# Patient Record
Sex: Male | Born: 1945 | Race: White | Hispanic: No | Marital: Married | Smoking: Former smoker
Health system: Southern US, Community
[De-identification: ages and names within clinical notes are randomized; demographics above are authoritative.]

## PROBLEM LIST (undated history)

## (undated) DIAGNOSIS — M2141 Flat foot [pes planus] (acquired), right foot: Secondary | ICD-10-CM

## (undated) DIAGNOSIS — Z860100 Personal history of colon polyps, unspecified: Secondary | ICD-10-CM

## (undated) DIAGNOSIS — C4359 Malignant melanoma of other part of trunk: Secondary | ICD-10-CM

## (undated) DIAGNOSIS — Z974 Presence of external hearing-aid: Secondary | ICD-10-CM

## (undated) DIAGNOSIS — M8580 Other specified disorders of bone density and structure, unspecified site: Secondary | ICD-10-CM

## (undated) DIAGNOSIS — Z8719 Personal history of other diseases of the digestive system: Secondary | ICD-10-CM

## (undated) DIAGNOSIS — I341 Nonrheumatic mitral (valve) prolapse: Secondary | ICD-10-CM

## (undated) DIAGNOSIS — I639 Cerebral infarction, unspecified: Secondary | ICD-10-CM

## (undated) DIAGNOSIS — M1990 Unspecified osteoarthritis, unspecified site: Secondary | ICD-10-CM

## (undated) DIAGNOSIS — Q2112 Patent foramen ovale: Secondary | ICD-10-CM

## (undated) DIAGNOSIS — Z9989 Dependence on other enabling machines and devices: Secondary | ICD-10-CM

## (undated) DIAGNOSIS — I340 Nonrheumatic mitral (valve) insufficiency: Secondary | ICD-10-CM

## (undated) DIAGNOSIS — Q242 Cor triatriatum: Secondary | ICD-10-CM

## (undated) DIAGNOSIS — M2142 Flat foot [pes planus] (acquired), left foot: Secondary | ICD-10-CM

## (undated) DIAGNOSIS — G4733 Obstructive sleep apnea (adult) (pediatric): Secondary | ICD-10-CM

## (undated) DIAGNOSIS — Z8639 Personal history of other endocrine, nutritional and metabolic disease: Secondary | ICD-10-CM

## (undated) DIAGNOSIS — Q211 Atrial septal defect: Secondary | ICD-10-CM

## (undated) DIAGNOSIS — H409 Unspecified glaucoma: Secondary | ICD-10-CM

## (undated) DIAGNOSIS — H903 Sensorineural hearing loss, bilateral: Secondary | ICD-10-CM

## (undated) DIAGNOSIS — N400 Enlarged prostate without lower urinary tract symptoms: Secondary | ICD-10-CM

## (undated) HISTORY — DX: Cor triatriatum: Q24.2

## (undated) HISTORY — PX: COLONOSCOPY: SHX174

## (undated) HISTORY — DX: Atrial septal defect: Q21.1

## (undated) HISTORY — PX: TONSILLECTOMY: SUR1361

## (undated) HISTORY — DX: Patent foramen ovale: Q21.12

---

## 1994-12-06 HISTORY — PX: CHOLECYSTECTOMY: SHX55

## 2000-05-10 ENCOUNTER — Encounter: Admission: RE | Admit: 2000-05-10 | Discharge: 2000-05-10 | Payer: Self-pay | Admitting: Family Medicine

## 2000-05-10 ENCOUNTER — Encounter: Payer: Self-pay | Admitting: Family Medicine

## 2001-10-05 ENCOUNTER — Encounter: Payer: Self-pay | Admitting: Emergency Medicine

## 2001-10-05 ENCOUNTER — Emergency Department (HOSPITAL_COMMUNITY): Admission: EM | Admit: 2001-10-05 | Discharge: 2001-10-05 | Payer: Self-pay | Admitting: Emergency Medicine

## 2001-12-06 HISTORY — PX: LASIK: SHX215

## 2002-07-23 ENCOUNTER — Ambulatory Visit (HOSPITAL_COMMUNITY): Admission: RE | Admit: 2002-07-23 | Discharge: 2002-07-23 | Payer: Self-pay | Admitting: General Surgery

## 2002-07-23 ENCOUNTER — Encounter: Payer: Self-pay | Admitting: General Surgery

## 2002-07-30 ENCOUNTER — Ambulatory Visit (HOSPITAL_COMMUNITY): Admission: RE | Admit: 2002-07-30 | Discharge: 2002-07-31 | Payer: Self-pay | Admitting: General Surgery

## 2002-07-30 ENCOUNTER — Encounter (INDEPENDENT_AMBULATORY_CARE_PROVIDER_SITE_OTHER): Payer: Self-pay | Admitting: Specialist

## 2002-12-06 HISTORY — PX: PARATHYROIDECTOMY: SHX19

## 2003-03-11 ENCOUNTER — Encounter: Admission: RE | Admit: 2003-03-11 | Discharge: 2003-03-11 | Payer: Self-pay | Admitting: Family Medicine

## 2003-03-11 ENCOUNTER — Encounter: Payer: Self-pay | Admitting: Family Medicine

## 2005-04-08 ENCOUNTER — Encounter: Admission: RE | Admit: 2005-04-08 | Discharge: 2005-04-08 | Payer: Self-pay | Admitting: Family Medicine

## 2007-04-19 ENCOUNTER — Encounter: Admission: RE | Admit: 2007-04-19 | Discharge: 2007-04-19 | Payer: Self-pay | Admitting: Family Medicine

## 2009-05-06 ENCOUNTER — Encounter: Admission: RE | Admit: 2009-05-06 | Discharge: 2009-05-06 | Payer: Self-pay | Admitting: Family Medicine

## 2011-04-23 NOTE — Op Note (Signed)
NAME:  James Tate, James Tate NO.:  [id number]   MEDICAL RECORD NO.:  [id number]                   PATIENT TYPE:  OIB   LOCATION:  5725                                 FACILITY:  [facility]   PHYSICIAN:  Angelia Mould. Derrell Lolling, M.D.             DATE OF BIRTH:  Apr 25, 1946   DATE OF PROCEDURE:  07/30/2002  DATE OF DISCHARGE:  07/31/2002                                 OPERATIVE REPORT   PREOPERATIVE DIAGNOSIS:  Primary hyperparathyroidism.   POSTOPERATIVE DIAGNOSIS:  Primary hyperparathyroidism.   PROCEDURE:  Minimally-invasive radionuclide-localized parathyroidectomy,  frozen section.   SURGEON:  Angelia Mould. Derrell Lolling, M.D.   ASSISTANT:  Sandria Bales. Ezzard Standing, M.D.   ESTIMATED BLOOD LOSS:  About 30 cc.   COMPLICATIONS:  None.   INDICATIONS:  This is a 65 year old white man who has had progressive  osteoporosis for the past three years, and he has been on Fosamax for the  past three years.  His father and his brother had severe osteoporosis, but  there is no family history of endocrine neoplasia syndrome.  Calcium has  been progressively rising, being 10.9 in 2/02, 11.6 in 3/03, and up to 12.4  on 07/02/02.  Parathormone levels have been significantly elevated, three to  four times normal.  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  He has noted some mild confusion of  right and left but not bad.  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   DESCRIPTION OF PROCEDURE:  Following the induction of general endotracheal  anesthesia, the patient was positioned with his neck extended.  He had  undergone a radionuclide injection 1 hour 15 minutes prior to the surgery.  The neck was prepped and draped in a sterile fashion.   We used the Neoprobe, and we were able to localize increased activity on the  left side slightly superiorly, about twice the background activity.   We  marked our incision and made a transverse incision about two fingerbreadths  above the left clavicle on the left side only.  The incision was about 4 cm  in length.  Dissection was carried down through the platysma muscle.  Platysma muscle flaps were elevated superiorly and inferiorly using cautery  and blunt dissection.  Strap muscles were identified.  The strap muscles  were divided in the midline and the left strap muscles were retracted  laterally and dissected away from the thyroid gland with sharp and blunt  dissection.  Using blunt dissection we mobilized the left thyroid lobe  medially.  We used the Neoprobe from time to time to localize the adenoma.  We ultimately found a large adenoma on the left side.  There was a small  cyst on the anterior cyst which ruptured during the dissection.  We  dissected out the gland, controlling the venous channels with metal  clips.  We found one arterial feeding vessel to the parathyroid adenoma, and this  was controlled with metal clips and divided.  The gland was sent to the lab.   Dr. Tammi Sou examined the tissue, stated that it was 1.3 cm diameter  parathyroid tissue consistent with an adenoma.  We felt that this was  consistent with the patient's parathyroid adenoma.   The wound was irrigated with saline.  We used the Neoprobe and found that  the elevated activity was gone and all of the counts on both sides were back  to baseline background activity.  A small piece of Surgicel was placed in  the wound.  We observed it for about 10 minutes while the frozen section was  being done.  There was no bleeding whatsoever.  Strap muscles were closed in  the midline with interrupted sutures of 3-0 Vicryl.  Platysma muscle was  closed with interrupted sutures of 3-0 Vicryl.  Skin was closed with Steri-  Strips and skin staples.  Clean bandages were placed and the patient taken  to the recovery room in stable condition.  Sponge, needle, and  instrument  counts were correct.                                               Angelia Mould. Derrell Lolling, M.D.    HMI/MEDQ  D:  07/30/2002  T:  08/02/2002  Job:  [id number]   cc:   Bryan Lemma. Manus Gunning, M.D.  [address] [place]  [place] [address]  Fax: [phone]   [place] [address] Ocie Cornfield, M.D., [address]

## 2012-06-21 ENCOUNTER — Other Ambulatory Visit: Payer: Self-pay | Admitting: Otolaryngology

## 2012-06-21 DIAGNOSIS — D497 Neoplasm of unspecified behavior of endocrine glands and other parts of nervous system: Secondary | ICD-10-CM

## 2012-06-27 ENCOUNTER — Ambulatory Visit
Admission: RE | Admit: 2012-06-27 | Discharge: 2012-06-27 | Disposition: A | Discharge status: Home / Self Care | Payer: BC Managed Care – PPO | Source: Ambulatory Visit | Attending: Otolaryngology | Admitting: Otolaryngology

## 2012-06-27 DIAGNOSIS — D497 Neoplasm of unspecified behavior of endocrine glands and other parts of nervous system: Secondary | ICD-10-CM

## 2013-04-12 DIAGNOSIS — R972 Elevated prostate specific antigen [PSA]: Secondary | ICD-10-CM | POA: Diagnosis not present

## 2013-04-19 DIAGNOSIS — R972 Elevated prostate specific antigen [PSA]: Secondary | ICD-10-CM | POA: Diagnosis not present

## 2013-04-19 DIAGNOSIS — N400 Enlarged prostate without lower urinary tract symptoms: Secondary | ICD-10-CM | POA: Diagnosis not present

## 2013-04-20 ENCOUNTER — Other Ambulatory Visit (HOSPITAL_COMMUNITY): Payer: Self-pay | Admitting: Urology

## 2013-04-20 DIAGNOSIS — R972 Elevated prostate specific antigen [PSA]: Secondary | ICD-10-CM

## 2013-05-01 ENCOUNTER — Other Ambulatory Visit (HOSPITAL_COMMUNITY): Payer: BC Managed Care – PPO

## 2013-06-19 ENCOUNTER — Other Ambulatory Visit (HOSPITAL_COMMUNITY): Payer: Self-pay | Admitting: Urology

## 2013-06-19 DIAGNOSIS — R972 Elevated prostate specific antigen [PSA]: Secondary | ICD-10-CM

## 2013-06-21 DIAGNOSIS — Z13220 Encounter for screening for lipoid disorders: Secondary | ICD-10-CM | POA: Diagnosis not present

## 2013-06-21 DIAGNOSIS — N529 Male erectile dysfunction, unspecified: Secondary | ICD-10-CM | POA: Diagnosis not present

## 2013-06-21 DIAGNOSIS — Z23 Encounter for immunization: Secondary | ICD-10-CM | POA: Diagnosis not present

## 2013-06-21 DIAGNOSIS — M899 Disorder of bone, unspecified: Secondary | ICD-10-CM | POA: Diagnosis not present

## 2013-06-21 DIAGNOSIS — Z0000 Encounter for general adult medical examination without abnormal findings: Secondary | ICD-10-CM | POA: Diagnosis not present

## 2013-06-21 DIAGNOSIS — B079 Viral wart, unspecified: Secondary | ICD-10-CM | POA: Diagnosis not present

## 2013-06-21 DIAGNOSIS — Z86010 Personal history of colonic polyps: Secondary | ICD-10-CM | POA: Diagnosis not present

## 2013-06-21 DIAGNOSIS — Z136 Encounter for screening for cardiovascular disorders: Secondary | ICD-10-CM | POA: Diagnosis not present

## 2013-06-21 DIAGNOSIS — Z131 Encounter for screening for diabetes mellitus: Secondary | ICD-10-CM | POA: Diagnosis not present

## 2013-06-25 ENCOUNTER — Inpatient Hospital Stay (HOSPITAL_COMMUNITY): Admission: RE | Admit: 2013-06-25 | Payer: BC Managed Care – PPO | Source: Ambulatory Visit

## 2013-06-29 ENCOUNTER — Ambulatory Visit (HOSPITAL_COMMUNITY)
Admission: RE | Admit: 2013-06-29 | Discharge: 2013-06-29 | Disposition: A | Discharge status: Home / Self Care | Payer: BC Managed Care – PPO | Source: Ambulatory Visit | Attending: Urology | Admitting: Urology

## 2013-06-29 DIAGNOSIS — N400 Enlarged prostate without lower urinary tract symptoms: Secondary | ICD-10-CM | POA: Insufficient documentation

## 2013-06-29 DIAGNOSIS — N402 Nodular prostate without lower urinary tract symptoms: Secondary | ICD-10-CM | POA: Insufficient documentation

## 2013-06-29 DIAGNOSIS — R972 Elevated prostate specific antigen [PSA]: Secondary | ICD-10-CM | POA: Insufficient documentation

## 2013-06-29 LAB — CREATININE, SERUM
Creatinine, Ser: 0.89 mg/dL (ref 0.50–1.35)
GFR calc Af Amer: >90 mL/min (ref >90)
GFR calc non Af Amer: 87 mL/min — ABNORMAL LOW (ref >90)

## 2013-06-29 MED ORDER — GADOBENATE DIMEGLUMINE 529 MG/ML IV SOLN
20.0000 mL | Freq: Once | INTRAVENOUS | Status: AC | PRN
  Administered 2013-06-29: 17 mL via INTRAVENOUS

## 2013-07-24 DIAGNOSIS — K648 Other hemorrhoids: Secondary | ICD-10-CM | POA: Diagnosis not present

## 2013-07-24 DIAGNOSIS — Z86010 Personal history of colonic polyps: Secondary | ICD-10-CM | POA: Diagnosis not present

## 2013-07-24 DIAGNOSIS — Z09 Encounter for follow-up examination after completed treatment for conditions other than malignant neoplasm: Secondary | ICD-10-CM | POA: Diagnosis not present

## 2013-07-25 DIAGNOSIS — M899 Disorder of bone, unspecified: Secondary | ICD-10-CM | POA: Diagnosis not present

## 2013-07-27 DIAGNOSIS — Z136 Encounter for screening for cardiovascular disorders: Secondary | ICD-10-CM | POA: Diagnosis not present

## 2013-09-07 DIAGNOSIS — B356 Tinea cruris: Secondary | ICD-10-CM | POA: Diagnosis not present

## 2013-09-07 DIAGNOSIS — B351 Tinea unguium: Secondary | ICD-10-CM | POA: Diagnosis not present

## 2013-09-07 DIAGNOSIS — B079 Viral wart, unspecified: Secondary | ICD-10-CM | POA: Diagnosis not present

## 2013-09-26 DIAGNOSIS — Z23 Encounter for immunization: Secondary | ICD-10-CM | POA: Diagnosis not present

## 2013-10-18 DIAGNOSIS — R972 Elevated prostate specific antigen [PSA]: Secondary | ICD-10-CM | POA: Diagnosis not present

## 2013-10-25 DIAGNOSIS — R972 Elevated prostate specific antigen [PSA]: Secondary | ICD-10-CM | POA: Diagnosis not present

## 2013-10-25 DIAGNOSIS — N400 Enlarged prostate without lower urinary tract symptoms: Secondary | ICD-10-CM | POA: Diagnosis not present

## 2014-04-18 DIAGNOSIS — R972 Elevated prostate specific antigen [PSA]: Secondary | ICD-10-CM | POA: Diagnosis not present

## 2014-04-25 DIAGNOSIS — R972 Elevated prostate specific antigen [PSA]: Secondary | ICD-10-CM | POA: Diagnosis not present

## 2014-04-25 DIAGNOSIS — N529 Male erectile dysfunction, unspecified: Secondary | ICD-10-CM | POA: Diagnosis not present

## 2014-04-25 DIAGNOSIS — N400 Enlarged prostate without lower urinary tract symptoms: Secondary | ICD-10-CM | POA: Diagnosis not present

## 2014-06-13 DIAGNOSIS — R51 Headache: Secondary | ICD-10-CM | POA: Diagnosis not present

## 2014-06-13 DIAGNOSIS — R5381 Other malaise: Secondary | ICD-10-CM | POA: Diagnosis not present

## 2014-06-18 DIAGNOSIS — L02619 Cutaneous abscess of unspecified foot: Secondary | ICD-10-CM | POA: Diagnosis not present

## 2014-08-22 DIAGNOSIS — M722 Plantar fascial fibromatosis: Secondary | ICD-10-CM | POA: Diagnosis not present

## 2014-08-22 DIAGNOSIS — M899 Disorder of bone, unspecified: Secondary | ICD-10-CM | POA: Diagnosis not present

## 2014-08-22 DIAGNOSIS — Z86010 Personal history of colonic polyps: Secondary | ICD-10-CM | POA: Diagnosis not present

## 2014-08-22 DIAGNOSIS — D485 Neoplasm of uncertain behavior of skin: Secondary | ICD-10-CM | POA: Diagnosis not present

## 2014-08-22 DIAGNOSIS — M949 Disorder of cartilage, unspecified: Secondary | ICD-10-CM | POA: Diagnosis not present

## 2014-08-22 DIAGNOSIS — E7800 Pure hypercholesterolemia, unspecified: Secondary | ICD-10-CM | POA: Diagnosis not present

## 2014-08-22 DIAGNOSIS — Z23 Encounter for immunization: Secondary | ICD-10-CM | POA: Diagnosis not present

## 2014-08-22 DIAGNOSIS — Z0000 Encounter for general adult medical examination without abnormal findings: Secondary | ICD-10-CM | POA: Diagnosis not present

## 2014-08-22 DIAGNOSIS — N529 Male erectile dysfunction, unspecified: Secondary | ICD-10-CM | POA: Diagnosis not present

## 2014-09-10 ENCOUNTER — Other Ambulatory Visit: Payer: Self-pay | Admitting: Family Medicine

## 2014-09-10 DIAGNOSIS — N529 Male erectile dysfunction, unspecified: Secondary | ICD-10-CM | POA: Diagnosis not present

## 2014-09-10 DIAGNOSIS — C4359 Malignant melanoma of other part of trunk: Secondary | ICD-10-CM | POA: Diagnosis not present

## 2014-09-10 DIAGNOSIS — D485 Neoplasm of uncertain behavior of skin: Secondary | ICD-10-CM | POA: Diagnosis not present

## 2014-09-18 DIAGNOSIS — C4359 Malignant melanoma of other part of trunk: Secondary | ICD-10-CM | POA: Diagnosis not present

## 2014-09-20 NOTE — Progress Notes (Signed)
Need orders for 10-04-14 surgery in epic, pre op is 09-26-14

## 2014-09-23 ENCOUNTER — Other Ambulatory Visit (INDEPENDENT_AMBULATORY_CARE_PROVIDER_SITE_OTHER): Payer: Self-pay | Admitting: General Surgery

## 2014-09-23 ENCOUNTER — Encounter (HOSPITAL_COMMUNITY): Payer: Self-pay | Admitting: Pharmacy Technician

## 2014-09-26 ENCOUNTER — Encounter (HOSPITAL_COMMUNITY): Payer: Self-pay

## 2014-09-26 ENCOUNTER — Encounter (INDEPENDENT_AMBULATORY_CARE_PROVIDER_SITE_OTHER): Payer: Self-pay

## 2014-09-26 ENCOUNTER — Encounter (HOSPITAL_COMMUNITY)
Admission: RE | Admit: 2014-09-26 | Discharge: 2014-09-26 | Disposition: A | Discharge status: Home / Self Care | Payer: Medicare Other | Source: Ambulatory Visit | Attending: General Surgery | Admitting: General Surgery

## 2014-09-26 DIAGNOSIS — Z01812 Encounter for preprocedural laboratory examination: Secondary | ICD-10-CM | POA: Insufficient documentation

## 2014-09-26 HISTORY — DX: Flat foot (pes planus) (acquired), left foot: M21.41

## 2014-09-26 HISTORY — DX: Flat foot (pes planus) (acquired), right foot: M21.42

## 2014-09-26 HISTORY — DX: Benign prostatic hyperplasia without lower urinary tract symptoms: N40.0

## 2014-09-26 HISTORY — DX: Other specified disorders of bone density and structure, unspecified site: M85.80

## 2014-09-26 LAB — CBC WITH DIFFERENTIAL/PLATELET
Basophils Absolute: 0.1 10*3/uL (ref 0.0–0.1)
Basophils Relative: 1 % (ref 0–1)
EOS ABS: 0.4 10*3/uL (ref 0.0–0.7)
Eosinophils Relative: 6 % — ABNORMAL HIGH (ref 0–5)
HCT: 42.6 % (ref 39.0–52.0)
HEMOGLOBIN: 14.5 g/dL (ref 13.0–17.0)
LYMPHS ABS: 1.5 10*3/uL (ref 0.7–4.0)
LYMPHS PCT: 23 % (ref 12–46)
MCH: 29.5 pg (ref 26.0–34.0)
MCHC: 34 g/dL (ref 30.0–36.0)
MCV: 86.8 fL (ref 78.0–100.0)
MONOS PCT: 14 % — AB (ref 3–12)
Monocytes Absolute: 0.9 10*3/uL (ref 0.1–1.0)
NEUTROS PCT: 56 % (ref 43–77)
Neutro Abs: 3.6 10*3/uL (ref 1.7–7.7)
PLATELETS: 233 10*3/uL (ref 150–400)
RBC: 4.91 MIL/uL (ref 4.22–5.81)
RDW: 12.5 % (ref 11.5–15.5)
WBC: 6.5 10*3/uL (ref 4.0–10.5)

## 2014-09-26 LAB — COMPREHENSIVE METABOLIC PANEL
ALK PHOS: 70 U/L (ref 39–117)
ALT: 23 U/L (ref 0–53)
ANION GAP: 12 (ref 5–15)
AST: 34 U/L (ref 0–37)
Albumin: 4 g/dL (ref 3.5–5.2)
BILIRUBIN TOTAL: 1.5 mg/dL — AB (ref 0.3–1.2)
BUN: 17 mg/dL (ref 6–23)
CO2: 26 meq/L (ref 19–32)
Calcium: 9.7 mg/dL (ref 8.4–10.5)
Chloride: 100 mEq/L (ref 96–112)
Creatinine, Ser: 0.85 mg/dL (ref 0.50–1.35)
GFR, EST NON AFRICAN AMERICAN: 88 mL/min — AB (ref >90)
GLUCOSE: 93 mg/dL (ref 70–99)
POTASSIUM: 4.5 meq/L (ref 3.7–5.3)
SODIUM: 138 meq/L (ref 137–147)
Total Protein: 7.6 g/dL (ref 6.0–8.3)

## 2014-09-26 NOTE — Patient Instructions (Addendum)
Derwood Becraft Rhyner  09/26/2014                           YOUR PROCEDURE IS SCHEDULED ON:  10/04/14                ENTER FROM [address]  / ENTER THRU EMERGENCY ENTRANCE                               FOLLOW  SIGNS TO SHORT STAY CENTER                 ARRIVE AT SHORT STAY AT: 5:30 AM               CALL THIS NUMBER IF ANY PROBLEMS THE DAY OF SURGERY :               [phone]                                REMEMBER:   Do not eat food or drink liquids AFTER MIDNIGHT              DISCONTINUE ASPIRIN AND HERBAL MEDS 5 DAYS PREOP                  Take these medicines the morning of surgery with               A SIPS OF WATER :    NONE     Do not wear jewelry, make-up   Do not wear lotions, powders, or perfumes.   Do not shave legs or underarms 12 hrs. before surgery (men may shave face)  Do not bring valuables to the hospital.  Contacts, dentures or bridgework may not be worn into surgery.  Leave suitcase in the car. After surgery it may be brought to your room.  For patients admitted to the hospital more than one night, checkout time is            11:00 AM                                                       The day of discharge.   Patients discharged the day of surgery will not be allowed to drive home.            If going home same day of surgery, must have someone stay with you              FIRST 24 hrs at home and arrange for some one to drive you              home from hospital.   ________________________________________________________________________  Portal  Before surgery, you can play an important role.  Because skin is not sterile, your skin needs to be as free of germs as possible.  You can reduce the number of germs on your skin by washing with CHG (chlorahexidine gluconate) soap before surgery.  CHG is an antiseptic cleaner  which kills germs and bonds with the skin to continue killing germs even after washing. Please DO NOT use if you have an allergy to CHG or antibacterial soaps.  If your skin becomes reddened/irritated stop using the CHG and inform your nurse when you arrive at Short Stay. Do not shave (including legs and underarms) for at least 48 hours prior to the first CHG shower.  You may shave your face. Please follow these instructions carefully:   1.  Shower with CHG Soap the night before surgery and the  morning of Surgery.   2.  If you choose to wash your hair, wash your hair first as usual with your  normal  Shampoo.   3.  After you shampoo, rinse your hair and body thoroughly to remove the  shampoo.                                         4.  Use CHG as you would any other liquid soap.  You can apply chg directly  to the skin and wash . Gently wash with scrungie or clean wascloth    5.  Apply the CHG Soap to your body ONLY FROM THE NECK DOWN.   Do not use on open                           Wound or open sores. Avoid contact with eyes, ears mouth and genitals (private parts).                        Genitals (private parts) with your normal soap.              6.  Wash thoroughly, paying special attention to the area where your surgery  will be performed.   7.  Thoroughly rinse your body with warm water from the neck down.   8.  DO NOT shower/wash with your normal soap after using and rinsing off  the CHG Soap .                9.  Pat yourself dry with a clean towel.             10.  Wear clean pajamas.             11.  Place clean sheets on your bed the night of your first shower and do not  sleep with pets.  Day of Surgery : Do not apply any lotions/deodorants the morning of surgery.  Please wear clean clothes to the hospital/surgery center.  FAILURE TO FOLLOW THESE INSTRUCTIONS MAY RESULT IN THE CANCELLATION OF YOUR SURGERY    PATIENT  SIGNATURE_________________________________  ______________________________________________________________________

## 2014-10-03 NOTE — Anesthesia Preprocedure Evaluation (Addendum)
Anesthesia Evaluation  Patient identified by MRN, date of birth, ID band Patient awake    Reviewed: Allergy & Precautions, H&P , NPO status , Patient's Chart, lab work & pertinent test results  History of Anesthesia Complications Negative for: history of anesthetic complications  Airway Mallampati: II  TM Distance: >3 FB Neck ROM: Full    Dental no notable dental hx. (+) Teeth Intact, Dental Advisory Given   Pulmonary former smoker,  breath sounds clear to auscultation  Pulmonary exam normal       Cardiovascular negative cardio ROS  Rhythm:Regular Rate:Normal     Neuro/Psych negative neurological ROS  negative psych ROS   GI/Hepatic negative GI ROS, Neg liver ROS,   Endo/Other  negative endocrine ROS  Renal/GU negative Renal ROS  negative genitourinary   Musculoskeletal negative musculoskeletal ROS (+)   Abdominal   Peds negative pediatric ROS (+)  Hematology negative hematology ROS (+)   Anesthesia Other Findings Melanoma on back  Reproductive/Obstetrics negative OB ROS                            Anesthesia Physical Anesthesia Plan  ASA: II  Anesthesia Plan: General   Post-op Pain Management:    Induction: Intravenous  Airway Management Planned: Oral ETT and LMA  Additional Equipment:   Intra-op Plan:   Post-operative Plan: Extubation in OR  Informed Consent: I have reviewed the patients History and Physical, chart, labs and discussed the procedure including the risks, benefits and alternatives for the proposed anesthesia with the patient or authorized representative who has indicated his/her understanding and acceptance.   Dental advisory given  Plan Discussed with: CRNA  Anesthesia Plan Comments:         Anesthesia Quick Evaluation

## 2014-10-04 ENCOUNTER — Ambulatory Visit (HOSPITAL_COMMUNITY)
Admission: RE | Admit: 2014-10-04 | Discharge: 2014-10-04 | Disposition: A | Discharge status: Home / Self Care | Payer: Medicare Other | Source: Ambulatory Visit | Attending: General Surgery | Admitting: General Surgery

## 2014-10-04 ENCOUNTER — Encounter (HOSPITAL_COMMUNITY)
Admission: RE | Disposition: A | Discharge status: Home / Self Care | Payer: Self-pay | Source: Ambulatory Visit | Attending: General Surgery

## 2014-10-04 ENCOUNTER — Encounter (HOSPITAL_COMMUNITY): Payer: Self-pay | Admitting: *Deleted

## 2014-10-04 ENCOUNTER — Ambulatory Visit (HOSPITAL_COMMUNITY): Payer: Medicare Other | Admitting: Anesthesiology

## 2014-10-04 ENCOUNTER — Encounter (HOSPITAL_COMMUNITY): Payer: Medicare Other | Admitting: Anesthesiology

## 2014-10-04 DIAGNOSIS — N400 Enlarged prostate without lower urinary tract symptoms: Secondary | ICD-10-CM | POA: Insufficient documentation

## 2014-10-04 DIAGNOSIS — L905 Scar conditions and fibrosis of skin: Secondary | ICD-10-CM | POA: Diagnosis not present

## 2014-10-04 DIAGNOSIS — C4359 Malignant melanoma of other part of trunk: Secondary | ICD-10-CM | POA: Diagnosis not present

## 2014-10-04 DIAGNOSIS — L821 Other seborrheic keratosis: Secondary | ICD-10-CM | POA: Diagnosis not present

## 2014-10-04 DIAGNOSIS — D0359 Melanoma in situ of other part of trunk: Secondary | ICD-10-CM | POA: Diagnosis not present

## 2014-10-04 DIAGNOSIS — Z87891 Personal history of nicotine dependence: Secondary | ICD-10-CM | POA: Diagnosis not present

## 2014-10-04 HISTORY — PX: MELANOMA EXCISION: SHX5266

## 2014-10-04 SURGERY — EXCISION, MELANOMA
Anesthesia: General | Site: Back

## 2014-10-04 MED ORDER — BACITRACIN ZINC 500 UNIT/GM EX OINT
TOPICAL_OINTMENT | CUTANEOUS | Status: AC
  Filled 2014-10-04: qty 28.35

## 2014-10-04 MED ORDER — FENTANYL CITRATE 0.05 MG/ML IJ SOLN: 25.0000 ug | INTRAMUSCULAR | Status: DC | PRN

## 2014-10-04 MED ORDER — SODIUM CHLORIDE 0.9 % IJ SOLN: 3.0000 mL | Freq: Two times a day (BID) | INTRAMUSCULAR | Status: DC

## 2014-10-04 MED ORDER — EPHEDRINE SULFATE 50 MG/ML IJ SOLN
INTRAMUSCULAR | Status: DC | PRN
  Administered 2014-10-04 (×2): 5 mg via INTRAVENOUS

## 2014-10-04 MED ORDER — BACITRACIN ZINC 500 UNIT/GM EX OINT
TOPICAL_OINTMENT | CUTANEOUS | Status: DC | PRN
  Administered 2014-10-04: 1 via TOPICAL

## 2014-10-04 MED ORDER — OXYCODONE HCL 5 MG PO TABS: 5.0000 mg | ORAL_TABLET | ORAL | Status: DC | PRN

## 2014-10-04 MED ORDER — LIDOCAINE HCL (CARDIAC) 20 MG/ML IV SOLN
INTRAVENOUS | Status: DC | PRN
  Administered 2014-10-04: 50 mg via INTRAVENOUS

## 2014-10-04 MED ORDER — NEOSTIGMINE METHYLSULFATE 10 MG/10ML IV SOLN
INTRAVENOUS | Status: AC
  Filled 2014-10-04: qty 1

## 2014-10-04 MED ORDER — CEFAZOLIN SODIUM-DEXTROSE 2-3 GM-% IV SOLR
2.0000 g | INTRAVENOUS | Status: AC
Start: 2014-10-04 — End: 2014-10-04
  Administered 2014-10-04: 2 g via INTRAVENOUS

## 2014-10-04 MED ORDER — FENTANYL CITRATE 0.05 MG/ML IJ SOLN
INTRAMUSCULAR | Status: AC
  Filled 2014-10-04: qty 2

## 2014-10-04 MED ORDER — KETOROLAC TROMETHAMINE 30 MG/ML IJ SOLN
INTRAMUSCULAR | Status: DC | PRN
  Administered 2014-10-04: 30 mg via INTRAVENOUS

## 2014-10-04 MED ORDER — GLYCOPYRROLATE 0.2 MG/ML IJ SOLN
INTRAMUSCULAR | Status: DC | PRN
  Administered 2014-10-04: 0.6 mg via INTRAVENOUS

## 2014-10-04 MED ORDER — SODIUM CHLORIDE 0.9 % IR SOLN
Status: DC | PRN
  Administered 2014-10-04: 08:00:00

## 2014-10-04 MED ORDER — ONDANSETRON HCL 4 MG/2ML IJ SOLN
INTRAMUSCULAR | Status: AC
  Filled 2014-10-04: qty 2

## 2014-10-04 MED ORDER — GLYCOPYRROLATE 0.2 MG/ML IJ SOLN
INTRAMUSCULAR | Status: AC
  Filled 2014-10-04: qty 3

## 2014-10-04 MED ORDER — PROPOFOL 10 MG/ML IV BOLUS
INTRAVENOUS | Status: AC
  Filled 2014-10-04: qty 20

## 2014-10-04 MED ORDER — ACETAMINOPHEN 650 MG RE SUPP
650.0000 mg | RECTAL | Status: DC | PRN
  Filled 2014-10-04: qty 1

## 2014-10-04 MED ORDER — ONDANSETRON HCL 4 MG/2ML IJ SOLN: 4.0000 mg | Freq: Once | INTRAMUSCULAR | Status: DC | PRN

## 2014-10-04 MED ORDER — SODIUM CHLORIDE 0.9 % IJ SOLN: 3.0000 mL | INTRAMUSCULAR | Status: DC | PRN

## 2014-10-04 MED ORDER — ROCURONIUM BROMIDE 100 MG/10ML IV SOLN
INTRAVENOUS | Status: DC | PRN
  Administered 2014-10-04: 45 mg via INTRAVENOUS

## 2014-10-04 MED ORDER — LIDOCAINE HCL (CARDIAC) 20 MG/ML IV SOLN
INTRAVENOUS | Status: AC
  Filled 2014-10-04: qty 5

## 2014-10-04 MED ORDER — 0.9 % SODIUM CHLORIDE (POUR BTL) OPTIME
TOPICAL | Status: DC | PRN
  Administered 2014-10-04: 1000 mL

## 2014-10-04 MED ORDER — BUPIVACAINE-EPINEPHRINE 0.25% -1:200000 IJ SOLN
INTRAMUSCULAR | Status: DC | PRN
  Administered 2014-10-04: 30 mL

## 2014-10-04 MED ORDER — OXYCODONE HCL 5 MG PO TABS
5.0000 mg | ORAL_TABLET | ORAL | Status: DC | PRN
  Administered 2014-10-04: 5 mg via ORAL
  Filled 2014-10-04: qty 1

## 2014-10-04 MED ORDER — SODIUM BICARBONATE 4 % IV SOLN
INTRAVENOUS | Status: AC
  Filled 2014-10-04: qty 5

## 2014-10-04 MED ORDER — CHLORHEXIDINE GLUCONATE 4 % EX LIQD: 1.0000 "application " | Freq: Once | CUTANEOUS | Status: DC

## 2014-10-04 MED ORDER — LACTATED RINGERS IV SOLN: INTRAVENOUS | Status: DC

## 2014-10-04 MED ORDER — ONDANSETRON HCL 4 MG/2ML IJ SOLN
INTRAMUSCULAR | Status: DC | PRN
  Administered 2014-10-04: 4 mg via INTRAVENOUS

## 2014-10-04 MED ORDER — EPHEDRINE SULFATE 50 MG/ML IJ SOLN
INTRAMUSCULAR | Status: AC
  Filled 2014-10-04: qty 1

## 2014-10-04 MED ORDER — CEFAZOLIN SODIUM-DEXTROSE 2-3 GM-% IV SOLR
INTRAVENOUS | Status: AC
Start: 2014-10-04 — End: 2014-10-04
  Filled 2014-10-04: qty 50

## 2014-10-04 MED ORDER — KETOROLAC TROMETHAMINE 30 MG/ML IJ SOLN
INTRAMUSCULAR | Status: AC
  Filled 2014-10-04: qty 1

## 2014-10-04 MED ORDER — PROPOFOL 10 MG/ML IV BOLUS
INTRAVENOUS | Status: DC | PRN
  Administered 2014-10-04: 180 mg via INTRAVENOUS

## 2014-10-04 MED ORDER — NEOSTIGMINE METHYLSULFATE 10 MG/10ML IV SOLN
INTRAVENOUS | Status: DC | PRN
  Administered 2014-10-04: 4 mg via INTRAVENOUS

## 2014-10-04 MED ORDER — SODIUM CHLORIDE 0.9 % IR SOLN
Status: AC
  Filled 2014-10-04: qty 1

## 2014-10-04 MED ORDER — ACETAMINOPHEN 325 MG PO TABS: 650.0000 mg | ORAL_TABLET | ORAL | Status: DC | PRN

## 2014-10-04 MED ORDER — MIDAZOLAM HCL 5 MG/5ML IJ SOLN
INTRAMUSCULAR | Status: DC | PRN
  Administered 2014-10-04: 2 mg via INTRAVENOUS

## 2014-10-04 MED ORDER — FENTANYL CITRATE 0.05 MG/ML IJ SOLN
INTRAMUSCULAR | Status: DC | PRN
  Administered 2014-10-04 (×2): 50 ug via INTRAVENOUS

## 2014-10-04 MED ORDER — BUPIVACAINE-EPINEPHRINE (PF) 0.25% -1:200000 IJ SOLN
INTRAMUSCULAR | Status: AC
  Filled 2014-10-04: qty 60

## 2014-10-04 MED ORDER — SODIUM CHLORIDE 0.9 % IV SOLN: 250.0000 mL | INTRAVENOUS | Status: DC | PRN

## 2014-10-04 MED ORDER — LACTATED RINGERS IV SOLN
INTRAVENOUS | Status: DC | PRN
  Administered 2014-10-04 (×2): via INTRAVENOUS

## 2014-10-04 MED ORDER — MIDAZOLAM HCL 2 MG/2ML IJ SOLN
INTRAMUSCULAR | Status: AC
  Filled 2014-10-04: qty 2

## 2014-10-04 SURGICAL SUPPLY — 37 items
BENZOIN TINCTURE PRP APPL 2/3 (GAUZE/BANDAGES/DRESSINGS) IMPLANT
BLADE HEX COATED 2.75 (ELECTRODE) ×3 IMPLANT
BLADE SURG 15 STRL LF DISP TIS (BLADE) ×1 IMPLANT
BLADE SURG 15 STRL SS (BLADE) ×2
BLADE SURG SZ10 CARB STEEL (BLADE) IMPLANT
CANISTER SUCTION 2500CC (MISCELLANEOUS) IMPLANT
COVER SURGICAL LIGHT HANDLE (MISCELLANEOUS) IMPLANT
DECANTER SPIKE VIAL GLASS SM (MISCELLANEOUS) IMPLANT
DRAPE LAPAROTOMY T 102X78X121 (DRAPES) IMPLANT
DRAPE LAPAROTOMY TRNSV 102X78 (DRAPE) IMPLANT
DRAPE PED LAPAROTOMY (DRAPES) IMPLANT
DRAPE SHEET LG 3/4 BI-LAMINATE (DRAPES) IMPLANT
DRAPE UTILITY XL STRL (DRAPES) ×3 IMPLANT
ELECT REM PT RETURN 9FT ADLT (ELECTROSURGICAL) ×3
ELECTRODE REM PT RTRN 9FT ADLT (ELECTROSURGICAL) ×1 IMPLANT
GAUZE PACKING IODOFORM 1/4X15 (GAUZE/BANDAGES/DRESSINGS) IMPLANT
GAUZE SPONGE 4X4 12PLY STRL (GAUZE/BANDAGES/DRESSINGS) ×3 IMPLANT
GLOVE BIO SURGEON STRL SZ7.5 (GLOVE) ×3 IMPLANT
GLOVE BIOGEL M STRL SZ7.5 (GLOVE) IMPLANT
GLOVE INDICATOR 8.0 STRL GRN (GLOVE) ×3 IMPLANT
GOWN STRL REUS W/TWL LRG LVL3 (GOWN DISPOSABLE) ×3 IMPLANT
GOWN STRL REUS W/TWL XL LVL3 (GOWN DISPOSABLE) ×6 IMPLANT
KIT BASIN OR (CUSTOM PROCEDURE TRAY) ×3 IMPLANT
MARKER SKIN DUAL TIP RULER LAB (MISCELLANEOUS) IMPLANT
NEEDLE HYPO 25X1 1.5 SAFETY (NEEDLE) ×3 IMPLANT
PACK BASIC VI WITH GOWN DISP (CUSTOM PROCEDURE TRAY) ×3 IMPLANT
PENCIL BUTTON HOLSTER BLD 10FT (ELECTRODE) ×3 IMPLANT
SPONGE LAP 18X18 X RAY DECT (DISPOSABLE) ×3 IMPLANT
SPONGE LAP 4X18 X RAY DECT (DISPOSABLE) IMPLANT
STAPLER VISISTAT 35W (STAPLE) IMPLANT
SUT ETHILON 2 0 PS N (SUTURE) ×9 IMPLANT
SUT MNCRL AB 4-0 PS2 18 (SUTURE) IMPLANT
SUT VIC AB 3-0 SH 18 (SUTURE) IMPLANT
SYR CONTROL 10ML LL (SYRINGE) ×3 IMPLANT
TAPE PAPER 3X10 WHT MICROPORE (GAUZE/BANDAGES/DRESSINGS) ×3 IMPLANT
TOWEL OR 17X26 10 PK STRL BLUE (TOWEL DISPOSABLE) ×3 IMPLANT
YANKAUER SUCT BULB TIP 10FT TU (MISCELLANEOUS) IMPLANT

## 2014-10-04 NOTE — Interval H&P Note (Signed)
History and Physical Interval Note:  10/04/2014 7:25 AM  James Tate  has presented today for surgery, with the diagnosis of Upper Back Melanoma  The various methods of treatment have been discussed with the patient and family. After consideration of risks, benefits and other options for treatment, the patient has consented to  Procedure(s): [place]  (N/A) as a surgical intervention .  The patient's history has been reviewed, patient examined, no change in status, stable for surgery.  I have reviewed the patient's chart and labs.  Questions were answered to the patient's satisfaction.     Kenney Houseman. Redmond Pulling, MD, FACS General, Bariatric, & Minimally Invasive Surgery [facility], [place]

## 2014-10-04 NOTE — Op Note (Signed)
10/04/2014  9:02 AM  PATIENT:  James Tate  68 y.o. 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)  POST-OPERATIVE DIAGNOSIS:  same  PROCEDURE:  Procedure(s): WIDE LOCAL EXCISION OF UPPER BACK MALIGNANT MELANOMA (12 cm x 3.5cm) (Melanoma size 1.5 cm x 1.5 cm, with adding a 1 cm circumferential margin this resulted in a skin excision of 12 cm x 3.5 cm)  SURGEON:  Surgeon(s): Gayland Curry, MD  ASSISTANTS: none   ANESTHESIA:   local and general  DRAINS: none   LOCAL MEDICATIONS USED:  MARCAINE     SPECIMEN:  Source of Specimen:  upper back melanoma, short stitch superior; long lateral  DISPOSITION OF SPECIMEN:  PATHOLOGY  COUNTS:  YES  INDICATION FOR PROCEDURE: 68 year old Caucasian male presented to his primary care physician with changing upper back mole. The dimensions of the lesion at that time were 0.5 x 1 cm. He underwent shave biopsy which revealed a T1a superficial spreading malignant melanoma. There is no ulceration. The thickness was 0.45 mm. There was less than 1 mitosis per field. He had positive deep and peripheral margins. He was referred to our office for surgical management. Since the thickness of the lesion was less than 1 mm and there was no ulceration I did not offer him sentinel lymph node biopsy. We discussed wide local excision. Because the lesion was approximately 0.45 mm in depth I recommended a 1 cm circumferential margin. We discussed the fact that this would require a larger incision so that we could bring the skin edges together. We discussed the risks and benefits of surgery including but not limited to bleeding, infection, wound breakdown, need for additional procedures, blood clot formation, scarring, anesthesia complications, seroma, hematoma. We discussed the typical postoperative course  PROCEDURE: After obtaining informed consent the patient was taken to operating room one that [facility] hospital. General  endotracheal anesthesia was established. Sequential compression devices were placed. He was then placed in the prone position on the operating room table with the appropriate padding underneath his pressure points. His upper back was prepped and draped in the usual standard surgical fashion. He received IV antibiotics prior to skin incision. A surgical timeout was performed. He had a healing wound from his shave biopsy. It had a scab on it. There is no significant cellulitis or induration or fluctuance. The size of the wound at this point measured 1.5 cm x 1.5 cm. I added a 1 cm margin circumferentially around it which gave [place] a width of around 3.5 x 3.5 cm. I then drew an elliptical incision with a marking pen so that we could approximate the wound edges after excision. This gave a total incision length of 12 cm long by 3-1/2 cm wide. Local was infiltrated along the planned incision. Then using a 15 blade sharply incised and made an elliptical incision through my previously placed outline. The deep dermis directly around the melanoma site was divided sharply with the blade. Out laterally the deep dermis was divided with electrocautery. It should be noted that on the medial aspect of the incision I did get a little bit less than 1 cm therefore I incorporated a little bit more skin out laterally. I was able to keep this attached to the initial specimen. The subcutaneous tissue was divided with electrocautery and the specimen was free. It was marked as described above. I then raised skin edges by releasing some of the subcutaneous fat along the length of the incision.  Hemostasis was achieved. I then infiltrated additional local. I also irrigated the wound with antibiotic irrigation. I then reapproximated the incision with interrupted 2-0 nylon sutures in a vertical mattress fashion. It took 13 sutures to reapproximate the incision. Triple antibiotic ointment was applied along with sterile gauze and tape. The patient  was then placed in the supine position and extubated and taken to the recovery room in stable condition. All needle, instrument, and sponge counts were correct 2. There were no immediate complications.  PLAN OF CARE: Discharge to home after PACU  PATIENT DISPOSITION:  PACU - hemodynamically stable.   Delay start of Pharmacological VTE agent (>24hrs) due to surgical blood loss or risk of bleeding:  not applicable  Leighton Ruff. Redmond Pulling, MD, FACS General, Bariatric, & Minimally Invasive Surgery [facility], [place]

## 2014-10-04 NOTE — Discharge Instructions (Signed)
[facility]   POST OP INSTRUCTIONS  Always review your discharge instruction sheet given to you by the facility where your surgery was performed. IF YOU HAVE DISABILITY OR FAMILY LEAVE FORMS, YOU MUST BRING THEM TO THE OFFICE FOR PROCESSING.   DO NOT GIVE THEM TO YOUR DOCTOR.  1. A  prescription for pain medication may be given to you upon discharge.  Take your pain medication as prescribed, if needed.  If narcotic pain medicine is not needed, then you may take acetaminophen (Tylenol) or ibuprofen (Advil) as needed. 2. Take your usually prescribed medications unless otherwise directed. 3. If you need a refill on your pain medication, please contact your pharmacy.  They will contact our office to request authorization. Prescriptions will not be filled after 5 pm or on week-ends. 4. You should follow a light diet the first 24 hours after arrival home, such as soup and crackers, etc.  Be sure to include lots of fluids daily.  Resume your normal diet the day after surgery. 5. Most patients will experience some swelling and bruising around the incision.  Ice packs and reclining will help.  Swelling and bruising can take several days to resolve.  6. It is common to experience some constipation if taking pain medication after surgery.  Increasing fluid intake and taking a stool softener (such as Colace) will usually help or prevent this problem from occurring.  A mild laxative (Milk of Magnesia or Miralax) should be taken according to package directions if there are no bowel movements after 48 hours. 7. May remove dressing on Sunday. May shower at that point. Pat wound dry. Apply triple antibiotic ointment (bacitracin) along length of incision.   Cover with dry gauze and some tape. Change daily and repeat process. sutures will be removed at the office during your follow-up visit with the nurse. Expect some drainage the first couple of days. You may change the bandage on Saturday if you  need to (if the bandage is covered with drainage) 8. ACTIVITIES:  You may resume regular (light) daily activities beginning the next day--such as daily self-care, walking, climbing stairs--gradually increasing activities as tolerated.  You may have sexual intercourse when it is comfortable.  Refrain from any heavy lifting or straining until approved by your doctor. a. You may drive when you are no longer taking prescription pain medication, you can comfortably wear a seatbelt, and you can safely maneuver your car and apply brakes. b. RETURN TO WORK:  9. You should see your doctor in the office for a follow-up appointment approximately 2-3 weeks after your surgery.  Make sure that you call for this appointment within a day or two after you arrive home to insure a convenient appointment time. 10. OTHER INSTRUCTIONS:    WHEN TO CALL YOUR DOCTOR: 1. Fever over 101.0 2. Inability to urinate 3. Nausea and/or vomiting 4. Extreme swelling or bruising 5. Continued bleeding from incision. 6. Increased pain, redness, or drainage from the incision  The clinic staff is available to answer your questions during regular business hours.  Please dont hesitate to call and ask to speak to one of the nurses for clinical concerns.  If you have a medical emergency, go to the nearest emergency room or call 911.  A surgeon from [facility] is always on call at the hospital   [address], [place], [place], [place]  [address] ?  [address], [place], [place]   [address] [phone] ? [phone] ?  FAX [phone] Web site: [email or web address]      Excision of Skin Lesions Excision of a skin lesion refers to the removal of a section of skin by making small cuts (incisions) in the skin. This is typically done to remove a cancerous growth (basal cell carcinoma, squamous cell carcinoma, or melanoma) or a noncancerous growth (cyst). It may be done to treat or prevent cancer or  infection. It may also be done to improve cosmetic appearance (removal of mole, skin tag).  RISKS AND COMPLICATIONS  Many complications can be managed. With appropriate treatment and rehabilitation, the following complications are very uncommon:  Bleeding.  Infection.  Scarring.  Recurrence of cyst or cancer.  Changes in skin sensation or appearance (discoloration, swelling).  Reaction to anesthesia.  Allergic reaction to surgical materials or ointments.  Damage to nerves, blood vessels, muscles, or other structures.  Continued pain.  PROCEDURE  There are several excision techniques. The type of excision or surgical technique used will depend on your condition, the location of the lesion, and your overall health. After the lesion is sterilized and a local anesthetic is applied, the following may be performed: Complete surgical excision The area to be removed is marked with a pen. Using a small scalpel and scissors, the surgeon gently cuts around and under the lesion until it is completely removed. The lesion is placed in a special fluid and sent to the lab for examination. If necessary, bleeding will be controlled with a device that delivers heat. The edges of the wound are stitched together and a dressing is applied. This procedure may be performed to treat a cancerous growth or noncancerous cyst or lesion. Surgeons commonly perform an elliptical excision, to minimize scarring.  AFTER THE PROCEDURE  How well you heal depends on many factors. Most patients heal quite well with proper techniques and self-care. Scarring will lessen over time. HOME CARE INSTRUCTIONS   Take medicines for pain as directed.  Keep the incision area clean, dry, and protected for at least 48 hours. Change dressings as directed.  For bleeding, apply gentle but firm pressure to the wound using a folded towel for 20 minutes. Call your caregiver if bleeding does not stop.  Avoid high-impact exercise and  activities until the stitches are removed or the area heals.  Follow your caregiver's instructions to minimize scarring. Avoid sun exposure until the area has healed. Scarring should lessen over time.  Follow up with your caregiver as directed. Removal of stitches within 4 to 14 days may be necessary. Finding out the results of your test Not all test results are available during your visit. If your test results are not back during the visit, make an appointment with your caregiver to find out the results. Do not assume everything is normal if you have not heard from your caregiver or the medical facility. It is important for you to follow up on all of your test results. SEEK MEDICAL CARE IF:   You or your child has an oral temperature above 102 F (38.9 C).  You develop signs of infection (chills, feeling unwell).  You notice bleeding, pain, discharge, redness, or swelling at the incision site.  You notice skin irregularities or changes in sensation. MAKE SURE YOU:   Understand these instructions.  Will watch your condition.  Will get help right away if you are not doing well or get worse. FOR MORE INFORMATION  American Academy of Family Physicians: www.AromatherapyParty.no American Academy of Dermatology: http://jones-macias.info/ Document Released: 02/16/2010 Document  Revised: 02/14/2012 Document Reviewed: 02/16/2010 ExitCare Patient Information 2015 [place], [place]. This information is not intended to replace advice given to you by your health care provider. Make sure you discuss any questions you have with your health care provider.

## 2014-10-04 NOTE — Anesthesia Postprocedure Evaluation (Signed)
  Anesthesia Post-op Note  Patient: James Tate  Anesthesia Post-op Note  Patient: James Tate  Procedure(s) Performed: Procedure(s) (LRB): WIDE LOCAL EXCISION OF UPPER BACK MELANOMA  (N/A)  Patient Location: PACU  Anesthesia Type: General  Level of Consciousness: awake and alert   Airway and Oxygen Therapy: Patient Spontanous Breathing  Post-op Pain: mild  Post-op Assessment: Post-op Vital signs reviewed, Patient's Cardiovascular Status Stable, Respiratory Function Stable, Patent Airway and No signs of Nausea or vomiting  Last Vitals:  Filed Vitals:   10/04/14 1011  BP: 114/72  Pulse: 57  Temp: 36.4 C  Resp: 16    Post-op Vital Signs: stable   Complications: No apparent anesthesia complications

## 2014-10-04 NOTE — H&P (Signed)
James Tate Patient #: [id number] DOB: 08-14-1946 Married / Language: Undefined / Race: Undefined Male  History of Present Illness James Tate M. James Corporan MD; 09/22/2014 3:24 PM) Patient words: possible mel. cancer.  The patient is a 68 year old male who presents with malignant melanoma. The patient is being seen for initial consultation for ( pT 1a ) superficial spreading melanoma. Tumor location is on the trunk (right upper back. Initial size was noted to be 0.5 x 1cm). Tumor is Clark level IV and Breslow thickness <= 1 mm (0.45 mm, no ulceration, mitoses <1, +deep and peripheral margins). No associated conditions are noted. The patient was referred by a primary care provider (Dr James Tate). Initial presentation was 3 week(s) ago for a growing mole. Current diagnosis was determined by shave biopsy (on 10/6). This problem has not been previously evaluated. This problem has not been previously treated. Symptoms do not include anorexia, nausea, weight loss, chest pain, bone pain or headache. Associated symptoms do not include seizures, jaundice or swollen glands. Pertinent medical history does not include previous melanoma or previous skin cancer. Risk factors include fair complexion, while risk factors do not include ionizing radiation exposure, toxic chemical exposure or immunosuppression. Pertinent family history does not include melanoma or dysplastic nevus syndrome.   Other Problems James Tate, CMA; 09/18/2014 10:51 AM) Enlarged Prostate Melanoma Other disease, cancer, significant illness  Past Surgical History James Tate, [name]; 09/18/2014 10:51 AM) Colon Polyp Removal - Colonoscopy Gallbladder Surgery - Laparoscopic Tonsillectomy  Diagnostic Studies History James Tate, [name]; 09/18/2014 10:51 AM) Colonoscopy within last year  Allergies James Tate, James Tate; 09/18/2014 10:58 AM) Erythromycin Derivatives Codeine/Codeine Derivatives  Medication History James Tate, [name]; 09/18/2014 10:58 AM) No Current Medications  Social History James Tate, [name]; 09/18/2014 10:51 AM) Alcohol use Occasional alcohol use. Caffeine use Carbonated beverages, Tea. No drug use Tobacco use Former smoker.  Family History James Tate, James Tate; 09/18/2014 10:51 AM) Cancer Mother. Depression Mother. Migraine Headache James Tate, Mother. Prostate Cancer Father. Thyroid problems James Tate.  Review of Systems James Tate CMA; 09/18/2014 10:51 AM) General Not Present- Appetite Loss, Chills, Fatigue, Fever, Night Sweats, Weight Gain and Weight Loss. Skin Present- Change in Wart/Mole. Not Present- Dryness, Hives, Jaundice, New Lesions, Non-Healing Wounds, Rash and Ulcer. HEENT Present- Hearing Loss, Oral Ulcers, Ringing in the Ears, Seasonal Allergies and Wears glasses/contact lenses. Not Present- Earache, Hoarseness, Nose Bleed, Sinus Pain, Sore Throat, Visual Disturbances and Yellow Eyes. Respiratory Present- Snoring. Not Present- Bloody sputum, Chronic Cough, Difficulty Breathing and Wheezing. Breast Not Present- Breast Mass, Breast Pain, Nipple Discharge and Skin Changes. Cardiovascular Not Present- Chest Pain, Difficulty Breathing Lying Down, Leg Cramps, Palpitations, Rapid Heart Rate, Shortness of Breath and Swelling of Extremities. Gastrointestinal Not Present- Abdominal Pain, Bloating, Bloody Stool, Change in Bowel Habits, Chronic diarrhea, Constipation, Difficulty Swallowing, Excessive gas, Gets full quickly at meals, Hemorrhoids, Indigestion, Nausea, Rectal Pain and Vomiting. Male Genitourinary Not Present- Blood in Urine, Change in Urinary Stream, Frequency, Impotence, Nocturia, Painful Urination, Urgency and Urine Leakage. Musculoskeletal Not Present- Back Pain, Joint Pain, Joint Stiffness, Muscle Pain, Muscle Weakness and Swelling of Extremities. Neurological Not Present- Decreased Memory, Fainting, Headaches, Numbness, Seizures, Tingling,  Tremor, Trouble walking and Weakness. Psychiatric Not Present- Anxiety, Bipolar, Change in Sleep Pattern, Depression, Fearful and Frequent crying. Endocrine Not Present- Cold Intolerance, Excessive Hunger, Hair Changes, Heat Intolerance, Hot flashes and New Diabetes. Hematology Not Present- Easy Bruising, Excessive bleeding, Gland problems, HIV and Persistent Infections.   Vitals James Tate CMA; 09/18/2014 11:00 AM)  09/18/2014 10:59 AM Height: 72in Temp.: 97.73F  Pulse: 62 (Regular)  BP: 120/60 (Sitting, Left Arm, Standard)    Physical Exam James Tate M. James Auguste MD; 09/22/2014 3:26 PM) General Mental Status-Alert. General Appearance-Consistent with stated age. Hydration-Well hydrated. Voice-Normal.  Head and Neck Head-normocephalic, atraumatic with no lesions or palpable masses. Trachea-midline. Thyroid Gland Characteristics - normal size and consistency.  Eye Eyeball - Bilateral-Extraocular movements intact. Sclera/Conjunctiva - Bilateral-No scleral icterus.  Chest and Lung Exam Chest and lung exam reveals -quiet, even and easy respiratory effort with no use of accessory muscles and on auscultation, normal breath sounds, no adventitious sounds and normal vocal resonance. Inspection Chest Wall - Normal. Back - Note: RIGHT UPPER BACK OVER MEDIAL SCAPULA - OPEN HEALING WOUND MEASURING 2 CM X 2CM WITH CENTRAL SHALLOW CRATER; NO SIGNIFICANT CELLULITIS. PICTURE IN EPIC.  Breast - Did not examine.  Cardiovascular Cardiovascular examination reveals -normal heart sounds, regular rate and rhythm with no murmurs and normal pedal pulses bilaterally.  Abdomen Inspection Inspection of the abdomen reveals - No Hernias. Skin - Scar - no surgical scars. Palpation/Percussion Palpation and Percussion of the abdomen reveal - Soft, Non Tender, No Rebound tenderness, No Rigidity (guarding) and No hepatosplenomegaly. Auscultation Auscultation of the abdomen  reveals - Bowel sounds normal.  Peripheral Vascular Upper Extremity Palpation - Pulses bilaterally normal.  Neurologic Neurologic evaluation reveals -alert and oriented x 3 with no impairment of recent or remote memory. Mental Status-Normal.  Neuropsychiatric The patient's mood and affect are described as -normal. Judgment and Insight-insight is appropriate concerning matters relevant to self.  Musculoskeletal Normal Exam - Left-Upper Extremity Strength Normal and Lower Extremity Strength Normal. Normal Exam - Right-Upper Extremity Strength Normal and Lower Extremity Strength Normal.  Lymphatic Head & Neck  General Head & Neck Lymphatics: Bilateral - Description - Normal. Axillary -Note: normal.  Femoral & Inguinal -Note: normal.     Assessment & Plan James Tate M. Rawley Harju MD; 09/22/2014 3:33 PM) MALIGNANT MELANOMA OF SKIN OF BACK (172.5  C43.59) Story: Right upper back T1aNx on shave bx Impression: We reviewed his pathology. We discussed melanoma and he was given educational material. According to NCCN guidelines based on the depth of his melanoma, he needs wide local excision (WLE) of his right upper back melanoma. His melanoma is not deep enough to warrant SLN bx. We discussed surgical prinicples of WLE that we would need to take a 1cm margin which would result in a decent sized upper back ellipitcal incision in order to close the wound. We discussed risks and benefits of surgery including but not limited bleeding, infection, wound issues, scaring, blood clot, general anesthesia, hematoma/seroma, need for additional procedures, perioperative cardiac/pulmonary issues. All of his questions were asked and answered. We are going to wait at least another week or so to allow his bx site to heal a little bit more before surgery.  We also discussed that he would need annual skin surveillance by a dermatologist.  He does not have any additional symptoms so I do not believe  he needs any imaging Current Plans  Schedule for Surgery Pt Education - Melanoma: cancer Leighton Ruff. Redmond Pulling, MD, FACS General, Bariatric, & Minimally Invasive Surgery [facility] Surgery, [place]

## 2014-10-04 NOTE — Transfer of Care (Signed)
Immediate Anesthesia Transfer of Care Note  Patient: James Tate  Procedure(s) Performed: Procedure(s): WIDE LOCAL EXCISION OF UPPER BACK MELANOMA  (N/A)  Patient Location: PACU  Anesthesia Type:General  Level of Consciousness: awake, alert  and oriented  Airway & Oxygen Therapy: Patient Spontanous Breathing and Patient connected to face mask oxygen  Post-op Assessment: Report given to PACU RN and Post -op Vital signs reviewed and stable  Post vital signs: Reviewed and stable  Complications: No apparent anesthesia complications

## 2014-10-07 ENCOUNTER — Encounter (HOSPITAL_COMMUNITY): Payer: Self-pay | Admitting: General Surgery

## 2014-10-28 ENCOUNTER — Other Ambulatory Visit (INDEPENDENT_AMBULATORY_CARE_PROVIDER_SITE_OTHER): Payer: Self-pay

## 2014-10-28 DIAGNOSIS — S21209A Unspecified open wound of unspecified back wall of thorax without penetration into thoracic cavity, initial encounter: Secondary | ICD-10-CM

## 2014-12-10 DIAGNOSIS — C4359 Malignant melanoma of other part of trunk: Secondary | ICD-10-CM | POA: Diagnosis not present

## 2014-12-10 DIAGNOSIS — S21209A Unspecified open wound of unspecified back wall of thorax without penetration into thoracic cavity, initial encounter: Secondary | ICD-10-CM | POA: Insufficient documentation

## 2014-12-13 ENCOUNTER — Encounter (HOSPITAL_BASED_OUTPATIENT_CLINIC_OR_DEPARTMENT_OTHER): Payer: Self-pay | Admitting: *Deleted

## 2014-12-13 ENCOUNTER — Other Ambulatory Visit: Payer: Self-pay | Admitting: Plastic Surgery

## 2014-12-13 DIAGNOSIS — L98422 Non-pressure chronic ulcer of back with fat layer exposed: Secondary | ICD-10-CM

## 2014-12-13 NOTE — H&P (Signed)
James Tate is an 69 y.o. male.   Chief Complaint: back ulcer HPI: The patient is a 69 yrs old wm here with his wife for evaluation of a wound on his back. He underwent excision and closure of a melanoma on the back. The original path showed: superficial spreading, maximum thickness 0.45 mm, anatomic level IV. There was focal ulceration with absent margins, the peripheral margins and deep margins were involved by in-situ melanoma at the hair follicle. The mitotic index: <1/mm2. There was no lymph-vascular invasion or tumor regression. The path stage was PT1A NX MX. The last excision showed no residual tumor. The area was closed at the time of the surgery but got infected and had to be opened and drained. He now has a ~ 7 x 6 cm wound on the back. It is clean and granulating well but has been open for 2 months.  Past Medical History  Diagnosis Date  . Osteopenia   . Melanoma in situ of back   . BPH (benign prostatic hyperplasia)   . Flat feet     Past Surgical History  Procedure Laterality Date  . Tonsillectomy    . Cholecystectomy  1996  . Lasik  2003  . Parathyroidectomy  2004  . Melanoma excision N/A 10/04/2014    Procedure: WIDE LOCAL EXCISION OF UPPER BACK MELANOMA ;  Surgeon: Gayland Curry, MD;  Location: [facility];  Service: General;  Laterality: N/A;    No family history on file. Social History:  reports that he quit smoking about 43 years ago. He does not have any smokeless tobacco history on file. He reports that he drinks alcohol. He reports that he does not use illicit drugs.  Allergies:  Allergies  Allergen Reactions  . Codeine Other (See Comments)    Headache.   . Erythromycin     Childhood allergy.      (Not in a hospital admission)  No results found for this or any previous visit (from the past 48 hour(s)). No results found.  Review of Systems  Constitutional: Negative.   HENT: Negative.   Eyes: Negative.   Respiratory: Negative.   Cardiovascular:  Negative.   Gastrointestinal: Negative.   Genitourinary: Negative.   Musculoskeletal: Negative.   Skin: Negative.   Neurological: Negative.   Psychiatric/Behavioral: Negative.     There were no vitals taken for this visit. Physical Exam  Constitutional: He is oriented to person, place, and time. He appears well-developed and well-nourished.  HENT:  Head: Normocephalic and atraumatic.  Eyes: Conjunctivae and EOM are normal. Pupils are equal, round, and reactive to light.  Cardiovascular: Normal rate.   Respiratory: Effort normal.  Musculoskeletal: Normal range of motion.  Neurological: He is alert and oriented to person, place, and time.  Skin: Skin is warm.  Psychiatric: He has a normal mood and affect. His behavior is normal. Judgment and thought content normal.     Assessment/Plan Plan for closure of back wound with Acell placement  [facility] 12/13/2014, 12:54 PM

## 2014-12-13 NOTE — Progress Notes (Signed)
No labs needed

## 2014-12-19 ENCOUNTER — Ambulatory Visit (HOSPITAL_BASED_OUTPATIENT_CLINIC_OR_DEPARTMENT_OTHER)
Admission: RE | Admit: 2014-12-19 | Discharge: 2014-12-19 | Disposition: A | Discharge status: Home / Self Care | Payer: Medicare Other | Source: Ambulatory Visit | Attending: Plastic Surgery | Admitting: Plastic Surgery

## 2014-12-19 ENCOUNTER — Ambulatory Visit (HOSPITAL_BASED_OUTPATIENT_CLINIC_OR_DEPARTMENT_OTHER): Payer: Medicare Other | Admitting: Anesthesiology

## 2014-12-19 ENCOUNTER — Encounter (HOSPITAL_BASED_OUTPATIENT_CLINIC_OR_DEPARTMENT_OTHER)
Admission: RE | Disposition: A | Discharge status: Home / Self Care | Payer: Self-pay | Source: Ambulatory Visit | Attending: Plastic Surgery

## 2014-12-19 ENCOUNTER — Encounter (HOSPITAL_BASED_OUTPATIENT_CLINIC_OR_DEPARTMENT_OTHER): Payer: Self-pay | Admitting: Plastic Surgery

## 2014-12-19 DIAGNOSIS — L98429 Non-pressure chronic ulcer of back with unspecified severity: Secondary | ICD-10-CM | POA: Insufficient documentation

## 2014-12-19 DIAGNOSIS — Y838 Other surgical procedures as the cause of abnormal reaction of the patient, or of later complication, without mention of misadventure at the time of the procedure: Secondary | ICD-10-CM | POA: Diagnosis not present

## 2014-12-19 DIAGNOSIS — L98422 Non-pressure chronic ulcer of back with fat layer exposed: Secondary | ICD-10-CM

## 2014-12-19 DIAGNOSIS — Z87891 Personal history of nicotine dependence: Secondary | ICD-10-CM | POA: Diagnosis not present

## 2014-12-19 DIAGNOSIS — T8189XA Other complications of procedures, not elsewhere classified, initial encounter: Secondary | ICD-10-CM | POA: Insufficient documentation

## 2014-12-19 DIAGNOSIS — N400 Enlarged prostate without lower urinary tract symptoms: Secondary | ICD-10-CM | POA: Diagnosis not present

## 2014-12-19 DIAGNOSIS — C4359 Malignant melanoma of other part of trunk: Secondary | ICD-10-CM | POA: Diagnosis not present

## 2014-12-19 DIAGNOSIS — S21209A Unspecified open wound of unspecified back wall of thorax without penetration into thoracic cavity, initial encounter: Secondary | ICD-10-CM | POA: Diagnosis not present

## 2014-12-19 HISTORY — PX: APPLICATION OF A-CELL OF BACK: SHX6301

## 2014-12-19 HISTORY — DX: Presence of external hearing-aid: Z97.4

## 2014-12-19 LAB — POCT HEMOGLOBIN-HEMACUE: Hemoglobin: 16.4 g/dL (ref 13.0–17.0)

## 2014-12-19 SURGERY — APPLICATION OF A-CELL OF BACK
Anesthesia: General | Site: Back

## 2014-12-19 MED ORDER — MIDAZOLAM HCL 5 MG/5ML IJ SOLN
INTRAMUSCULAR | Status: DC | PRN
  Administered 2014-12-19: 2 mg via INTRAVENOUS

## 2014-12-19 MED ORDER — OXYCODONE HCL 5 MG/5ML PO SOLN: 5.0000 mg | Freq: Once | ORAL | Status: DC | PRN

## 2014-12-19 MED ORDER — CEFAZOLIN SODIUM-DEXTROSE 2-3 GM-% IV SOLR
INTRAVENOUS | Status: AC
  Filled 2014-12-19: qty 50

## 2014-12-19 MED ORDER — ONDANSETRON HCL 4 MG/2ML IJ SOLN: 4.0000 mg | Freq: Once | INTRAMUSCULAR | Status: DC | PRN

## 2014-12-19 MED ORDER — FENTANYL CITRATE 0.05 MG/ML IJ SOLN: 50.0000 ug | INTRAMUSCULAR | Status: DC | PRN

## 2014-12-19 MED ORDER — SODIUM CHLORIDE 0.9 % IR SOLN
Status: DC | PRN
  Administered 2014-12-19: 100 mL

## 2014-12-19 MED ORDER — MIDAZOLAM HCL 2 MG/2ML IJ SOLN: 1.0000 mg | INTRAMUSCULAR | Status: DC | PRN

## 2014-12-19 MED ORDER — HYDROMORPHONE HCL 1 MG/ML IJ SOLN: 0.2500 mg | INTRAMUSCULAR | Status: DC | PRN

## 2014-12-19 MED ORDER — SUCCINYLCHOLINE CHLORIDE 20 MG/ML IJ SOLN
INTRAMUSCULAR | Status: DC | PRN
  Administered 2014-12-19: 100 mg via INTRAVENOUS

## 2014-12-19 MED ORDER — DEXAMETHASONE SODIUM PHOSPHATE 4 MG/ML IJ SOLN
INTRAMUSCULAR | Status: DC | PRN
  Administered 2014-12-19: 10 mg via INTRAVENOUS

## 2014-12-19 MED ORDER — ONDANSETRON HCL 4 MG/2ML IJ SOLN
INTRAMUSCULAR | Status: DC | PRN
  Administered 2014-12-19: 4 mg via INTRAVENOUS

## 2014-12-19 MED ORDER — FENTANYL CITRATE 0.05 MG/ML IJ SOLN
INTRAMUSCULAR | Status: AC
  Filled 2014-12-19: qty 6

## 2014-12-19 MED ORDER — CEFAZOLIN SODIUM-DEXTROSE 2-3 GM-% IV SOLR
2.0000 g | INTRAVENOUS | Status: AC
  Administered 2014-12-19: 2 g via INTRAVENOUS

## 2014-12-19 MED ORDER — LIDOCAINE HCL (CARDIAC) 20 MG/ML IV SOLN
INTRAVENOUS | Status: DC | PRN
  Administered 2014-12-19: 50 mg via INTRAVENOUS

## 2014-12-19 MED ORDER — LACTATED RINGERS IV SOLN
INTRAVENOUS | Status: DC
  Administered 2014-12-19 (×2): via INTRAVENOUS

## 2014-12-19 MED ORDER — FENTANYL CITRATE 0.05 MG/ML IJ SOLN
INTRAMUSCULAR | Status: DC | PRN
  Administered 2014-12-19: 100 ug via INTRAVENOUS

## 2014-12-19 MED ORDER — OXYCODONE HCL 5 MG PO TABS: 5.0000 mg | ORAL_TABLET | Freq: Once | ORAL | Status: DC | PRN

## 2014-12-19 MED ORDER — PROPOFOL 10 MG/ML IV BOLUS
INTRAVENOUS | Status: DC | PRN
  Administered 2014-12-19: 200 mg via INTRAVENOUS

## 2014-12-19 MED ORDER — MIDAZOLAM HCL 2 MG/2ML IJ SOLN
INTRAMUSCULAR | Status: AC
  Filled 2014-12-19: qty 2

## 2014-12-19 SURGICAL SUPPLY — 62 items
BAG DECANTER FOR FLEXI CONT (MISCELLANEOUS) IMPLANT
BLADE CLIPPER SURG (BLADE) IMPLANT
BLADE HEX COATED 2.75 (ELECTRODE) ×4 IMPLANT
BLADE SURG 10 STRL SS (BLADE) IMPLANT
BLADE SURG 15 STRL LF DISP TIS (BLADE) ×2 IMPLANT
BLADE SURG 15 STRL SS (BLADE) ×2
BNDG GAUZE ELAST 4 BULKY (GAUZE/BANDAGES/DRESSINGS) IMPLANT
CANISTER SUCT 1200ML W/VALVE (MISCELLANEOUS) IMPLANT
COVER BACK TABLE 60X90IN (DRAPES) ×4 IMPLANT
COVER MAYO STAND STRL (DRAPES) ×4 IMPLANT
DECANTER SPIKE VIAL GLASS SM (MISCELLANEOUS) IMPLANT
DRAIN CHANNEL 19F RND (DRAIN) IMPLANT
DRAPE INCISE IOBAN 66X45 STRL (DRAPES) IMPLANT
DRAPE LAPAROSCOPIC ABDOMINAL (DRAPES) IMPLANT
DRAPE PED LAPAROTOMY (DRAPES) ×4 IMPLANT
DRAPE SURG 17X23 STRL (DRAPES) IMPLANT
DRAPE U-SHAPE 76X120 STRL (DRAPES) IMPLANT
DRSG ADAPTIC 3X8 NADH LF (GAUZE/BANDAGES/DRESSINGS) IMPLANT
DRSG EMULSION OIL 3X3 NADH (GAUZE/BANDAGES/DRESSINGS) IMPLANT
DRSG PAD ABDOMINAL 8X10 ST (GAUZE/BANDAGES/DRESSINGS) ×4 IMPLANT
DRSG TEGADERM 4X10 (GAUZE/BANDAGES/DRESSINGS) IMPLANT
DRSG TELFA 3X8 NADH (GAUZE/BANDAGES/DRESSINGS) IMPLANT
ELECT REM PT RETURN 9FT ADLT (ELECTROSURGICAL) ×4
ELECTRODE REM PT RTRN 9FT ADLT (ELECTROSURGICAL) ×2 IMPLANT
EVACUATOR SILICONE 100CC (DRAIN) IMPLANT
GAUZE SPONGE 4X4 12PLY STRL (GAUZE/BANDAGES/DRESSINGS) ×4 IMPLANT
GAUZE XEROFORM 5X9 LF (GAUZE/BANDAGES/DRESSINGS) IMPLANT
GLOVE BIO SURGEON STRL SZ 6.5 (GLOVE) ×9 IMPLANT
GLOVE BIO SURGEONS STRL SZ 6.5 (GLOVE) ×3
GLOVE SURG SS PI 7.0 STRL IVOR (GLOVE) ×4 IMPLANT
GOWN STRL REUS W/ TWL LRG LVL3 (GOWN DISPOSABLE) ×8 IMPLANT
GOWN STRL REUS W/TWL LRG LVL3 (GOWN DISPOSABLE) ×8
MATRIX SURGICAL PSM 7X10CM (Tissue) ×4 IMPLANT
MICROMATRIX 500MG (Tissue) ×4 IMPLANT
NEEDLE HYPO 25X1 1.5 SAFETY (NEEDLE) ×4 IMPLANT
NS IRRIG 1000ML POUR BTL (IV SOLUTION) ×4 IMPLANT
PACK BASIN DAY SURGERY FS (CUSTOM PROCEDURE TRAY) ×4 IMPLANT
PENCIL BUTTON HOLSTER BLD 10FT (ELECTRODE) ×4 IMPLANT
SHEET MEDIUM DRAPE 40X70 STRL (DRAPES) IMPLANT
SLEEVE SCD COMPRESS KNEE MED (MISCELLANEOUS) ×4 IMPLANT
SOLUTION PARTIC MCRMTRX 500MG (Tissue) ×2 IMPLANT
SPONGE LAP 18X18 X RAY DECT (DISPOSABLE) ×4 IMPLANT
STAPLER VISISTAT 35W (STAPLE) IMPLANT
SUCTION FRAZIER TIP 10 FR DISP (SUCTIONS) IMPLANT
SURGILUBE 2OZ TUBE FLIPTOP (MISCELLANEOUS) ×4 IMPLANT
SUT MNCRL AB 3-0 PS2 18 (SUTURE) IMPLANT
SUT MNCRL AB 4-0 PS2 18 (SUTURE) IMPLANT
SUT SILK 3 0 PS 1 (SUTURE) IMPLANT
SUT SILK 4 0 PS 2 (SUTURE) ×4 IMPLANT
SUT VIC AB 3-0 FS2 27 (SUTURE) IMPLANT
SUT VIC AB 5-0 PS2 18 (SUTURE) IMPLANT
SUT VICRYL 4-0 PS2 18IN ABS (SUTURE) ×4 IMPLANT
SWAB COLLECTION DEVICE MRSA (MISCELLANEOUS) IMPLANT
SYR BULB IRRIGATION 50ML (SYRINGE) IMPLANT
SYR CONTROL 10ML LL (SYRINGE) ×4 IMPLANT
TOWEL OR 17X24 6PK STRL BLUE (TOWEL DISPOSABLE) ×8 IMPLANT
TRAY DSU PREP LF (CUSTOM PROCEDURE TRAY) ×4 IMPLANT
TUBE ANAEROBIC SPECIMEN COL (MISCELLANEOUS) IMPLANT
TUBE CONNECTING 20'X1/4 (TUBING) ×1
TUBE CONNECTING 20X1/4 (TUBING) ×3 IMPLANT
UNDERPAD 30X30 INCONTINENT (UNDERPADS AND DIAPERS) IMPLANT
YANKAUER SUCT BULB TIP NO VENT (SUCTIONS) ×4 IMPLANT

## 2014-12-19 NOTE — Anesthesia Procedure Notes (Signed)
Procedure Name: Intubation Date/Time: 12/19/2014 10:36 AM Performed by: Melynda Ripple D Pre-anesthesia Checklist: Patient identified, Emergency Drugs available, Suction available and Patient being monitored Patient Re-evaluated:Patient Re-evaluated prior to inductionOxygen Delivery Method: Circle System Utilized Preoxygenation: Pre-oxygenation with 100% oxygen Intubation Type: IV induction Ventilation: Mask ventilation without difficulty Laryngoscope Size: Mac and 3 Grade View: Grade I Tube type: Oral Tube size: 7.0 mm Number of attempts: 1 Airway Equipment and Method: Stylet and Oral airway Placement Confirmation: ETT inserted through vocal cords under direct vision,  positive ETCO2 and breath sounds checked- equal and bilateral Secured at: 22 cm Tube secured with: Tape Dental Injury: Teeth and Oropharynx as per pre-operative assessment

## 2014-12-19 NOTE — Brief Op Note (Signed)
12/19/2014  11:00 AM  PATIENT:  James Tate  69 y.o. male  PRE-OPERATIVE DIAGNOSIS:  MALIGMANT MELANOMA OF BACK AND OPEN WOUND OF BACK  POST-OPERATIVE DIAGNOSIS:  MALIGMANT MELANOMA OF BACK AND OPEN WOUND OF BACK  PROCEDURE:  Procedure(s): APPLICATION OF A-CELL OF BACK (N/A)  SURGEON:  Surgeon(s) and Role:    * Wilian Kwong Sanger, DO - Primary  PHYSICIAN ASSISTANT: Shawn Rayburn, PA  ASSISTANTS: none   ANESTHESIA:   general  EBL:  Total I/O In: 1000 [I.V.:1000] Out: -   BLOOD ADMINISTERED:none  DRAINS: none   LOCAL MEDICATIONS USED:  NONE  SPECIMEN:  No Specimen  DISPOSITION OF SPECIMEN:  N/A  COUNTS:  YES  TOURNIQUET:  * No tourniquets in log *  DICTATION: .Dragon Dictation  PLAN OF CARE: Discharge to home after PACU  PATIENT DISPOSITION:  PACU - hemodynamically stable.   Delay start of Pharmacological VTE agent (>24hrs) due to surgical blood loss or risk of bleeding: no

## 2014-12-19 NOTE — Interval H&P Note (Signed)
History and Physical Interval Note:  12/19/2014 7:02 AM  James Tate  has presented today for surgery, with the diagnosis of [place] WOUND OF BACK  The various methods of treatment have been discussed with the patient and family. After consideration of risks, benefits and other options for treatment, the patient has consented to  Procedure(s): CLOSURE OF BACK WOUND WITH PLACEMENT OF A-CELL (N/A) APPLICATION OF A-CELL OF BACK (N/A) as a surgical intervention .  The patient's history has been reviewed, patient examined, no change in status, stable for surgery.  I have reviewed the patient's chart and labs.  Questions were answered to the patient's satisfaction.     SANGER,CLAIRE

## 2014-12-19 NOTE — Discharge Instructions (Signed)
Apply KY gel to the wound daily.   Post Anesthesia Home Care Instructions  Activity: Get plenty of rest for the remainder of the day. A responsible adult should stay with you for 24 hours following the procedure.  For the next 24 hours, DO NOT: -Drive a car -Paediatric nurse -Drink alcoholic beverages -Take any medication unless instructed by your physician -Make any legal decisions or sign important papers.  Meals: Start with liquid foods such as gelatin or soup. Progress to regular foods as tolerated. Avoid greasy, spicy, heavy foods. If nausea and/or vomiting occur, drink only clear liquids until the nausea and/or vomiting subsides. Call your physician if vomiting continues.  Special Instructions/Symptoms: Your throat may feel dry or sore from the anesthesia or the breathing tube placed in your throat during surgery. If this causes discomfort, gargle with warm salt water. The discomfort should disappear within 24 hours.   Call your surgeon if you experience:   1.  Fever over 101.0. 2.  Inability to urinate. 3.  Nausea and/or vomiting. 4.  Extreme swelling or bruising at the surgical site. 5.  Continued bleeding from the incision. 6.  Increased pain, redness or drainage from the incision. 7.  Problems related to your pain medication. 8. Any change in color, movement and/or sensation 9. Any problems and/or concerns

## 2014-12-19 NOTE — Anesthesia Postprocedure Evaluation (Signed)
  Anesthesia Post-op Note  Patient: James Tate  Procedure(s) Performed: Procedure(s): APPLICATION OF A-CELL OF BACK (N/A)  Patient Location: PACU  Anesthesia Type: General   Level of Consciousness: awake, alert  and oriented  Airway and Oxygen Therapy: Patient Spontanous Breathing  Post-op Pain: mild  Post-op Assessment: Post-op Vital signs reviewed  Post-op Vital Signs: Reviewed  Last Vitals:  Filed Vitals:   12/19/14 1202  BP: 120/75  Pulse: 58  Temp: 36.4 C  Resp: 16    Complications: No apparent anesthesia complications

## 2014-12-19 NOTE — Anesthesia Preprocedure Evaluation (Signed)
Anesthesia Evaluation  Patient identified by MRN, date of birth, ID band Patient awake    Reviewed: Allergy & Precautions, NPO status , Patient's Chart, lab work & pertinent test results  Airway Mallampati: I  TM Distance: >3 FB Neck ROM: Full    Dental  (+) Teeth Intact, Dental Advisory Given   Pulmonary former smoker,  breath sounds clear to auscultation        Cardiovascular Rhythm:Regular Rate:Normal     Neuro/Psych    GI/Hepatic   Endo/Other    Renal/GU      Musculoskeletal   Abdominal   Peds  Hematology   Anesthesia Other Findings   Reproductive/Obstetrics                             Anesthesia Physical Anesthesia Plan  ASA: II  Anesthesia Plan: General   Post-op Pain Management:    Induction: Intravenous  Airway Management Planned: LMA  Additional Equipment:   Intra-op Plan:   Post-operative Plan: Extubation in OR  Informed Consent: I have reviewed the patients History and Physical, chart, labs and discussed the procedure including the risks, benefits and alternatives for the proposed anesthesia with the patient or authorized representative who has indicated his/her understanding and acceptance.   Dental advisory given  Plan Discussed with: CRNA, Anesthesiologist and Surgeon  Anesthesia Plan Comments:         Anesthesia Quick Evaluation

## 2014-12-19 NOTE — H&P (View-Only) (Signed)
James Tate is an 69 y.o. male.   Chief Complaint: back ulcer HPI: The patient is a 69 yrs old wm here with his wife for evaluation of a wound on his back. He underwent excision and closure of a melanoma on the back. The original path showed: superficial spreading, maximum thickness 0.45 mm, anatomic level IV. There was focal ulceration with absent margins, the peripheral margins and deep margins were involved by in-situ melanoma at the hair follicle. The mitotic index: <1/mm2. There was no lymph-vascular invasion or tumor regression. The path stage was PT1A NX MX. The last excision showed no residual tumor. The area was closed at the time of the surgery but got infected and had to be opened and drained. He now has a ~ 7 x 6 cm wound on the back. It is clean and granulating well but has been open for 2 months.  Past Medical History  Diagnosis Date  . Osteopenia   . Melanoma in situ of back   . BPH (benign prostatic hyperplasia)   . Flat feet     Past Surgical History  Procedure Laterality Date  . Tonsillectomy    . Cholecystectomy  1996  . Lasik  2003  . Parathyroidectomy  2004  . Melanoma excision N/A 10/04/2014    Procedure: WIDE LOCAL EXCISION OF UPPER BACK MELANOMA ;  Surgeon: Gayland Curry, MD;  Location: [facility];  Service: General;  Laterality: N/A;    No family history on file. Social History:  reports that he quit smoking about 43 years ago. He does not have any smokeless tobacco history on file. He reports that he drinks alcohol. He reports that he does not use illicit drugs.  Allergies:  Allergies  Allergen Reactions  . Codeine Other (See Comments)    Headache.   . Erythromycin     Childhood allergy.      (Not in a hospital admission)  No results found for this or any previous visit (from the past 48 hour(s)). No results found.  Review of Systems  Constitutional: Negative.   HENT: Negative.   Eyes: Negative.   Respiratory: Negative.   Cardiovascular:  Negative.   Gastrointestinal: Negative.   Genitourinary: Negative.   Musculoskeletal: Negative.   Skin: Negative.   Neurological: Negative.   Psychiatric/Behavioral: Negative.     There were no vitals taken for this visit. Physical Exam  Constitutional: He is oriented to person, place, and time. He appears well-developed and well-nourished.  HENT:  Head: Normocephalic and atraumatic.  Eyes: Conjunctivae and EOM are normal. Pupils are equal, round, and reactive to light.  Cardiovascular: Normal rate.   Respiratory: Effort normal.  Musculoskeletal: Normal range of motion.  Neurological: He is alert and oriented to person, place, and time.  Skin: Skin is warm.  Psychiatric: He has a normal mood and affect. His behavior is normal. Judgment and thought content normal.     Assessment/Plan Plan for closure of back wound with Acell placement  [facility] 12/13/2014, 12:54 PM

## 2014-12-19 NOTE — Op Note (Addendum)
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   COMPLICATIONS: None.   INDICATIONS FOR PROCEDURE:  The patient, James Tate is a 69 y.o. male born on July 25, 1946, is here for treatment of a back ulcer. MRN: [id number]  CONSENT:  Informed consent was obtained directly from the patient. Risks, benefits and alternatives were fully discussed. Specific risks including but not limited to bleeding, infection, hematoma, seroma, scarring, pain, infection, contracture, asymmetry, wound healing problems, and need for further surgery were all discussed. The patient did have an ample opportunity to have questions answered to satisfaction.   DESCRIPTION OF PROCEDURE:  The patient was taken to the operating room. SCDs were placed and IV antibiotics were given. The patient's operative site was prepped and draped in a sterile fashion. A time out was performed and all information was confirmed to be correct.  General anesthesia was administered.  The area 6 x 3 cm was cleaned with normal saline.  The Acell powder 500 mg and sheet 7 x 10 cm were applied and secured with 5-0 Vicryl.  Adaptic was applied with KY gel and gauze. The patient tolerated the procedure well.  There were no complications. The patient was allowed to wake from anesthesia, extubated and taken to the recovery room in satisfactory condition.

## 2014-12-19 NOTE — Transfer of Care (Signed)
Immediate Anesthesia Transfer of Care Note  Patient: James Tate  Procedure(s) Performed: Procedure(s): APPLICATION OF A-CELL OF BACK (N/A)  Patient Location: PACU  Anesthesia Type:General  Level of Consciousness: awake, alert  and oriented  Airway & Oxygen Therapy: Patient Spontanous Breathing and Patient connected to face mask oxygen  Post-op Assessment: Report given to PACU RN and Post -op Vital signs reviewed and stable  Post vital signs: Reviewed and stable  Complications: No apparent anesthesia complications

## 2014-12-20 ENCOUNTER — Encounter (HOSPITAL_BASED_OUTPATIENT_CLINIC_OR_DEPARTMENT_OTHER): Payer: Self-pay | Admitting: Plastic Surgery

## 2014-12-31 DIAGNOSIS — S21209D Unspecified open wound of unspecified back wall of thorax without penetration into thoracic cavity, subsequent encounter: Secondary | ICD-10-CM | POA: Diagnosis not present

## 2014-12-31 DIAGNOSIS — Z85820 Personal history of malignant melanoma of skin: Secondary | ICD-10-CM | POA: Diagnosis not present

## 2015-01-07 DIAGNOSIS — S21209D Unspecified open wound of unspecified back wall of thorax without penetration into thoracic cavity, subsequent encounter: Secondary | ICD-10-CM | POA: Diagnosis not present

## 2015-01-07 DIAGNOSIS — Z85820 Personal history of malignant melanoma of skin: Secondary | ICD-10-CM | POA: Diagnosis not present

## 2015-01-14 DIAGNOSIS — Z85820 Personal history of malignant melanoma of skin: Secondary | ICD-10-CM | POA: Diagnosis not present

## 2015-01-14 DIAGNOSIS — S21209D Unspecified open wound of unspecified back wall of thorax without penetration into thoracic cavity, subsequent encounter: Secondary | ICD-10-CM | POA: Diagnosis not present

## 2015-01-28 DIAGNOSIS — Z85820 Personal history of malignant melanoma of skin: Secondary | ICD-10-CM | POA: Diagnosis not present

## 2015-01-28 DIAGNOSIS — S21209D Unspecified open wound of unspecified back wall of thorax without penetration into thoracic cavity, subsequent encounter: Secondary | ICD-10-CM | POA: Diagnosis not present

## 2015-01-30 DIAGNOSIS — M1712 Unilateral primary osteoarthritis, left knee: Secondary | ICD-10-CM | POA: Diagnosis not present

## 2015-03-04 DIAGNOSIS — L905 Scar conditions and fibrosis of skin: Secondary | ICD-10-CM | POA: Diagnosis not present

## 2015-03-04 DIAGNOSIS — Z85820 Personal history of malignant melanoma of skin: Secondary | ICD-10-CM | POA: Diagnosis not present

## 2015-03-07 DIAGNOSIS — I639 Cerebral infarction, unspecified: Secondary | ICD-10-CM

## 2015-03-07 HISTORY — DX: Cerebral infarction, unspecified: I63.9

## 2015-03-18 ENCOUNTER — Other Ambulatory Visit: Payer: Self-pay | Admitting: Plastic Surgery

## 2015-03-18 DIAGNOSIS — L905 Scar conditions and fibrosis of skin: Secondary | ICD-10-CM

## 2015-03-18 NOTE — H&P (Signed)
James Tate is an 69 y.o. male.   Chief Complaint: scar of back after melanoma excision HPI: The patient is a 69 yrs old wm here with his wife for follow up on his back wound after excision of melanoma. The original path showed: superficial spreading, maximum thickness 0.45 mm, anatomic level IV. There was focal ulceration with absent margins, the peripheral margins and deep margins were involved by in-situ melanoma at the hair follicle. The mitotic index: <1/mm2. There was no lymph-vascular invasion or tumor regression. The path stage was PT1A NX MX. The last excision showed no residual tumor. The area was closed at the time of the surgery but got infected and had to be opened and drained. He now then had a ~ 7 x 6 cm wound on the back. It has since healed but the skin is very thin and there is concern that it will break down.  Past Medical History  Diagnosis Date  . Osteopenia   . Melanoma in situ of back   . BPH (benign prostatic hyperplasia)   . Flat feet   . Wears hearing aid     both    Past Surgical History  Procedure Laterality Date  . Tonsillectomy    . Cholecystectomy  1996  . Lasik  2003  . Parathyroidectomy  2004  . Melanoma excision N/A 10/04/2014    Procedure: WIDE LOCAL EXCISION OF UPPER BACK MELANOMA ;  Surgeon: Gayland Curry, MD;  Location: [facility];  Service: General;  Laterality: N/A;  . Application of a-cell of back N/A 12/19/2014    Procedure: APPLICATION OF A-CELL OF BACK;  Surgeon: Theodoro Kos, DO;  Location: [place];  Service: Plastics;  Laterality: N/A;    No family history on file. Social History:  reports that he quit smoking about 43 years ago. He does not have any smokeless tobacco history on file. He reports that he drinks alcohol. He reports that he does not use illicit drugs.  Allergies:  Allergies  Allergen Reactions  . Codeine Other (See Comments)    Headache.   . Erythromycin     Childhood allergy.      (Not in a hospital  admission)  No results found for this or any previous visit (from the past 48 hour(s)). No results found.  Review of Systems  Constitutional: Negative.   HENT: Negative.   Eyes: Negative.   Respiratory: Negative.   Cardiovascular: Negative.   Gastrointestinal: Negative.   Genitourinary: Negative.   Musculoskeletal: Negative.   Skin: Negative.   Psychiatric/Behavioral: Negative.     There were no vitals taken for this visit. Physical Exam  Constitutional: He is oriented to person, place, and time. He appears well-developed and well-nourished.  HENT:  Head: Normocephalic and atraumatic.  Eyes: Conjunctivae and EOM are normal. Pupils are equal, round, and reactive to light.  Respiratory: Effort normal.    GI: Soft.  Neurological: He is alert and oriented to person, place, and time.  Skin: Skin is warm.  Psychiatric: He has a normal mood and affect. His behavior is normal. Judgment and thought content normal.     Assessment/Plan Recommend serial excision of the thin scar and primary closure for a longer lasting repair.      [place] 03/18/2015, 1:01 PM

## 2015-03-21 DIAGNOSIS — R29810 Facial weakness: Secondary | ICD-10-CM | POA: Diagnosis not present

## 2015-03-21 DIAGNOSIS — I639 Cerebral infarction, unspecified: Secondary | ICD-10-CM | POA: Diagnosis not present

## 2015-03-21 DIAGNOSIS — Z85820 Personal history of malignant melanoma of skin: Secondary | ICD-10-CM | POA: Diagnosis not present

## 2015-03-21 DIAGNOSIS — R471 Dysarthria and anarthria: Secondary | ICD-10-CM | POA: Diagnosis not present

## 2015-03-21 DIAGNOSIS — R419 Unspecified symptoms and signs involving cognitive functions and awareness: Secondary | ICD-10-CM | POA: Diagnosis not present

## 2015-04-02 DIAGNOSIS — I639 Cerebral infarction, unspecified: Secondary | ICD-10-CM | POA: Diagnosis not present

## 2015-04-03 ENCOUNTER — Ambulatory Visit (HOSPITAL_BASED_OUTPATIENT_CLINIC_OR_DEPARTMENT_OTHER): Admission: RE | Admit: 2015-04-03 | Payer: Medicare Other | Source: Ambulatory Visit | Admitting: Plastic Surgery

## 2015-04-03 ENCOUNTER — Encounter (HOSPITAL_BASED_OUTPATIENT_CLINIC_OR_DEPARTMENT_OTHER): Admission: RE | Payer: Self-pay | Source: Ambulatory Visit

## 2015-04-03 SURGERY — WIDE EXCISION, LESION, UPPER EXTREMITY
Anesthesia: General

## 2015-04-04 ENCOUNTER — Ambulatory Visit (HOSPITAL_COMMUNITY): Payer: Medicare Other | Attending: Cardiovascular Disease | Admitting: Radiology

## 2015-04-04 ENCOUNTER — Other Ambulatory Visit (HOSPITAL_COMMUNITY): Payer: Self-pay | Admitting: Family Medicine

## 2015-04-04 DIAGNOSIS — I639 Cerebral infarction, unspecified: Secondary | ICD-10-CM | POA: Insufficient documentation

## 2015-04-04 NOTE — Progress Notes (Signed)
Echocardiogram performed.  

## 2015-04-22 DIAGNOSIS — R972 Elevated prostate specific antigen [PSA]: Secondary | ICD-10-CM | POA: Diagnosis not present

## 2015-04-29 DIAGNOSIS — R972 Elevated prostate specific antigen [PSA]: Secondary | ICD-10-CM | POA: Diagnosis not present

## 2015-05-22 ENCOUNTER — Telehealth: Payer: Self-pay | Admitting: *Deleted

## 2015-05-22 NOTE — Telephone Encounter (Signed)
called to update fm hx & status...unable to reach pt.Marland KitchenMarland KitchenMarland Kitchen

## 2015-05-23 ENCOUNTER — Telehealth: Payer: Self-pay | Admitting: *Deleted

## 2015-05-23 ENCOUNTER — Encounter: Payer: Self-pay | Admitting: *Deleted

## 2015-05-23 NOTE — Telephone Encounter (Signed)
pt called back to update fm hx & status.Marland KitchenMarland Kitchen

## 2015-05-23 NOTE — Progress Notes (Signed)
Patient ID: James Tate, male   DOB: 02-12-1946, 69 y.o.   MRN: [id number]     Cardiology Office Note   Date:  05/26/2015   ID:  James Tate, DOB [birth year], MRN [id number]  PCP:  Simona Huh, MD  Cardiologist:   Jenkins Rouge, MD   No chief complaint on file.     History of Present Illness: James Tate is a 69 y.o. male who presents for evaluation of abnormal echo Reviewed from  04/04/15 read by myself Indication of echo suggested CVA  Study Conclusions  - Left ventricle: The cavity size was normal. Wall thickness was increased in a pattern of mild LVH. Systolic function was normal. The estimated ejection fraction was in the range of 55% to 60%. - Mitral valve: There was mild regurgitation. - Left atrium: The atrium was mildly dilated. 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    Past Medical History  Diagnosis Date  . Osteopenia   . Melanoma in situ of back   . BPH (benign prostatic hyperplasia)   . Flat feet   . Wears hearing aid     both    Past Surgical History  Procedure Laterality Date  . Tonsillectomy    . Cholecystectomy  1996  . Lasik  2003  . Parathyroidectomy  2004  . Melanoma excision N/A 10/04/2014    Procedure: WIDE LOCAL EXCISION OF UPPER BACK MELANOMA ;  Surgeon: Gayland Curry, MD;  Location: [facility];  Service: General;  Laterality: N/A;  . Application of a-cell of back N/A 12/19/2014    Procedure: APPLICATION OF A-CELL OF BACK;  Surgeon: Theodoro Kos, DO;  Location: [place];  Service: Plastics;  Laterality: N/A;     Current Outpatient Prescriptions  Medication Sig Dispense Refill  . CALCIUM PO Take 1 tablet by mouth every morning.    . Cholecalciferol (VITAMIN D PO) Take 1 tablet by mouth every morning.    . Cyanocobalamin (VITAMIN B-12 PO) Take 1 tablet by mouth every morning.    Marland Kitchen ibuprofen  (ADVIL,MOTRIN) 200 MG tablet Take 400-600 mg by mouth every 6 (six) hours as needed for headache or mild pain.    . Multiple Vitamin (MULTIVITAMIN) capsule Take 1 capsule by mouth daily.    . Omega-3 Fatty Acids (OMEGA 3 PO) Take 1 tablet by mouth every morning.    . Vitamin K, Phytonadione, 100 MCG TABS Take 100 mcg by mouth.     No current facility-administered medications for this visit.    Allergies:   Codeine and Erythromycin    Social History:  The patient  reports that he quit smoking about 43 years ago. He does not have any smokeless tobacco history on file. He reports that he drinks alcohol. He reports that he does not use illicit drugs.   Family History:  The patient's family history includes Heart Problems in his mother; Osteoporosis in his father; Other in his maternal grandfather.    ROS:  Please see the history of present illness.   Otherwise, review of systems are positive for none.   All other systems are reviewed and negative.    PHYSICAL EXAM: VS:  BP 118/60 mmHg  Pulse 69  Ht 6' (1.829 m)  Wt 84.369 kg (186 lb)  BMI 25.22 kg/m2 , BMI Body mass index is 25.22 kg/(m^2). Affect appropriate Healthy:  appears stated age 40: normal Neck  supple with no adenopathy JVP normal no bruits no thyromegaly Lungs clear with no wheezing and good diaphragmatic motion Heart:  S1/S2 no murmur, no rub, gallop or click PMI normal Abdomen: benighn, BS positve, no tenderness, no AAA no bruit.  No HSM or HJR Distal pulses intact with no bruits No edema Neuro non-focal Skin warm and dry No muscular weakness    EKG:   SR rate 69 normal ECG    Recent Labs: 09/26/2014: ALT 23; BUN 17; Creatinine, Ser 0.85; Platelets 233; Potassium 4.5; Sodium 138 12/19/2014: Hemoglobin 16.4    Lipid Panel No results found for: CHOL, TRIG, HDL, CHOLHDL, VLDL, LDLCALC, LDLDIRECT    Wt Readings from Last 3 Encounters:  05/26/15 84.369 kg (186 lb)  12/19/14 74.39 kg (164 lb)  10/04/14  82.555 kg (182 lb)      Other studies Reviewed: Additional studies/ records that were reviewed today include: Records from [facility] center .    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  TTE with ASA and ? Right cor triatriatum  Reasonable to proceed with TEE to further assess given stroke.  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  Continue ASA and statin but 20 mg lipitor should be fine  Will also have him see EP regarding possible loop recorder although ECG and clinical scenario would not suggest PAF as etiology 2. Chol:  LDL 124 start lipitor 20 mg daily labs in 3 months     Current medicines are reviewed at length with the patient today.  The patient does not have concerns regarding medicines.  The following changes have been made:  Lipitor 20 mg daily  Labs/ tests ordered today include: TEE scheduled at [facility] orders written   Orders Placed This Encounter  Procedures  . EKG 12-Lead     Disposition:   FU with EP for loop recorder and me post TEE      Signed, Jenkins Rouge, MD  05/26/2015 5:00 PM    [address], [place], [place]  [address] Phone: [phone]; Fax: [phone]

## 2015-05-26 ENCOUNTER — Ambulatory Visit (INDEPENDENT_AMBULATORY_CARE_PROVIDER_SITE_OTHER): Payer: Medicare Other | Admitting: Cardiovascular Disease

## 2015-05-26 ENCOUNTER — Encounter: Payer: Self-pay | Admitting: *Deleted

## 2015-05-26 ENCOUNTER — Encounter: Payer: Self-pay | Admitting: Cardiovascular Disease

## 2015-05-26 VITALS — BP 118/60 | HR 69 | Ht 72.0 in | Wt 186.0 lb

## 2015-05-26 DIAGNOSIS — I639 Cerebral infarction, unspecified: Secondary | ICD-10-CM

## 2015-05-26 DIAGNOSIS — R931 Abnormal findings on diagnostic imaging of heart and coronary circulation: Secondary | ICD-10-CM

## 2015-05-26 NOTE — Patient Instructions (Signed)
Medication Instructions:  NO CHANGES  Labwork: NONE  Testing/Procedures: Your physician has requested that you have a TEE. During a TEE, sound waves are used to create images of your heart. It provides your doctor with information about the size and shape of your heart and how well your heart's chambers and valves are working. In this test, a transducer is attached to the end of a flexible tube that's guided down your throat and into your esophagus (the tube leading from you mouth to your stomach) to get a more detailed image of your heart. You are not awake for the procedure. Please see the instruction sheet given to you today. For further information please visit [email or web address].    Follow-Up: Your physician recommends that you schedule a follow-up appointment in:  NEXT  AVAILABLE   WITH   DR  Rayann Heman   POSSIBLE  LOOP  RECORDER  Any Other Special Instructions Will Be Listed Below (If Applicable).

## 2015-05-27 ENCOUNTER — Telehealth: Payer: Self-pay | Admitting: Cardiovascular Disease

## 2015-05-27 MED ORDER — ATORVASTATIN CALCIUM 20 MG PO TABS: 20.0000 mg | ORAL_TABLET | Freq: Every day | ORAL | Status: DC

## 2015-05-27 NOTE — Telephone Encounter (Signed)
Called patient about his atorvastatin. Reviewed Dr. Kyla Balzarine office note, "2. Chol: LDL 124 start lipitor 20 mg daily ". Medication ordered and sent to patient's pharmacy. Patient verbalized understanding.

## 2015-05-27 NOTE — Telephone Encounter (Signed)
New Message        Pt calling stating that he saw Dr. Johnsie Cancel yesterday and a prescription was suppose to be called in for Vistatin 20 mg, pt states that his pharmacy hasn't received anything yet. Please call backa nd advise.

## 2015-06-09 ENCOUNTER — Other Ambulatory Visit: Payer: Self-pay | Admitting: Cardiovascular Disease

## 2015-06-11 MED ORDER — SODIUM CHLORIDE 0.9 % IV SOLN
INTRAVENOUS | Status: DC
  Administered 2015-06-12: 500 mL via INTRAVENOUS

## 2015-06-12 ENCOUNTER — Encounter (HOSPITAL_COMMUNITY): Payer: Self-pay | Admitting: *Deleted

## 2015-06-12 ENCOUNTER — Ambulatory Visit (HOSPITAL_COMMUNITY)
Admission: RE | Admit: 2015-06-12 | Discharge: 2015-06-12 | Disposition: A | Discharge status: Home / Self Care | Payer: Medicare Other | Source: Ambulatory Visit | Attending: Cardiovascular Disease | Admitting: Cardiovascular Disease

## 2015-06-12 ENCOUNTER — Encounter (HOSPITAL_COMMUNITY)
Admission: RE | Disposition: A | Discharge status: Home / Self Care | Payer: Self-pay | Source: Ambulatory Visit | Attending: Cardiovascular Disease

## 2015-06-12 DIAGNOSIS — Z0389 Encounter for observation for other suspected diseases and conditions ruled out: Secondary | ICD-10-CM | POA: Diagnosis present

## 2015-06-12 DIAGNOSIS — I081 Rheumatic disorders of both mitral and tricuspid valves: Secondary | ICD-10-CM | POA: Insufficient documentation

## 2015-06-12 DIAGNOSIS — N400 Enlarged prostate without lower urinary tract symptoms: Secondary | ICD-10-CM | POA: Diagnosis not present

## 2015-06-12 DIAGNOSIS — I253 Aneurysm of heart: Secondary | ICD-10-CM | POA: Insufficient documentation

## 2015-06-12 DIAGNOSIS — Z87891 Personal history of nicotine dependence: Secondary | ICD-10-CM | POA: Diagnosis not present

## 2015-06-12 DIAGNOSIS — M8580 Other specified disorders of bone density and structure, unspecified site: Secondary | ICD-10-CM | POA: Insufficient documentation

## 2015-06-12 DIAGNOSIS — E780 Pure hypercholesterolemia: Secondary | ICD-10-CM | POA: Diagnosis not present

## 2015-06-12 DIAGNOSIS — Z85820 Personal history of malignant melanoma of skin: Secondary | ICD-10-CM | POA: Insufficient documentation

## 2015-06-12 DIAGNOSIS — Z79899 Other long term (current) drug therapy: Secondary | ICD-10-CM | POA: Insufficient documentation

## 2015-06-12 DIAGNOSIS — Q242 Cor triatriatum: Secondary | ICD-10-CM | POA: Diagnosis not present

## 2015-06-12 DIAGNOSIS — Q211 Atrial septal defect: Secondary | ICD-10-CM | POA: Diagnosis not present

## 2015-06-12 HISTORY — PX: TEE WITHOUT CARDIOVERSION: SHX5443

## 2015-06-12 SURGERY — ECHOCARDIOGRAM, TRANSESOPHAGEAL
Anesthesia: Moderate Sedation

## 2015-06-12 MED ORDER — MIDAZOLAM HCL 5 MG/ML IJ SOLN
INTRAMUSCULAR | Status: AC
  Filled 2015-06-12: qty 2

## 2015-06-12 MED ORDER — FENTANYL CITRATE (PF) 100 MCG/2ML IJ SOLN
INTRAMUSCULAR | Status: AC
  Filled 2015-06-12: qty 2

## 2015-06-12 MED ORDER — MIDAZOLAM HCL 10 MG/2ML IJ SOLN
INTRAMUSCULAR | Status: DC | PRN
  Administered 2015-06-12 (×2): 2 mg via INTRAVENOUS

## 2015-06-12 MED ORDER — FENTANYL CITRATE (PF) 100 MCG/2ML IJ SOLN
INTRAMUSCULAR | Status: DC | PRN
Start: 2015-06-12 — End: 2015-06-12
  Administered 2015-06-12 (×2): 25 ug via INTRAVENOUS

## 2015-06-12 MED ORDER — DIPHENHYDRAMINE HCL 50 MG/ML IJ SOLN
INTRAMUSCULAR | Status: AC
Start: 2015-06-12 — End: 2015-06-12
  Filled 2015-06-12: qty 1

## 2015-06-12 MED ORDER — BUTAMBEN-TETRACAINE-BENZOCAINE 2-2-14 % EX AERO
INHALATION_SPRAY | CUTANEOUS | Status: DC | PRN
  Administered 2015-06-12: 2 via TOPICAL

## 2015-06-12 NOTE — H&P (View-Only) (Signed)
Patient ID: James Tate, male   DOB: 10/31/1946, 69 y.o.   MRN: [id number]     Cardiology Office Note   Date:  05/26/2015   ID:  James Tate, DOB 1946/10/13, MRN [id number]  PCP:  Simona Huh, MD  Cardiologist:   Jenkins Rouge, MD   No chief complaint on file.     History of Present Illness: James Tate is a 69 y.o. male who presents for evaluation of abnormal echo Reviewed from  04/04/15 read by myself Indication of echo suggested CVA  Study Conclusions  - Left ventricle: The cavity size was normal. Wall thickness was increased in a pattern of mild LVH. Systolic function was normal. The estimated ejection fraction was in the range of 55% to 60%. - Mitral valve: There was mild regurgitation. - Left atrium: The atrium was mildly dilated. 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    Past Medical History  Diagnosis Date  . Osteopenia   . Melanoma in situ of back   . BPH (benign prostatic hyperplasia)   . Flat feet   . Wears hearing aid     both    Past Surgical History  Procedure Laterality Date  . Tonsillectomy    . Cholecystectomy  1996  . Lasik  2003  . Parathyroidectomy  2004  . Melanoma excision N/A 10/04/2014    Procedure: WIDE LOCAL EXCISION OF UPPER BACK MELANOMA ;  Surgeon: Gayland Curry, MD;  Location: [facility];  Service: General;  Laterality: N/A;  . Application of a-cell of back N/A 12/19/2014    Procedure: APPLICATION OF A-CELL OF BACK;  Surgeon: Theodoro Kos, DO;  Location: [place];  Service: Plastics;  Laterality: N/A;     Current Outpatient Prescriptions  Medication Sig Dispense Refill  . CALCIUM PO Take 1 tablet by mouth every morning.    . Cholecalciferol (VITAMIN D PO) Take 1 tablet by mouth every morning.    . Cyanocobalamin (VITAMIN B-12 PO) Take 1 tablet by mouth every morning.    Marland Kitchen ibuprofen  (ADVIL,MOTRIN) 200 MG tablet Take 400-600 mg by mouth every 6 (six) hours as needed for headache or mild pain.    . Multiple Vitamin (MULTIVITAMIN) capsule Take 1 capsule by mouth daily.    . Omega-3 Fatty Acids (OMEGA 3 PO) Take 1 tablet by mouth every morning.    . Vitamin K, Phytonadione, 100 MCG TABS Take 100 mcg by mouth.     No current facility-administered medications for this visit.    Allergies:   Codeine and Erythromycin    Social History:  The patient  reports that he quit smoking about 43 years ago. He does not have any smokeless tobacco history on file. He reports that he drinks alcohol. He reports that he does not use illicit drugs.   Family History:  The patient's family history includes Heart Problems in his mother; Osteoporosis in his father; Other in his maternal grandfather.    ROS:  Please see the history of present illness.   Otherwise, review of systems are positive for none.   All other systems are reviewed and negative.    PHYSICAL EXAM: VS:  BP 118/60 mmHg  Pulse 69  Ht 6' (1.829 m)  Wt 84.369 kg (186 lb)  BMI 25.22 kg/m2 , BMI Body mass index is 25.22 kg/(m^2). Affect appropriate Healthy:  appears stated age 69: normal Neck  supple with no adenopathy JVP normal no bruits no thyromegaly Lungs clear with no wheezing and good diaphragmatic motion Heart:  S1/S2 no murmur, no rub, gallop or click PMI normal Abdomen: benighn, BS positve, no tenderness, no AAA no bruit.  No HSM or HJR Distal pulses intact with no bruits No edema Neuro non-focal Skin warm and dry No muscular weakness    EKG:   SR rate 69 normal ECG    Recent Labs: 09/26/2014: ALT 23; BUN 17; Creatinine, Ser 0.85; Platelets 233; Potassium 4.5; Sodium 138 12/19/2014: Hemoglobin 16.4    Lipid Panel No results found for: CHOL, TRIG, HDL, CHOLHDL, VLDL, LDLCALC, LDLDIRECT    Wt Readings from Last 3 Encounters:  05/26/15 84.369 kg (186 lb)  12/19/14 74.39 kg (164 lb)  10/04/14  82.555 kg (182 lb)      Other studies Reviewed: Additional studies/ records that were reviewed today include: Records from [facility] center .    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  TTE with ASA and ? Right cor triatriatum  Reasonable to proceed with TEE to further assess given stroke.  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  Continue ASA and statin but 20 mg lipitor should be fine  Will also have him see EP regarding possible loop recorder although ECG and clinical scenario would not suggest PAF as etiology 2. Chol:  LDL 124 start lipitor 20 mg daily labs in 3 months     Current medicines are reviewed at length with the patient today.  The patient does not have concerns regarding medicines.  The following changes have been made:  Lipitor 20 mg daily  Labs/ tests ordered today include: TEE scheduled at [facility] orders written   Orders Placed This Encounter  Procedures  . EKG 12-Lead     Disposition:   FU with EP for loop recorder and me post TEE      Signed, Jenkins Rouge, MD  05/26/2015 5:00 PM    [address], [place], [place]  [address] Phone: [phone]; Fax: [phone]

## 2015-06-12 NOTE — CV Procedure (Signed)
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

## 2015-06-12 NOTE — Progress Notes (Signed)
  Echocardiogram Echocardiogram Transesophageal has been performed.  James Tate 06/12/2015, 9:48 AM

## 2015-06-12 NOTE — Discharge Instructions (Addendum)
F/U Dr Burt Knack for evaluation PFO F/U Dr Johnsie Cancel 6 months Continue ASA and cholesterol med  Transesophageal Echocardiogram Transesophageal echocardiography (TEE) is a picture test of your heart using sound waves. The pictures taken can give very detailed pictures of your heart. This can help your doctor see if there are problems with your heart. TEE can check:  If your heart has blood clots in it.  How well your heart valves are working.  If you have an infection on the inside of your heart.  Some of the major arteries of your heart.  If your heart valve is working after a Office manager.  Your heart before a procedure that uses a shock to your heart to get the rhythm back to normal. BEFORE THE PROCEDURE  Do not eat or drink for 6 hours before the procedure or as told by your doctor.  Make plans to have someone drive you home after the procedure. Do not drive yourself home.  An IV tube will be put in your arm. PROCEDURE  You will be given a medicine to help you relax (sedative). It will be given through the IV tube.  A numbing medicine will be sprayed or gargled in the back of your throat to help numb it.  The tip of the probe is placed into the back of your mouth. You will be asked to swallow. This helps to pass the probe into your esophagus.  Once the tip of the probe is in the right place, your doctor can take pictures of your heart.  You may feel pressure at the back of your throat. AFTER THE PROCEDURE  You will be taken to a recovery area so the sedative can wear off.  Your throat may be sore and scratchy. This will go away slowly over time.  You will go home when you are fully awake and able to swallow liquids.  You should have someone stay with you for the next 24 hours.  Do not drive or operate machinery for the next 24 hours. Document Released: 09/19/2009 Document Revised: 11/27/2013 Document Reviewed: 05/24/2013 [facility] Patient Information 2015 [place], [place]. This  information is not intended to replace advice given to you by your health care provider. Make sure you discuss any questions you have with your health care provider.  Conscious Sedation, Adult, Care After Refer to this sheet in the next few weeks. These instructions provide you with information on caring for yourself after your procedure. Your health care provider may also give you more specific instructions. Your treatment has been planned according to current medical practices, but problems sometimes occur. Call your health care provider if you have any problems or questions after your procedure. WHAT TO EXPECT AFTER THE PROCEDURE  After your procedure:  You may feel sleepy, clumsy, and have poor balance for several hours.  Vomiting may occur if you eat too soon after the procedure. HOME CARE INSTRUCTIONS  Do not participate in any activities where you could become injured for at least 24 hours. Do not:  Drive.  Swim.  Ride a bicycle.  Operate heavy machinery.  Cook.  Use power tools.  Climb ladders.  Work from a high place.  Do not make important decisions or sign legal documents until you are improved.  If you vomit, drink water, juice, or soup when you can drink without vomiting. Make sure you have little or no nausea before eating solid foods.  Only take over-the-counter or prescription medicines for pain, discomfort, or fever as directed  by your health care provider.  Make sure you and your family fully understand everything about the medicines given to you, including what side effects may occur.  You should not drink alcohol, take sleeping pills, or take medicines that cause drowsiness for at least 24 hours.  If you smoke, do not smoke without supervision.  If you are feeling better, you may resume normal activities 24 hours after you were sedated.  Keep all appointments with your health care provider. SEEK MEDICAL CARE IF:  Your skin is pale or bluish in  color.  You continue to feel nauseous or vomit.  Your pain is getting worse and is not helped by medicine.  You have bleeding or swelling.  You are still sleepy or feeling clumsy after 24 hours. SEEK IMMEDIATE MEDICAL CARE IF:  You develop a rash.  You have difficulty breathing.  You develop any type of allergic problem.  You have a fever. MAKE SURE YOU:  Understand these instructions.  Will watch your condition.  Will get help right away if you are not doing well or get worse. Document Released: 09/12/2013 Document Reviewed: 09/12/2013 [facility] Patient Information 2015 [place], [place]. This information is not intended to replace advice given to you by your health care provider. Make sure you discuss any questions you have with your health care provider.

## 2015-06-12 NOTE — Interval H&P Note (Signed)
History and Physical Interval Note:  06/12/2015 8:47 AM  James Tate  has presented today for surgery, with the diagnosis of POSSIBLE STROKE   The various methods of treatment have been discussed with the patient and family. After consideration of risks, benefits and other options for treatment, the patient has consented to  Procedure(s): TRANSESOPHAGEAL ECHOCARDIOGRAM (TEE) (N/A) as a surgical intervention .  The patient's history has been reviewed, patient examined, no change in status, stable for surgery.  I have reviewed the patient's chart and labs.  Questions were answered to the patient's satisfaction.     Jenkins Rouge

## 2015-06-13 ENCOUNTER — Encounter (HOSPITAL_COMMUNITY): Payer: Self-pay | Admitting: Cardiovascular Disease

## 2015-06-13 ENCOUNTER — Telehealth: Payer: Self-pay | Admitting: Cardiovascular Disease

## 2015-06-13 NOTE — Telephone Encounter (Signed)
PT  AWARE   NEEDS  PFO   CLOSURE WILL AWAIT  FINAL TEE REPORT AND MAIL PT   COPY  AT PT 'S REQUEST

## 2015-06-13 NOTE — Telephone Encounter (Signed)
New message ° ° ° ° °Want test results °

## 2015-06-16 ENCOUNTER — Other Ambulatory Visit: Payer: Self-pay | Admitting: *Deleted

## 2015-06-16 MED ORDER — ATORVASTATIN CALCIUM 20 MG PO TABS: 20.0000 mg | ORAL_TABLET | Freq: Every day | ORAL | Status: DC

## 2015-06-30 DIAGNOSIS — M1712 Unilateral primary osteoarthritis, left knee: Secondary | ICD-10-CM | POA: Diagnosis not present

## 2015-07-02 ENCOUNTER — Ambulatory Visit (HOSPITAL_COMMUNITY)
Admission: AD | Admit: 2015-07-02 | Discharge: 2015-07-02 | Disposition: A | Discharge status: Home / Self Care | Payer: Medicare Other | Source: Ambulatory Visit | Attending: Internal Medicine | Admitting: Internal Medicine

## 2015-07-02 ENCOUNTER — Encounter: Payer: Self-pay | Admitting: Internal Medicine

## 2015-07-02 ENCOUNTER — Ambulatory Visit (INDEPENDENT_AMBULATORY_CARE_PROVIDER_SITE_OTHER): Payer: Medicare Other | Admitting: Internal Medicine

## 2015-07-02 ENCOUNTER — Encounter (HOSPITAL_COMMUNITY)
Admission: AD | Disposition: A | Discharge status: Home / Self Care | Payer: Self-pay | Source: Ambulatory Visit | Attending: Internal Medicine

## 2015-07-02 VITALS — BP 110/58 | Ht 72.0 in | Wt 187.6 lb

## 2015-07-02 DIAGNOSIS — I638 Other cerebral infarction: Secondary | ICD-10-CM | POA: Insufficient documentation

## 2015-07-02 DIAGNOSIS — N400 Enlarged prostate without lower urinary tract symptoms: Secondary | ICD-10-CM | POA: Diagnosis not present

## 2015-07-02 DIAGNOSIS — I639 Cerebral infarction, unspecified: Secondary | ICD-10-CM | POA: Diagnosis not present

## 2015-07-02 DIAGNOSIS — Q211 Atrial septal defect: Secondary | ICD-10-CM | POA: Diagnosis not present

## 2015-07-02 DIAGNOSIS — M8580 Other specified disorders of bone density and structure, unspecified site: Secondary | ICD-10-CM | POA: Insufficient documentation

## 2015-07-02 DIAGNOSIS — Z85820 Personal history of malignant melanoma of skin: Secondary | ICD-10-CM | POA: Insufficient documentation

## 2015-07-02 DIAGNOSIS — Z87891 Personal history of nicotine dependence: Secondary | ICD-10-CM | POA: Insufficient documentation

## 2015-07-02 HISTORY — PX: EP IMPLANTABLE DEVICE: SHX172B

## 2015-07-02 SURGERY — LOOP RECORDER INSERTION

## 2015-07-02 MED ORDER — LIDOCAINE-EPINEPHRINE 1 %-1:100000 IJ SOLN
INTRAMUSCULAR | Status: AC
  Filled 2015-07-02: qty 1

## 2015-07-02 MED ORDER — LIDOCAINE-EPINEPHRINE 1 %-1:100000 IJ SOLN
INTRAMUSCULAR | Status: DC | PRN
  Administered 2015-07-02: 10 mL

## 2015-07-02 SURGICAL SUPPLY — 2 items
LOOP REVEAL LINQSYS (Prosthesis & Implant Heart) ×3 IMPLANT
PACK LOOP INSERTION (CUSTOM PROCEDURE TRAY) ×3 IMPLANT

## 2015-07-02 NOTE — Interval H&P Note (Signed)
History and Physical Interval Note:  07/02/2015 11:57 AM  James Tate  has presented today for surgery, with the diagnosis of stroke  The various methods of treatment have been discussed with the patient and family. After consideration of risks, benefits and other options for treatment, the patient has consented to  Procedure(s): Loop Recorder Insertion (N/A) as a surgical intervention .  The patient's history has been reviewed, patient examined, no change in status, stable for surgery.  I have reviewed the patient's chart and labs.  Questions were answered to the patient's satisfaction.     Thompson Grayer

## 2015-07-02 NOTE — Discharge Instructions (Signed)
Incision Care °An incision is when a surgeon cuts into your body tissues. After surgery, the incision needs to be cared for properly to prevent infection.  °HOME CARE INSTRUCTIONS  °· Take all medicine as directed by your caregiver. Only take over-the-counter or prescription medicines for pain, discomfort, or fever as directed by your caregiver. °· Do not remove your bandage (dressing) or get your incision wet until your surgeon gives you permission. In the event that your dressing becomes wet, dirty, or starts to smell, change the dressing and call your surgeon for instructions as soon as possible. °· Take showers. Do not take tub baths, swim, or do anything that may soak the wound until it is healed. °· Resume your normal diet and activities as directed or allowed. °· Avoid lifting any weight until you are instructed otherwise. °· Use anti-itch antihistamine medicine as directed by your caregiver. The wound may itch when it is healing. Do not pick or scratch at the wound. °· Follow up with your caregiver for stitch (suture) or staple removal as directed. °· Drink enough fluids to keep your urine clear or pale yellow. °SEEK MEDICAL CARE IF:  °· You have redness, swelling, or increasing pain in the wound that is not controlled with medicine. °· You have drainage, blood, or pus coming from the wound that lasts longer than 1 day. °· You develop muscle aches, chills, or a general ill feeling. °· You notice a bad smell coming from the wound or dressing. °· Your wound edges separate after the sutures, staples, or skin adhesive strips have been removed. °· You develop persistent nausea or vomiting. °SEEK IMMEDIATE MEDICAL CARE IF:  °· You have a fever. °· You develop a rash. °· You develop dizzy episodes or faint while standing. °· You have difficulty breathing. °· You develop any reaction or side effects to medicine given. °MAKE SURE YOU:  °· Understand these instructions. °· Will watch your condition. °· Will get help  right away if you are not doing well or get worse. °Document Released: 06/11/2005 Document Revised: 02/14/2012 Document Reviewed: 01/16/2014 °ExitCare® Patient Information ©2015 [employer]. This information is not intended to replace advice given to you by your health care provider. Make sure you discuss any questions you have with your health care provider. ° ° °

## 2015-07-02 NOTE — Progress Notes (Signed)
ELECTROPHYSIOLOGY CONSULT NOTE  Patient ID: James Tate MRN: [id number], DOB/AGE: 1946-09-21   Admit date: (Not on file) Date of Consult: 07/02/2015  Primary Physician: Simona Huh, MD Primary Cardiologist: Dr Johnsie Cancel Reason for Consultation: Cryptogenic stroke; recommendations regarding Implantable Loop Recorder  History of Present Illness James Tate was admitted to a hospital with acute CVA in [place] 4/16.  He reports that while on vacation, he has acute R facial numbness with facial droop and slurred speech.  He had MRI/ dopplers/ telemetry which were unrevealing for the cause of his stroke.  He was placed on ASA and atorvastatin.  He had full recovery and returned home.  He has been evaluated by Dr Johnsie Cancel and TEE was performed.  This revealed cor triatriatum and PFO.  No real source for his stroke was found.  he  Was monitored on telemetry which demonstrated no arrhythmias. No cause has been identified.  EP has been asked to evaluate for placement of an implantable loop recorder to monitor for atrial fibrillation.  Past Medical History Past Medical History  Diagnosis Date  . Osteopenia     resolved with medical therapy (calcium and fosamax)  . Melanoma in situ of back   . BPH (benign prostatic hyperplasia)   . Flat feet   . Wears hearing aid     both  . PFO (patent foramen ovale)   . Cor triatriatum   . 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 Tonsillectomy    . Cholecystectomy  1996  . Lasik  2003  . Parathyroidectomy  2004  . Melanoma excision N/A 10/04/2014    Procedure: WIDE LOCAL EXCISION OF UPPER BACK MELANOMA ;  Surgeon: Gayland Curry, MD;  Location: [facility];  Service: General;  Laterality: N/A;  . Application of a-cell of back N/A 12/19/2014    Procedure: APPLICATION OF A-CELL OF BACK;  Surgeon: Theodoro Kos, DO;  Location: [facility];  Service: Plastics;  Laterality: N/A;  . Tee  without cardioversion N/A 06/12/2015    Procedure: TRANSESOPHAGEAL ECHOCARDIOGRAM (TEE);  Surgeon: Josue Hector, MD;  Location: [facility];  Service: Cardiovascular;  Laterality: N/A;    Allergies/Intolerances Allergies  Allergen Reactions  . Codeine Other (See Comments)    Headache.   . Erythromycin     Childhood allergy - unknown   Inpatient Medications   Social History History   Social History  . Marital Status: Married    Spouse Name: N/A  . Number of Children: N/A  . Years of Education: N/A   Occupational History  . Not on file.   Social History Main Topics  . Smoking status: Former Smoker    Quit date: 09/27/1971  . Smokeless tobacco: Not on file  . Alcohol Use: Yes     Comment: OCCASIONAL  . Drug Use: No  . Sexual Activity: Not on file   Other Topics Concern  . Not on file   Social History Narrative   Pt lives in [place] with spouse.  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     Review of Systems General: No chills, fever, night sweats or weight changes  Cardiovascular:  No chest pain, dyspnea on exertion, edema, orthopnea, palpitations, paroxysmal nocturnal dyspnea Dermatological: No rash, lesions or masses Respiratory: No cough, dyspnea Urologic: No hematuria, dysuria Abdominal: No nausea, vomiting, diarrhea, bright red blood per rectum, melena, or hematemesis Neurologic: No visual changes, weakness, changes  in mental status All other systems reviewed and are otherwise negative except as noted above.  Physical Exam Blood pressure 110/58, height 6' (1.829 m), weight 85.095 kg (187 lb 9.6 oz).  General: Well developed, well appearing 69 y.o. male in no acute distress. HEENT: Normocephalic, atraumatic. EOMs intact. Sclera nonicteric. Oropharynx clear.  Neck: Supple without bruits. No JVD. Lungs: Respirations regular and unlabored, CTA bilaterally. No wheezes, rales or rhonchi. Heart: RRR. S1, S2 present. No murmurs, rub, S3 or S4. Abdomen: Soft,  non-tender, non-distended. BS present x 4 quadrants. No hepatosplenomegaly.  Extremities: No clubbing, cyanosis or edema. DP/PT/Radials 2+ and equal bilaterally. Psych: Normal affect. Neuro: Alert and oriented X 3. Moves all extremities spontaneously. Musculoskeletal: No kyphosis. Skin: Intact. Warm and dry. No rashes or petechiae in exposed areas.   Labs Lab Results  Component Value Date   WBC 6.5 09/26/2014   HGB 16.4 12/19/2014   HCT 42.6 09/26/2014   MCV 86.8 09/26/2014   PLT 233 09/26/2014   No results for input(s): NA, K, CL, CO2, BUN, CREATININE, CALCIUM, PROT, BILITOT, ALKPHOS, ALT, AST, GLUCOSE in the last 168 hours.  Invalid input(s): LABALBU No results for input(s): INR in the last 72 hours.  Radiology/Studies No results found.  Echocardiogram  Reviewed as above  12-lead ECG sinus rhythm  Assessment and Plan 1. Cryptogenic stroke The indication for loop recorder insertion / monitoring for AF in setting of cryptogenic stroke was discussed with the patient. The loop recorder insertion procedure was reviewed in detail including risks and benefits. These risks include but are not limited to bleeding and infection. The patient expressed verbal understanding and agrees to proceed. The patient was also counseled regarding wound care and device follow-up.  Army Fossa MD 07/02/2015, 10:19 AM

## 2015-07-02 NOTE — Patient Instructions (Signed)
Medication Instructions:  Your physician recommends that you continue on your current medications as directed. Please refer to the Current Medication list given to you today.   Labwork: None ordered  Testing/Procedures: LINQ implant today.  Please go to [employer] when you leave office and they will direct you to Short Stay  Follow-Up: Your physician recommends that you schedule a follow-up appointment in: 7-14 days in device clinic for wound check   Any Other Special Instructions Will Be Listed Below (If Applicable).

## 2015-07-02 NOTE — H&P (View-Only) (Signed)
ELECTROPHYSIOLOGY CONSULT NOTE  Patient ID: James Tate MRN: [id number], DOB/AGE: 1946/04/12   Admit date: (Not on file) Date of Consult: 07/02/2015  Primary Physician: Simona Huh, MD Primary Cardiologist: Dr Johnsie Cancel Reason for Consultation: Cryptogenic stroke; recommendations regarding Implantable Loop Recorder  History of Present Illness James Tate was admitted to a hospital with acute CVA in [place] 4/16.  He reports that while on vacation, he has acute R facial numbness with facial droop and slurred speech.  He had MRI/ dopplers/ telemetry which were unrevealing for the cause of his stroke.  He was placed on ASA and atorvastatin.  He had full recovery and returned home.  He has been evaluated by Dr Johnsie Cancel and TEE was performed.  This revealed cor triatriatum and PFO.  No real source for his stroke was found.  he  Was monitored on telemetry which demonstrated no arrhythmias. No cause has been identified.  EP has been asked to evaluate for placement of an implantable loop recorder to monitor for atrial fibrillation.  Past Medical History Past Medical History  Diagnosis Date  . Osteopenia     resolved with medical therapy (calcium and fosamax)  . Melanoma in situ of back   . BPH (benign prostatic hyperplasia)   . Flat feet   . Wears hearing aid     both  . PFO (patent foramen ovale)   . Cor triatriatum   . 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 Tonsillectomy    . Cholecystectomy  1996  . Lasik  2003  . Parathyroidectomy  2004  . Melanoma excision N/A 10/04/2014    Procedure: WIDE LOCAL EXCISION OF UPPER BACK MELANOMA ;  Surgeon: Gayland Curry, MD;  Location: [facility];  Service: General;  Laterality: N/A;  . Application of a-cell of back N/A 12/19/2014    Procedure: APPLICATION OF A-CELL OF BACK;  Surgeon: Theodoro Kos, DO;  Location: [place] [place];  Service: Plastics;  Laterality: N/A;  . Tee  without cardioversion N/A 06/12/2015    Procedure: TRANSESOPHAGEAL ECHOCARDIOGRAM (TEE);  Surgeon: Josue Hector, MD;  Location: [facility] ENDOSCOPY;  Service: Cardiovascular;  Laterality: N/A;    Allergies/Intolerances Allergies  Allergen Reactions  . Codeine Other (See Comments)    Headache.   . Erythromycin     Childhood allergy - unknown   Inpatient Medications   Social History History   Social History  . Marital Status: Married    Spouse Name: N/A  . Number of Children: N/A  . Years of Education: N/A   Occupational History  . Not on file.   Social History Main Topics  . Smoking status: Former Smoker    Quit date: 09/27/1971  . Smokeless tobacco: Not on file  . Alcohol Use: Yes     Comment: OCCASIONAL  . Drug Use: No  . Sexual Activity: Not on file   Other Topics Concern  . Not on file   Social History Narrative   Pt lives in [place] with spouse.  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     Review of Systems General: No chills, fever, night sweats or weight changes  Cardiovascular:  No chest pain, dyspnea on exertion, edema, orthopnea, palpitations, paroxysmal nocturnal dyspnea Dermatological: No rash, lesions or masses Respiratory: No cough, dyspnea Urologic: No hematuria, dysuria Abdominal: No nausea, vomiting, diarrhea, bright red blood per rectum, melena, or hematemesis Neurologic: No visual changes, weakness, changes  in mental status All other systems reviewed and are otherwise negative except as noted above.  Physical Exam Blood pressure 110/58, height 6' (1.829 m), weight 85.095 kg (187 lb 9.6 oz).  General: Well developed, well appearing 69 y.o. male in no acute distress. HEENT: Normocephalic, atraumatic. EOMs intact. Sclera nonicteric. Oropharynx clear.  Neck: Supple without bruits. No JVD. Lungs: Respirations regular and unlabored, CTA bilaterally. No wheezes, rales or rhonchi. Heart: RRR. S1, S2 present. No murmurs, rub, S3 or S4. Abdomen: Soft,  non-tender, non-distended. BS present x 4 quadrants. No hepatosplenomegaly.  Extremities: No clubbing, cyanosis or edema. DP/PT/Radials 2+ and equal bilaterally. Psych: Normal affect. Neuro: Alert and oriented X 3. Moves all extremities spontaneously. Musculoskeletal: No kyphosis. Skin: Intact. Warm and dry. No rashes or petechiae in exposed areas.   Labs Lab Results  Component Value Date   WBC 6.5 09/26/2014   HGB 16.4 12/19/2014   HCT 42.6 09/26/2014   MCV 86.8 09/26/2014   PLT 233 09/26/2014   No results for input(s): NA, K, CL, CO2, BUN, CREATININE, CALCIUM, PROT, BILITOT, ALKPHOS, ALT, AST, GLUCOSE in the last 168 hours.  Invalid input(s): LABALBU No results for input(s): INR in the last 72 hours.  Radiology/Studies No results found.  Echocardiogram  Reviewed as above  12-lead ECG sinus rhythm  Assessment and Plan 1. Cryptogenic stroke The indication for loop recorder insertion / monitoring for AF in setting of cryptogenic stroke was discussed with the patient. The loop recorder insertion procedure was reviewed in detail including risks and benefits. These risks include but are not limited to bleeding and infection. The patient expressed verbal understanding and agrees to proceed. The patient was also counseled regarding wound care and device follow-up.  Army Fossa MD 07/02/2015, 10:19 AM

## 2015-07-08 DIAGNOSIS — L814 Other melanin hyperpigmentation: Secondary | ICD-10-CM | POA: Diagnosis not present

## 2015-07-08 DIAGNOSIS — L570 Actinic keratosis: Secondary | ICD-10-CM | POA: Diagnosis not present

## 2015-07-08 DIAGNOSIS — Z08 Encounter for follow-up examination after completed treatment for malignant neoplasm: Secondary | ICD-10-CM | POA: Diagnosis not present

## 2015-07-08 DIAGNOSIS — Z85820 Personal history of malignant melanoma of skin: Secondary | ICD-10-CM | POA: Diagnosis not present

## 2015-07-08 DIAGNOSIS — D225 Melanocytic nevi of trunk: Secondary | ICD-10-CM | POA: Diagnosis not present

## 2015-07-09 ENCOUNTER — Ambulatory Visit (INDEPENDENT_AMBULATORY_CARE_PROVIDER_SITE_OTHER): Payer: Medicare Other | Admitting: *Deleted

## 2015-07-09 DIAGNOSIS — I639 Cerebral infarction, unspecified: Secondary | ICD-10-CM

## 2015-07-11 LAB — CUP PACEART INCLINIC DEVICE CHECK
MDC IDC SESS DTM: 20160803161200
MDC IDC SET ZONE DETECTION INTERVAL: 3000 ms
Zone Setting Detection Interval: 2000 ms
Zone Setting Detection Interval: 370 ms

## 2015-07-11 NOTE — Progress Notes (Signed)
Wound check in clinic s/p ILR implant. Steri strips removed. Wound well healed without redness or edema. Normal device function. Pt with 0 tachy episodes; 0 brady episodes; 0 asystole; 0 symptom episodes; 0 AF episodes.   Plan to follow up via Carelink QMO and with JA prn.

## 2015-07-28 ENCOUNTER — Encounter: Payer: Self-pay | Admitting: Internal Medicine

## 2015-08-01 ENCOUNTER — Ambulatory Visit (INDEPENDENT_AMBULATORY_CARE_PROVIDER_SITE_OTHER): Payer: Medicare Other | Admitting: *Deleted

## 2015-08-01 DIAGNOSIS — I639 Cerebral infarction, unspecified: Secondary | ICD-10-CM | POA: Diagnosis not present

## 2015-08-06 NOTE — Progress Notes (Signed)
Loop recorder 

## 2015-08-14 DIAGNOSIS — M1712 Unilateral primary osteoarthritis, left knee: Secondary | ICD-10-CM | POA: Diagnosis not present

## 2015-08-15 LAB — CUP PACEART REMOTE DEVICE CHECK: Date Time Interrogation Session: 20160909094947

## 2015-08-15 NOTE — Progress Notes (Signed)
Carelink summary report received. Battery status OK. Normal device function. No new symptom episodes, tachy episodes, brady, or pause episodes. No new AF episodes. Monthly summary reports and ROV with JA PRN. 

## 2015-08-19 ENCOUNTER — Telehealth: Payer: Self-pay | Admitting: Cardiovascular Disease

## 2015-08-19 NOTE — Telephone Encounter (Signed)
New message      Pt had stroke on April 15th; pt is needing to have some plastic surgery done and pt wanting to know if Dr Johnsie Cancel would be okay with him having the procedure done in November Please call to discuss

## 2015-08-19 NOTE — Telephone Encounter (Signed)
Spoke with pt and advised him to have the plastic surgeons office contact us with details on the surgery so that we can get him cleared. Pt verbalized understanding and was in agreement with this plan.

## 2015-08-21 DIAGNOSIS — M1712 Unilateral primary osteoarthritis, left knee: Secondary | ICD-10-CM | POA: Diagnosis not present

## 2015-08-28 DIAGNOSIS — M1712 Unilateral primary osteoarthritis, left knee: Secondary | ICD-10-CM | POA: Diagnosis not present

## 2015-09-01 ENCOUNTER — Ambulatory Visit (INDEPENDENT_AMBULATORY_CARE_PROVIDER_SITE_OTHER): Payer: Medicare Other | Admitting: *Deleted

## 2015-09-01 DIAGNOSIS — I639 Cerebral infarction, unspecified: Secondary | ICD-10-CM

## 2015-09-04 DIAGNOSIS — M1712 Unilateral primary osteoarthritis, left knee: Secondary | ICD-10-CM | POA: Diagnosis not present

## 2015-09-04 NOTE — Progress Notes (Signed)
Loop recorder 

## 2015-09-08 ENCOUNTER — Encounter: Payer: Self-pay | Admitting: Internal Medicine

## 2015-09-09 ENCOUNTER — Telehealth: Payer: Self-pay | Admitting: Cardiovascular Disease

## 2015-09-09 DIAGNOSIS — Z85820 Personal history of malignant melanoma of skin: Secondary | ICD-10-CM | POA: Diagnosis not present

## 2015-09-09 DIAGNOSIS — L905 Scar conditions and fibrosis of skin: Secondary | ICD-10-CM | POA: Diagnosis not present

## 2015-09-09 NOTE — Telephone Encounter (Signed)
New Message  Please call back to discuss the mutual patient

## 2015-09-11 DIAGNOSIS — M1712 Unilateral primary osteoarthritis, left knee: Secondary | ICD-10-CM | POA: Diagnosis not present

## 2015-09-11 LAB — CUP PACEART REMOTE DEVICE CHECK: Date Time Interrogation Session: 20160925160637

## 2015-09-11 NOTE — Progress Notes (Signed)
Carelink summary report received. Battery status OK. Normal device function. No new symptom episodes, tachy episodes, brady, or pause episodes. No new AF episodes. Monthly summary reports and ROV w/ JA PRN. 

## 2015-09-18 ENCOUNTER — Encounter: Payer: Self-pay | Admitting: Cardiovascular Disease

## 2015-09-18 ENCOUNTER — Telehealth: Payer: Self-pay | Admitting: Cardiovascular Disease

## 2015-09-18 ENCOUNTER — Ambulatory Visit (INDEPENDENT_AMBULATORY_CARE_PROVIDER_SITE_OTHER): Payer: Medicare Other | Admitting: Cardiovascular Disease

## 2015-09-18 VITALS — BP 114/62 | HR 68 | Ht 72.0 in | Wt 187.8 lb

## 2015-09-18 DIAGNOSIS — Q211 Atrial septal defect: Secondary | ICD-10-CM | POA: Diagnosis not present

## 2015-09-18 DIAGNOSIS — I639 Cerebral infarction, unspecified: Secondary | ICD-10-CM

## 2015-09-18 DIAGNOSIS — Q2112 Patent foramen ovale: Secondary | ICD-10-CM

## 2015-09-18 NOTE — Telephone Encounter (Signed)
New message    1. What dental office are you calling from? [facility]- calling from dentist office   2. What is your office phone and fax number? [phone]  3. What type of procedure is the patient having performed? Cleaning    4. What date is procedure scheduled? 10.13.2016   5. What is your question (ex. Antibiotics prior to procedure, holding medication-we need to know how long dentist wants pt to hold med)? Does he required any pre-med before cleaning

## 2015-09-18 NOTE — Patient Instructions (Signed)
Medication Instructions:  Your physician recommends that you continue on your current medications as directed. Please refer to the Current Medication list given to you today.  Labwork: No new orders.   Testing/Procedures: No new orders.   Follow-Up: Your physician recommends that you schedule a follow-up appointment as needed with Dr Burt Knack.   Any Other Special Instructions Will Be Listed Below (If Applicable).

## 2015-09-18 NOTE — Progress Notes (Signed)
Cardiology Office Note Date:  09/18/2015   ID:  SHANDY VI, DOB 02/22/1946, MRN [id number]  PCP:  Simona Huh, MD  Cardiologist:  Sherren Mocha, MD    Chief Complaint  Patient presents with  . Evaluation of PFO   History of Present Illness: James Tate is a 69 y.o. male who presents for evaluation of PFO with atrial septal aneurysm.   In April 2016 he presented with left facial numbness, facial droop, and expressive aphasia. Symptoms improved overnight, but speech completely normalized over 1-2 weeks. Workup was essentially unrevealing - told carotid study was 'normal for his age.' An outpatient echo suggested right-sided cor triatriatum, atrial septal aneurysm. A TEE was done and this demonstrates atrial septal aneurysm with PFO and possible agitated saline study.  He otherwise is healthy. No chest pain, chest pressure, shortness of breath, edema, or palpitations.   Past Medical History  Diagnosis Date  . Osteopenia     resolved with medical therapy (calcium and fosamax)  . Melanoma in situ of back ([place])   . BPH (benign prostatic hyperplasia)   . Flat feet   . Wears hearing aid     both  . PFO (patent foramen ovale)   . Cor triatriatum   . Stroke [facility]) 4/16    Past Surgical History  Procedure Laterality Date  . Tonsillectomy    . Cholecystectomy  1996  . Lasik  2003  . Parathyroidectomy  2004  . Melanoma excision N/A 10/04/2014    Procedure: WIDE LOCAL EXCISION OF UPPER BACK MELANOMA ;  Surgeon: Gayland Curry, MD;  Location: [facility];  Service: General;  Laterality: N/A;  . Application of a-cell of back N/A 12/19/2014    Procedure: APPLICATION OF A-CELL OF BACK;  Surgeon: Theodoro Kos, DO;  Location: [place];  Service: Plastics;  Laterality: N/A;  . Tee without cardioversion N/A 06/12/2015    Procedure: TRANSESOPHAGEAL ECHOCARDIOGRAM (TEE);  Surgeon: Josue Hector, MD;  Location: [place];  Service: Cardiovascular;  Laterality: N/A;    . Ep implantable device N/A 07/02/2015    Procedure: Loop Recorder Insertion;  Surgeon: Thompson Grayer, MD;  Location: [place] [facility];  Service: Cardiovascular;  Laterality: N/A;    Current Outpatient Prescriptions  Medication Sig Dispense Refill  . aspirin 81 MG tablet Take 81 mg by mouth daily.    Marland Kitchen atorvastatin (LIPITOR) 20 MG tablet Take 1 tablet (20 mg total) by mouth daily at 6 PM. 90 tablet 3  . CALCIUM PO Take 1 tablet by mouth every morning.    . Cholecalciferol (VITAMIN D PO) Take 1 tablet by mouth every morning.    . Cyanocobalamin (VITAMIN B-12 PO) Take 1 tablet by mouth every morning.    Marland Kitchen ibuprofen (ADVIL,MOTRIN) 200 MG tablet Take 400-600 mg by mouth every 6 (six) hours as needed for headache or mild pain.    . Multiple Vitamin (MULTIVITAMIN) capsule Take 1 capsule by mouth daily.    . Omega-3 Fatty Acids (OMEGA 3 PO) Take 1 tablet by mouth every morning.    . tadalafil (CIALIS) 20 MG tablet Take 20 mg by mouth daily as needed for erectile dysfunction.    . terbinafine (LAMISIL) 250 MG tablet Crush 2 tablets in fungi-nail solution and apply once daily to affected toenails    . triamcinolone (KENALOG) 0.1 % paste Use as directed 1 application in the mouth or throat daily as needed (mouth ulcers).     . Vitamin K, Phytonadione,  100 MCG TABS Take 100 mcg by mouth daily.      No current facility-administered medications for this visit.    Allergies:   Codeine and Erythromycin   Social History:  The patient  reports that he quit smoking about 44 years ago. He does not have any smokeless tobacco history on file. He reports that he drinks alcohol. He reports that he does not use illicit drugs.   Family History:  The patient's family history includes Heart Problems in his mother; Osteoporosis in his father; Other in his maternal grandfather.    ROS:  Please see the history of present illness.  All other systems are reviewed and negative.   PHYSICAL EXAM: VS:  BP 114/62 mmHg   Pulse 68  Ht 6' (1.829 m)  Wt 187 lb 12.8 oz (85.186 kg)  BMI 25.46 kg/m2 , BMI Body mass index is 25.46 kg/(m^2). GEN: Well nourished, well developed, in no acute distress HEENT: normal Neck: no JVD, no masses. No carotid bruits Cardiac: RRR without murmur or gallop                Respiratory:  clear to auscultation bilaterally, normal work of breathing GI: soft, nontender, nondistended, + BS MS: no deformity or atrophy Ext: no pretibial edema, pedal pulses 2+= bilaterally Skin: warm and dry, no rash Neuro:  Strength and sensation are intact Psych: euthymic mood, full affect  EKG:  EKG is not ordered today.  Recent Labs: 09/26/2014: ALT 23; BUN 17; Creatinine, Ser 0.85; Platelets 233; Potassium 4.5; Sodium 138 12/19/2014: Hemoglobin 16.4   Lipid Panel  No results found for: CHOL, TRIG, HDL, CHOLHDL, VLDL, LDLCALC, LDLDIRECT    Wt Readings from Last 3 Encounters:  09/18/15 187 lb 12.8 oz (85.186 kg)  07/02/15 187 lb (84.823 kg)  07/02/15 187 lb 9.6 oz (85.095 kg)     Cardiac Studies Reviewed: 2D Echo 04/04/2015: Study Conclusions - Left ventricle: The cavity size was normal. Wall thickness was increased in a pattern of mild LVH. Systolic function was normal. The estimated ejection fraction was in the range of 55% to 60%. - Mitral valve: There was mild regurgitation. - Left atrium: The atrium was mildly dilated. 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  TEE 7/72016: Normal LV EF 65% No LAA thrombus No aortic debris Normal RV Mild MR/TR Normal aortic root No VSD  Atrial septal aneurysm No fenestrations PFO with positive bubble study Right sided RA cor triatriatum  ASSESSMENT AND PLAN: PFO with atrial septal aneurysm: I have reviewed the patient's echo and TEE studies in detail. He appears to have an incomplete right-sided cor triatriatriatum, a PFO  with positive bubble study, and an atrial septal aneurysm. He is clinically stable on daily ASA and has started a statin drug. I have reviewed clinical trial data, and treatment options of continued medical therapy versus transcatheter closure in detail with the patient and his spouse. We reviewed pros and cons of each approach. Since his TIA occurred without any background medical therapy, I would favor continuation of medical therapy with ASA and a statin drug at this point. If he has recurrent TIA on daily ASA, it would be reasonable to proceed with transcatheter closure in my opinion. He will follow-up as needed.   Current medicines are reviewed with the patient today.  The patient does not have concerns regarding medicines.  Labs/ tests ordered today include:  No  orders of the defined types were placed in this encounter.    Disposition:   FU prn  Signed, Sherren Mocha, MD  09/18/2015 1:44 PM    [facility] [address], [place], [place]  [address] Phone: [phone]; Fax: [phone]

## 2015-09-18 NOTE — Telephone Encounter (Signed)
Pt getting cleaning at dentist office today. James Tate, in office state pt is in chair and presented them with update mediation list and they wanted to verify if het needs any antibiotics before hand.   She clarified that pt is only getting regular cleaning today.  Told her she would need to get clearance for any other procedure as that was not cleared today when speaking with [employer].  James Tate verbalized understanding no questions at this time.

## 2015-09-26 ENCOUNTER — Encounter: Payer: Self-pay | Admitting: Internal Medicine

## 2015-09-30 ENCOUNTER — Ambulatory Visit (INDEPENDENT_AMBULATORY_CARE_PROVIDER_SITE_OTHER): Payer: Medicare Other | Admitting: *Deleted

## 2015-09-30 DIAGNOSIS — I639 Cerebral infarction, unspecified: Secondary | ICD-10-CM

## 2015-10-01 ENCOUNTER — Encounter: Payer: Self-pay | Admitting: Internal Medicine

## 2015-10-01 NOTE — Progress Notes (Signed)
Loop recorder 

## 2015-10-02 NOTE — Telephone Encounter (Signed)
Spoke w/ pt and informed him that his loop recorder remote transmission was done w/ his home monitor and that he doesn't need to come into the office. Pt verbalized understanding.

## 2015-10-03 ENCOUNTER — Encounter: Payer: Self-pay | Admitting: Cardiovascular Disease

## 2015-10-03 DIAGNOSIS — Z23 Encounter for immunization: Secondary | ICD-10-CM | POA: Diagnosis not present

## 2015-10-03 DIAGNOSIS — C4359 Malignant melanoma of other part of trunk: Secondary | ICD-10-CM | POA: Diagnosis not present

## 2015-10-03 DIAGNOSIS — I639 Cerebral infarction, unspecified: Secondary | ICD-10-CM | POA: Diagnosis not present

## 2015-10-03 DIAGNOSIS — E7800 Pure hypercholesterolemia, unspecified: Secondary | ICD-10-CM | POA: Diagnosis not present

## 2015-10-03 DIAGNOSIS — Z131 Encounter for screening for diabetes mellitus: Secondary | ICD-10-CM | POA: Diagnosis not present

## 2015-10-03 DIAGNOSIS — Z0000 Encounter for general adult medical examination without abnormal findings: Secondary | ICD-10-CM | POA: Diagnosis not present

## 2015-10-03 DIAGNOSIS — M8580 Other specified disorders of bone density and structure, unspecified site: Secondary | ICD-10-CM | POA: Diagnosis not present

## 2015-10-03 DIAGNOSIS — Z1389 Encounter for screening for other disorder: Secondary | ICD-10-CM | POA: Diagnosis not present

## 2015-10-03 DIAGNOSIS — Z79899 Other long term (current) drug therapy: Secondary | ICD-10-CM | POA: Diagnosis not present

## 2015-10-06 ENCOUNTER — Encounter: Payer: Self-pay | Admitting: Cardiovascular Disease

## 2015-10-31 LAB — CUP PACEART REMOTE DEVICE CHECK: Date Time Interrogation Session: 20161025160725

## 2015-10-31 NOTE — Progress Notes (Signed)
Carelink summary report received. Battery status OK. Normal device function. No new symptom episodes, tachy episodes, brady, or pause episodes. No new AF episodes. Monthly summary reports and ROV with JA PRN. 

## 2015-11-03 ENCOUNTER — Ambulatory Visit (INDEPENDENT_AMBULATORY_CARE_PROVIDER_SITE_OTHER): Payer: Medicare Other | Admitting: *Deleted

## 2015-11-03 DIAGNOSIS — I639 Cerebral infarction, unspecified: Secondary | ICD-10-CM

## 2015-11-06 NOTE — Progress Notes (Signed)
LOOP RECORDER  

## 2015-11-10 DIAGNOSIS — M8589 Other specified disorders of bone density and structure, multiple sites: Secondary | ICD-10-CM | POA: Diagnosis not present

## 2015-11-10 DIAGNOSIS — M859 Disorder of bone density and structure, unspecified: Secondary | ICD-10-CM | POA: Diagnosis not present

## 2015-12-02 ENCOUNTER — Ambulatory Visit (INDEPENDENT_AMBULATORY_CARE_PROVIDER_SITE_OTHER): Payer: Medicare Other | Admitting: *Deleted

## 2015-12-02 DIAGNOSIS — I639 Cerebral infarction, unspecified: Secondary | ICD-10-CM | POA: Diagnosis not present

## 2015-12-02 NOTE — Progress Notes (Signed)
Carelink Summary Report / Loop Recorder 

## 2015-12-14 LAB — CUP PACEART REMOTE DEVICE CHECK: Date Time Interrogation Session: 20161124160801

## 2015-12-29 ENCOUNTER — Ambulatory Visit (INDEPENDENT_AMBULATORY_CARE_PROVIDER_SITE_OTHER): Payer: Medicare Other | Admitting: *Deleted

## 2015-12-29 DIAGNOSIS — I639 Cerebral infarction, unspecified: Secondary | ICD-10-CM

## 2015-12-30 NOTE — Progress Notes (Signed)
Carelink Summary Report / Loop Recorder 

## 2016-01-10 LAB — CUP PACEART REMOTE DEVICE CHECK: MDC IDC SESS DTM: 20161224160908

## 2016-01-12 DIAGNOSIS — L570 Actinic keratosis: Secondary | ICD-10-CM | POA: Diagnosis not present

## 2016-01-12 DIAGNOSIS — L821 Other seborrheic keratosis: Secondary | ICD-10-CM | POA: Diagnosis not present

## 2016-01-12 DIAGNOSIS — Z85820 Personal history of malignant melanoma of skin: Secondary | ICD-10-CM | POA: Diagnosis not present

## 2016-01-28 ENCOUNTER — Ambulatory Visit (INDEPENDENT_AMBULATORY_CARE_PROVIDER_SITE_OTHER): Payer: Medicare Other | Admitting: *Deleted

## 2016-01-28 DIAGNOSIS — I639 Cerebral infarction, unspecified: Secondary | ICD-10-CM | POA: Diagnosis not present

## 2016-01-28 NOTE — Progress Notes (Signed)
Carelink Summary Report / Loop Recorder 

## 2016-02-09 LAB — CUP PACEART REMOTE DEVICE CHECK: Date Time Interrogation Session: 20170123163558

## 2016-02-09 NOTE — Progress Notes (Signed)
Carelink summary report received. Battery status OK. Normal device function. No new symptom episodes, tachy episodes, brady, or pause episodes. No new AF episodes. Monthly summary reports and ROV/PRN 

## 2016-02-16 LAB — CUP PACEART REMOTE DEVICE CHECK: Date Time Interrogation Session: 20170222163635

## 2016-02-16 NOTE — Progress Notes (Signed)
Carelink summary report received. Battery status OK. Normal device function. No new symptom episodes, tachy episodes, brady, or pause episodes. No new AF episodes. Monthly summary reports and ROV/PRN 

## 2016-02-27 ENCOUNTER — Ambulatory Visit (INDEPENDENT_AMBULATORY_CARE_PROVIDER_SITE_OTHER): Payer: Medicare Other | Admitting: *Deleted

## 2016-02-27 DIAGNOSIS — I639 Cerebral infarction, unspecified: Secondary | ICD-10-CM | POA: Diagnosis not present

## 2016-02-27 NOTE — Progress Notes (Signed)
Carelink Summary Report / Loop Recorder 

## 2016-03-29 ENCOUNTER — Ambulatory Visit (INDEPENDENT_AMBULATORY_CARE_PROVIDER_SITE_OTHER): Payer: Medicare Other | Admitting: *Deleted

## 2016-03-29 DIAGNOSIS — I639 Cerebral infarction, unspecified: Secondary | ICD-10-CM

## 2016-03-29 NOTE — Progress Notes (Signed)
Carelink Summary Report / Loop Recorder 

## 2016-04-27 ENCOUNTER — Ambulatory Visit (INDEPENDENT_AMBULATORY_CARE_PROVIDER_SITE_OTHER): Payer: Medicare Other | Admitting: *Deleted

## 2016-04-27 DIAGNOSIS — I639 Cerebral infarction, unspecified: Secondary | ICD-10-CM

## 2016-04-28 NOTE — Progress Notes (Signed)
Carelink Summary Report / Loop Recorder 

## 2016-05-01 LAB — CUP PACEART REMOTE DEVICE CHECK: MDC IDC SESS DTM: 20170324170625

## 2016-05-01 NOTE — Progress Notes (Signed)
Carelink summary report received. Battery status OK. Normal device function. No new symptom episodes, tachy episodes, brady, or pause episodes. No new AF episodes. Monthly summary reports and ROV/PRN 

## 2016-05-03 LAB — CUP PACEART REMOTE DEVICE CHECK: MDC IDC SESS DTM: 20170423173719

## 2016-05-03 NOTE — Progress Notes (Signed)
Carelink summary report received. Battery status OK. Normal device function. No new symptom episodes, tachy episodes, brady, or pause episodes. No new AF episodes. Monthly summary reports and ROV/PRN 

## 2016-05-20 NOTE — Progress Notes (Signed)
Cardiology Office Note   Date:  05/21/2016   ID:  James Tate, DOB 1946/02/03, MRN [id number]  PCP:  Simona Huh, MD  Cardiologist:   Jenkins Rouge, MD   Chief Complaint  Patient presents with  . cryptogenic stroke    no sx      History of Present Illness: James Tate was admitted to a hospital with acute CVA in [place] 4/16. He reports that while on vacation, he has acute R facial numbness with facial droop and slurred speech. He had MRI/ dopplers/ telemetry which were unrevealing for the cause of his stroke. He was placed on ASA and atorvastatin. He had full recovery and returned home.  TEE was performed. This revealed cor triatriatum and PFO. No real source for his stroke was found.He was monitored on telemetry which demonstrated no arrhythmias. No cause has been identified. Seen by Dr Burt Knack who did not feel like PFO should be closed and favored initial Rx with ASA and statin.  Had ILR placed by Dr Rayann Heman  07/02/15 and to date no PAF identified  Thinks he is having some muscle pain from statin Better now but was worse in his legs Over winter    Past Medical History  Diagnosis Date  . Osteopenia     resolved with medical therapy (calcium and fosamax)  . 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 BPH (benign prostatic hyperplasia)   . Flat feet   . Wears hearing aid     both  . PFO (patent foramen ovale)   . Cor triatriatum   . Stroke [facility]) 4/16    Past Surgical History  Procedure Laterality Date  . Tonsillectomy    . Cholecystectomy  1996  . Lasik  2003  . Parathyroidectomy  2004  . Melanoma excision N/A 10/04/2014    Procedure: WIDE LOCAL EXCISION OF UPPER BACK MELANOMA ;  Surgeon: Gayland Curry, MD;  [facility]: [facility];  Service: General;  Laterality: N/A;  . Application of a-cell of back N/A 12/19/2014    Procedure: APPLICATION OF A-CELL OF BACK;  Surgeon: Theodoro Kos, DO;  Location: [facility];  Service: Plastics;  Laterality:  N/A;  . Tee without cardioversion N/A 06/12/2015    Procedure: TRANSESOPHAGEAL ECHOCARDIOGRAM (TEE);  Surgeon: Josue Hector, MD;  Location: [facility] [place];  Service: Cardiovascular;  Laterality: N/A;  . Ep implantable device N/A 07/02/2015    Procedure: Loop Recorder Insertion;  Surgeon: Thompson Grayer, MD;  Location: [place] [facility];  Service: Cardiovascular;  Laterality: N/A;     Current Outpatient Prescriptions  Medication Sig Dispense Refill  . Acetaminophen (TYLENOL PO) Take 1-2 tablets by mouth daily as needed (for pain).    Marland Kitchen aspirin 325 MG tablet Take 325 mg by mouth every 6 (six) hours as needed for mild pain or moderate pain.    Marland Kitchen aspirin 81 MG tablet Take 81 mg by mouth daily.    Marland Kitchen atorvastatin (LIPITOR) 20 MG tablet Take 1 tablet (20 mg total) by mouth daily at 6 PM. 90 tablet 3  . CALCIUM PO Take 1 tablet by mouth every morning.    . Cholecalciferol (VITAMIN D PO) Take 1 tablet by mouth every morning.    . Cyanocobalamin (VITAMIN B-12 PO) Take 1 tablet by mouth every morning.    . Multiple Vitamin (MULTIVITAMIN) capsule Take 1 capsule by mouth daily.    . Omega-3 Fatty Acids (OMEGA 3 PO) Take 1 tablet by  mouth every morning.    . tadalafil (CIALIS) 20 MG tablet Take 20 mg by mouth daily as needed for erectile dysfunction.    . terbinafine (LAMISIL) 250 MG tablet Crush 2 tablets in fungi-nail solution and apply once daily to affected toenails    . triamcinolone (KENALOG) 0.1 % paste Use as directed 1 application in the mouth or throat daily as needed (mouth ulcers).     . Vitamin K, Phytonadione, 100 MCG TABS Take 100 mcg by mouth daily.      No current facility-administered medications for this visit.    Allergies:   Codeine and Erythromycin    Social History:  The patient  reports that he quit smoking about 44 years ago. He does not have any smokeless tobacco history on file. He reports that he drinks alcohol. He reports that he does not use illicit drugs.   Family  History:  The patient's family history includes Heart Problems in his mother; Osteoporosis in his father; Other in his maternal grandfather.    ROS:  Please see the history of present illness.   Otherwise, review of systems are positive for none.   All other systems are reviewed and negative.    PHYSICAL EXAM: VS:  BP 110/66 mmHg  Pulse 62  Ht 6' (1.829 m)  Wt 187 lb 12.8 oz (85.186 kg)  BMI 25.46 kg/m2  SpO2 96% , BMI Body mass index is 25.46 kg/(m^2). Affect appropriate Healthy:  appears stated age 70: normal Neck supple with no adenopathy JVP normal no bruits no thyromegaly Lungs clear with no wheezing and good diaphragmatic motion Heart:  S1/S2 no murmur, no rub, gallop or click PMI normal Abdomen: benighn, BS positve, no tenderness, no AAA no bruit.  No HSM or HJR Distal pulses intact with no bruits No edema Neuro non-focal Skin warm and dry No muscular weakness    EKG:   05/26/15 SR rate 68 normal ECG  05/21/16  NSR rate 63  Normal ECG    Recent Labs: No results found for requested labs within last 365 days.    Lipid Panel No results found for: CHOL, TRIG, HDL, CHOLHDL, VLDL, LDLCALC, LDLDIRECT    Wt Readings from Last 3 Encounters:  05/21/16 187 lb 12.8 oz (85.186 kg)  09/18/15 187 lb 12.8 oz (85.186 kg)  07/02/15 187 lb (84.823 kg)      Other studies Reviewed: Additional studies/ records that were reviewed today include: TEE, EP notes note Dr Burt Knack .    ASSESSMENT AND PLAN:  1.  CVA: ASA no carotid DX  ILR with no PAF  Stable consider PFO closure For 2nd recurrence if happens  2. PFO  Seen by Dr Burt Knack no indication for closure at this time 3. Chol:   Has not had labs with [facility] recently will check lipids, liver And cpk next week depending on LDL can lower dose or try different med   Current medicines are reviewed at length with the patient today.  The patient does not have concerns regarding medicines.  The following changes have been  made:  no change  Labs/ tests ordered today include: None   No orders of the defined types were placed in this encounter.     Disposition:   FU with me in a year      Signed, Jenkins Rouge, MD  05/21/2016 9:16 AM    [facility] [place], [place], [place]  [address] Phone: [phone]; Fax: [phone]

## 2016-05-21 ENCOUNTER — Ambulatory Visit (INDEPENDENT_AMBULATORY_CARE_PROVIDER_SITE_OTHER): Payer: Medicare Other | Admitting: Cardiovascular Disease

## 2016-05-21 ENCOUNTER — Encounter: Payer: Self-pay | Admitting: Cardiovascular Disease

## 2016-05-21 VITALS — BP 110/66 | HR 62 | Ht 72.0 in | Wt 187.8 lb

## 2016-05-21 DIAGNOSIS — I639 Cerebral infarction, unspecified: Secondary | ICD-10-CM | POA: Diagnosis not present

## 2016-05-21 NOTE — Patient Instructions (Addendum)
Medication Instructions:   Your physician recommends that you continue on your current medications as directed. Please refer to the Current Medication list given to you today.   If you need a refill on your cardiac medications before your next appointment, please call your pharmacy.  Labwork: RETURN FOR FASTING  LIPIDS CPK AND LFT NEXT WEEK    Testing/Procedures:  NONE ORDER TODAY    Follow-Up:  Your physician wants you to follow-up in: [place] will receive a reminder letter in the mail two months in advance. If you don't receive a letter, please call our office to schedule the follow-up appointment.     Any Other Special Instructions Will Be Listed Below (If Applicable).

## 2016-05-24 ENCOUNTER — Other Ambulatory Visit (INDEPENDENT_AMBULATORY_CARE_PROVIDER_SITE_OTHER): Payer: Medicare Other | Admitting: *Deleted

## 2016-05-24 DIAGNOSIS — I6350 Cerebral infarction due to unspecified occlusion or stenosis of unspecified cerebral artery: Secondary | ICD-10-CM | POA: Diagnosis not present

## 2016-05-24 DIAGNOSIS — I639 Cerebral infarction, unspecified: Secondary | ICD-10-CM

## 2016-05-24 LAB — LIPID PANEL
Cholesterol: 118 mg/dL — ABNORMAL LOW (ref 125–200)
HDL: 51 mg/dL (ref >=40)
LDL CALC: 49 mg/dL (ref <130)
TRIGLYCERIDES: 88 mg/dL (ref <150)
Total CHOL/HDL Ratio: 2.3 Ratio (ref <=5.0)
VLDL: 18 mg/dL (ref <30)

## 2016-05-24 LAB — HEPATIC FUNCTION PANEL
ALK PHOS: 55 U/L (ref 40–115)
ALT: 22 U/L (ref 9–46)
AST: 26 U/L (ref 10–35)
Albumin: 4.2 g/dL (ref 3.6–5.1)
BILIRUBIN DIRECT: 0.3 mg/dL — AB (ref <=0.2)
BILIRUBIN INDIRECT: 1.2 mg/dL (ref 0.2–1.2)
Total Bilirubin: 1.5 mg/dL — ABNORMAL HIGH (ref 0.2–1.2)
Total Protein: 7 g/dL (ref 6.1–8.1)

## 2016-05-24 LAB — CK: CK TOTAL: 202 U/L (ref 7–232)

## 2016-05-24 NOTE — Addendum Note (Signed)
Addended by: Eulis Foster on: 05/24/2016 10:31 AM   Modules accepted: Orders

## 2016-05-27 ENCOUNTER — Ambulatory Visit (INDEPENDENT_AMBULATORY_CARE_PROVIDER_SITE_OTHER): Payer: Medicare Other | Admitting: *Deleted

## 2016-05-27 DIAGNOSIS — I639 Cerebral infarction, unspecified: Secondary | ICD-10-CM

## 2016-05-27 NOTE — Progress Notes (Signed)
Carelink Summary Report / Loop Recorder 

## 2016-05-29 ENCOUNTER — Other Ambulatory Visit: Payer: Self-pay | Admitting: Cardiovascular Disease

## 2016-06-03 LAB — CUP PACEART REMOTE DEVICE CHECK: Date Time Interrogation Session: 20170523173911

## 2016-06-18 LAB — CUP PACEART REMOTE DEVICE CHECK: MDC IDC SESS DTM: 20170622173818

## 2016-06-28 ENCOUNTER — Ambulatory Visit (INDEPENDENT_AMBULATORY_CARE_PROVIDER_SITE_OTHER): Payer: Medicare Other | Admitting: *Deleted

## 2016-06-28 DIAGNOSIS — I639 Cerebral infarction, unspecified: Secondary | ICD-10-CM

## 2016-06-28 NOTE — Progress Notes (Signed)
Carelink Summary Report / Loop Recorder 

## 2016-07-24 LAB — CUP PACEART REMOTE DEVICE CHECK: Date Time Interrogation Session: 20170722183627

## 2016-07-24 NOTE — Progress Notes (Signed)
Carelink summary report received. Battery status OK. Normal device function. No new symptom episodes, tachy episodes, brady, or pause episodes. No new AF episodes. Monthly summary reports and ROV/PRN 

## 2016-07-26 ENCOUNTER — Ambulatory Visit (INDEPENDENT_AMBULATORY_CARE_PROVIDER_SITE_OTHER): Payer: Medicare Other | Admitting: *Deleted

## 2016-07-26 DIAGNOSIS — I639 Cerebral infarction, unspecified: Secondary | ICD-10-CM | POA: Diagnosis not present

## 2016-07-26 DIAGNOSIS — R972 Elevated prostate specific antigen [PSA]: Secondary | ICD-10-CM | POA: Diagnosis not present

## 2016-07-26 NOTE — Progress Notes (Signed)
Carelink Summary Report / Loop Recorder 

## 2016-07-28 DIAGNOSIS — L84 Corns and callosities: Secondary | ICD-10-CM | POA: Diagnosis not present

## 2016-07-28 DIAGNOSIS — L821 Other seborrheic keratosis: Secondary | ICD-10-CM | POA: Diagnosis not present

## 2016-07-28 DIAGNOSIS — D225 Melanocytic nevi of trunk: Secondary | ICD-10-CM | POA: Diagnosis not present

## 2016-07-28 DIAGNOSIS — Z85820 Personal history of malignant melanoma of skin: Secondary | ICD-10-CM | POA: Diagnosis not present

## 2016-08-02 DIAGNOSIS — R972 Elevated prostate specific antigen [PSA]: Secondary | ICD-10-CM | POA: Diagnosis not present

## 2016-08-02 DIAGNOSIS — N400 Enlarged prostate without lower urinary tract symptoms: Secondary | ICD-10-CM | POA: Diagnosis not present

## 2016-08-23 LAB — CUP PACEART REMOTE DEVICE CHECK: MDC IDC SESS DTM: 20170821190811

## 2016-08-23 NOTE — Progress Notes (Signed)
Carelink summary report received. Battery status OK. Normal device function. Fair histogram. No new symptom episodes, tachy episodes, brady, or pause episodes. No new AF episodes. Monthly summary reports and ROV/PRN

## 2016-08-25 ENCOUNTER — Ambulatory Visit (INDEPENDENT_AMBULATORY_CARE_PROVIDER_SITE_OTHER): Payer: Medicare Other | Admitting: *Deleted

## 2016-08-25 DIAGNOSIS — I639 Cerebral infarction, unspecified: Secondary | ICD-10-CM | POA: Diagnosis not present

## 2016-08-26 NOTE — Progress Notes (Signed)
Carelink Summary Report / Loop Recorder 

## 2016-09-07 DIAGNOSIS — M25562 Pain in left knee: Secondary | ICD-10-CM | POA: Diagnosis not present

## 2016-09-18 LAB — CUP PACEART REMOTE DEVICE CHECK: MDC IDC SESS DTM: 20170920193630

## 2016-09-18 NOTE — Progress Notes (Signed)
Carelink summary report received. Battery status OK. Normal device function. No new symptom episodes, tachy episodes, brady, or pause episodes. No new AF episodes. Monthly summary reports and ROV/PRN 

## 2016-09-24 ENCOUNTER — Ambulatory Visit (INDEPENDENT_AMBULATORY_CARE_PROVIDER_SITE_OTHER): Payer: Medicare Other | Admitting: *Deleted

## 2016-09-24 DIAGNOSIS — I639 Cerebral infarction, unspecified: Secondary | ICD-10-CM

## 2016-09-27 NOTE — Progress Notes (Signed)
Carelink Summary Report / Loop Recorder 

## 2016-10-14 DIAGNOSIS — M1712 Unilateral primary osteoarthritis, left knee: Secondary | ICD-10-CM | POA: Diagnosis not present

## 2016-10-21 DIAGNOSIS — M1712 Unilateral primary osteoarthritis, left knee: Secondary | ICD-10-CM | POA: Diagnosis not present

## 2016-10-23 LAB — CUP PACEART REMOTE DEVICE CHECK
Date Time Interrogation Session: 20171020210646
MDC IDC PG IMPLANT DT: 20160727

## 2016-10-23 NOTE — Progress Notes (Signed)
Carelink summary report received. Battery status OK. Normal device function. No new symptom episodes, tachy episodes, brady, or pause episodes. No new AF episodes. Monthly summary reports and ROV/PRN 

## 2016-10-25 ENCOUNTER — Ambulatory Visit (INDEPENDENT_AMBULATORY_CARE_PROVIDER_SITE_OTHER): Payer: Medicare Other | Admitting: *Deleted

## 2016-10-25 DIAGNOSIS — I639 Cerebral infarction, unspecified: Secondary | ICD-10-CM | POA: Diagnosis not present

## 2016-10-25 NOTE — Progress Notes (Signed)
Carelink Summary Report / Loop Recorder 

## 2016-11-02 DIAGNOSIS — Z79899 Other long term (current) drug therapy: Secondary | ICD-10-CM | POA: Diagnosis not present

## 2016-11-02 DIAGNOSIS — Z23 Encounter for immunization: Secondary | ICD-10-CM | POA: Diagnosis not present

## 2016-11-02 DIAGNOSIS — C4359 Malignant melanoma of other part of trunk: Secondary | ICD-10-CM | POA: Diagnosis not present

## 2016-11-02 DIAGNOSIS — I639 Cerebral infarction, unspecified: Secondary | ICD-10-CM | POA: Diagnosis not present

## 2016-11-02 DIAGNOSIS — E7800 Pure hypercholesterolemia, unspecified: Secondary | ICD-10-CM | POA: Diagnosis not present

## 2016-11-02 DIAGNOSIS — M8580 Other specified disorders of bone density and structure, unspecified site: Secondary | ICD-10-CM | POA: Diagnosis not present

## 2016-11-02 DIAGNOSIS — Z1389 Encounter for screening for other disorder: Secondary | ICD-10-CM | POA: Diagnosis not present

## 2016-11-02 DIAGNOSIS — Z0000 Encounter for general adult medical examination without abnormal findings: Secondary | ICD-10-CM | POA: Diagnosis not present

## 2016-11-04 DIAGNOSIS — M1712 Unilateral primary osteoarthritis, left knee: Secondary | ICD-10-CM | POA: Diagnosis not present

## 2016-11-11 DIAGNOSIS — M1712 Unilateral primary osteoarthritis, left knee: Secondary | ICD-10-CM | POA: Diagnosis not present

## 2016-11-18 DIAGNOSIS — M1712 Unilateral primary osteoarthritis, left knee: Secondary | ICD-10-CM | POA: Diagnosis not present

## 2016-11-23 ENCOUNTER — Ambulatory Visit (INDEPENDENT_AMBULATORY_CARE_PROVIDER_SITE_OTHER): Payer: Medicare Other | Admitting: *Deleted

## 2016-11-23 DIAGNOSIS — I639 Cerebral infarction, unspecified: Secondary | ICD-10-CM | POA: Diagnosis not present

## 2016-11-24 NOTE — Progress Notes (Signed)
Carelink Summary Report / Loop Recorder 

## 2016-12-07 LAB — CUP PACEART REMOTE DEVICE CHECK
MDC IDC PG IMPLANT DT: 20160727
MDC IDC SESS DTM: 20171119221157

## 2016-12-23 ENCOUNTER — Ambulatory Visit (INDEPENDENT_AMBULATORY_CARE_PROVIDER_SITE_OTHER): Payer: Medicare Other | Admitting: *Deleted

## 2016-12-23 DIAGNOSIS — I639 Cerebral infarction, unspecified: Secondary | ICD-10-CM

## 2016-12-24 NOTE — Progress Notes (Signed)
Carelink Summary Report / Loop Recorder 

## 2016-12-27 DIAGNOSIS — Z8673 Personal history of transient ischemic attack (TIA), 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 residual deficits: Secondary | ICD-10-CM | POA: Diagnosis not present

## 2016-12-27 DIAGNOSIS — Z85820 Personal history of malignant melanoma of skin: Secondary | ICD-10-CM | POA: Diagnosis not present

## 2016-12-27 DIAGNOSIS — S21201D Unspecified open wound of right back wall of thorax without penetration into thoracic cavity, subsequent encounter: Secondary | ICD-10-CM | POA: Diagnosis not present

## 2017-01-10 DIAGNOSIS — R972 Elevated prostate specific antigen [PSA]: Secondary | ICD-10-CM | POA: Diagnosis not present

## 2017-01-11 LAB — CUP PACEART REMOTE DEVICE CHECK
Implantable Pulse Generator Implant Date: 20160727
MDC IDC SESS DTM: 20171219230615

## 2017-01-19 DIAGNOSIS — N5201 Erectile dysfunction due to arterial insufficiency: Secondary | ICD-10-CM | POA: Diagnosis not present

## 2017-01-19 DIAGNOSIS — R972 Elevated prostate specific antigen [PSA]: Secondary | ICD-10-CM | POA: Diagnosis not present

## 2017-01-19 DIAGNOSIS — N400 Enlarged prostate without lower urinary tract symptoms: Secondary | ICD-10-CM | POA: Diagnosis not present

## 2017-01-19 DIAGNOSIS — R351 Nocturia: Secondary | ICD-10-CM | POA: Diagnosis not present

## 2017-01-24 ENCOUNTER — Ambulatory Visit (INDEPENDENT_AMBULATORY_CARE_PROVIDER_SITE_OTHER): Payer: Medicare Other | Admitting: *Deleted

## 2017-01-24 DIAGNOSIS — I639 Cerebral infarction, unspecified: Secondary | ICD-10-CM | POA: Diagnosis not present

## 2017-01-25 NOTE — Progress Notes (Signed)
Carelink Summary Report / Loop Recorder 

## 2017-01-26 DIAGNOSIS — L814 Other melanin hyperpigmentation: Secondary | ICD-10-CM | POA: Diagnosis not present

## 2017-01-26 DIAGNOSIS — L821 Other seborrheic keratosis: Secondary | ICD-10-CM | POA: Diagnosis not present

## 2017-01-26 DIAGNOSIS — D225 Melanocytic nevi of trunk: Secondary | ICD-10-CM | POA: Diagnosis not present

## 2017-01-26 DIAGNOSIS — Z85820 Personal history of malignant melanoma of skin: Secondary | ICD-10-CM | POA: Diagnosis not present

## 2017-01-26 DIAGNOSIS — L570 Actinic keratosis: Secondary | ICD-10-CM | POA: Diagnosis not present

## 2017-01-27 LAB — CUP PACEART REMOTE DEVICE CHECK
Implantable Pulse Generator Implant Date: 20160727
MDC IDC SESS DTM: 20180118233559

## 2017-02-15 LAB — CUP PACEART REMOTE DEVICE CHECK
Implantable Pulse Generator Implant Date: 20160727
MDC IDC SESS DTM: 20180217234837

## 2017-02-21 ENCOUNTER — Ambulatory Visit (INDEPENDENT_AMBULATORY_CARE_PROVIDER_SITE_OTHER): Payer: Medicare Other | Admitting: *Deleted

## 2017-02-21 DIAGNOSIS — I639 Cerebral infarction, unspecified: Secondary | ICD-10-CM

## 2017-02-22 NOTE — Progress Notes (Signed)
Carelink Summary Report / Loop Recorder 

## 2017-03-06 LAB — CUP PACEART REMOTE DEVICE CHECK
Date Time Interrogation Session: 20180320003534
MDC IDC PG IMPLANT DT: 20160727

## 2017-03-06 NOTE — Progress Notes (Signed)
Carelink summary report received. Battery status OK. Normal device function. No new symptom episodes, tachy episodes, brady, or pause episodes. No new AF episodes. Monthly summary reports and ROV/PRN 

## 2017-03-23 ENCOUNTER — Ambulatory Visit (INDEPENDENT_AMBULATORY_CARE_PROVIDER_SITE_OTHER): Payer: Medicare Other | Admitting: *Deleted

## 2017-03-23 DIAGNOSIS — I639 Cerebral infarction, unspecified: Secondary | ICD-10-CM

## 2017-03-24 NOTE — Progress Notes (Signed)
Carelink Summary Report / Loop Recorder 

## 2017-04-02 LAB — CUP PACEART REMOTE DEVICE CHECK
Implantable Pulse Generator Implant Date: 20160727
MDC IDC SESS DTM: 20180419013539

## 2017-04-22 ENCOUNTER — Ambulatory Visit (INDEPENDENT_AMBULATORY_CARE_PROVIDER_SITE_OTHER): Payer: Medicare Other | Admitting: *Deleted

## 2017-04-22 DIAGNOSIS — I639 Cerebral infarction, unspecified: Secondary | ICD-10-CM

## 2017-04-25 NOTE — Progress Notes (Signed)
Carelink Summary Report / Loop Recorder 

## 2017-04-28 ENCOUNTER — Encounter: Payer: Self-pay | Admitting: Cardiovascular Disease

## 2017-05-02 LAB — CUP PACEART REMOTE DEVICE CHECK
Date Time Interrogation Session: 20180519020858
MDC IDC PG IMPLANT DT: 20160727

## 2017-05-13 NOTE — Progress Notes (Signed)
Cardiology Office Note   Date:  05/17/2017   ID:  BABYBOY LOYA, DOB [birth year], MRN [id number]  PCP:  Gaynelle Arabian, MD  Cardiologist:   Jenkins Rouge, MD   No chief complaint on file.     History of Present Illness: UNKNOWN SCHLEYER was admitted to a hospital with acute CVA in [place] 4/16. He reports that while on vacation, he has acute R facial numbness with facial droop and slurred speech. He had MRI/ dopplers/ telemetry which were unrevealing for the cause of his stroke. He was placed on ASA and atorvastatin. He had full recovery and returned home.  TEE was performed. This revealed cor triatriatum and PFO. No real source for his stroke was found.He was monitored on telemetry which demonstrated no arrhythmias. No cause has been identified. Seen by Dr Burt Knack who did not feel like PFO should be closed and favored initial Rx with ASA and statin.  Had ILR placed by Dr Rayann Heman  07/02/15 and to date no PAF identified  Thinks he is having some muscle pain from statin    PSA has been as high as 5 sees [facility] last biopsy 2 years ago  Dr Henrietta Dine checks labs/cholesterol  Past Medical History:  Diagnosis Date  . BPH (benign prostatic hyperplasia)   . Cor triatriatum   . Flat feet   . 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 Osteopenia    resolved with medical therapy (calcium and fosamax)  . PFO (patent foramen ovale)   . Stroke ([place]) 4/16  . Wears hearing aid    both    Past Surgical History:  Procedure Laterality Date  . APPLICATION OF A-CELL OF BACK N/A 12/19/2014   Procedure: APPLICATION OF A-CELL OF BACK;  Surgeon: Theodoro Kos, DO;  Location: [facility];  Service: Plastics;  Laterality: N/A;  . CHOLECYSTECTOMY  1996  . EP IMPLANTABLE DEVICE N/A 07/02/2015   Procedure: Loop Recorder Insertion;  Surgeon: Thompson Grayer, MD;  Location: [facility];  Service: Cardiovascular;  Laterality: N/A;  . LASIK  2003  . MELANOMA EXCISION N/A 10/04/2014     Procedure: WIDE LOCAL EXCISION OF UPPER BACK MELANOMA ;  Surgeon: Gayland Curry, MD;  [facility];  Service: General;  Laterality: N/A;  . PARATHYROIDECTOMY  2004  . TEE WITHOUT CARDIOVERSION N/A 06/12/2015   Procedure: TRANSESOPHAGEAL ECHOCARDIOGRAM (TEE);  Surgeon: Josue Hector, MD;  Location: [facility];  Service: Cardiovascular;  Laterality: N/A;  . TONSILLECTOMY       Current Outpatient Prescriptions  Medication Sig Dispense Refill  . Acetaminophen (TYLENOL PO) Take 1-2 tablets by mouth daily as needed (for pain).    Marland Kitchen aspirin 325 MG tablet Take 325 mg by mouth every 6 (six) hours as needed for mild pain or moderate pain.    Marland Kitchen aspirin 81 MG tablet Take 81 mg by mouth daily.    Marland Kitchen atorvastatin (LIPITOR) 20 MG tablet Take 1 tablet (20 mg total) by mouth daily at 6 PM. 90 tablet 3  . CALCIUM PO Take 1 tablet by mouth every morning.    . Cholecalciferol (VITAMIN D PO) Take 1 tablet by mouth every morning.    . Cyanocobalamin (VITAMIN B-12 PO) Take 1 tablet by mouth every morning.    . Multiple Vitamin (MULTIVITAMIN) capsule Take 1 capsule by mouth daily.    . Omega-3 Fatty Acids (OMEGA 3 PO) Take 1 tablet by mouth every morning.    Marland Kitchen  tadalafil (CIALIS) 20 MG tablet Take 20 mg by mouth daily as needed for erectile dysfunction.    . terbinafine (LAMISIL) 250 MG tablet Crush 2 tablets in fungi-nail solution and apply once daily to affected toenails    . Vitamin K, Phytonadione, 100 MCG TABS Take 100 mcg by mouth daily.      No current facility-administered medications for this visit.     Allergies:   Codeine and Erythromycin    Social History:  The patient  reports that he quit smoking about 45 years ago. He has never used smokeless tobacco. He reports that he drinks alcohol. He reports that he does not use drugs.   Family History:  The patient's family history includes Heart Problems in his mother; Osteoporosis in his father; Other in his maternal grandfather.    ROS:   Please see the history of present illness.   Otherwise, review of systems are positive for none.   All other systems are reviewed and negative.    PHYSICAL EXAM: VS:  BP 114/66 (BP Location: Right Arm)   Pulse 61   Ht 6' (1.829 m)   Wt 84.1 kg (185 lb 6.4 oz)   BMI 25.14 kg/m  , BMI Body mass index is 25.14 kg/m. Affect appropriate Healthy:  appears stated age 71: normal Neck supple with no adenopathy JVP normal no bruits no thyromegaly Lungs clear with no wheezing and good diaphragmatic motion Heart:  S1/S2 no murmur, no rub, gallop or click PMI normal Abdomen: benighn, BS positve, no tenderness, no AAA no bruit.  No HSM or HJR Distal pulses intact with no bruits No edema Neuro non-focal Skin warm and dry No muscular weakness     EKG:   05/26/15 SR rate 68 normal ECG  05/21/16  NSR rate 63  Normal ECG  05/17/17  SR rate 61 normal ECG    Recent Labs: 05/24/2016: ALT 22    Lipid Panel    Component Value Date/Time   CHOL 118 (L) 05/24/2016 1032   TRIG 88 05/24/2016 1032   HDL 51 05/24/2016 1032   CHOLHDL 2.3 05/24/2016 1032   VLDL 18 05/24/2016 1032   LDLCALC 49 05/24/2016 1032      Wt Readings from Last 3 Encounters:  05/17/17 84.1 kg (185 lb 6.4 oz)  05/21/16 85.2 kg (187 lb 12.8 oz)  09/18/15 85.2 kg (187 lb 12.8 oz)      Other studies Reviewed: Additional studies/ records that were reviewed today include: TEE, EP notes note Dr Burt Knack .    ASSESSMENT AND PLAN:  1.  CVA: ASA no carotid DX  ILR with no PAF  Stable consider PFO closure For 2nd recurrence if happens F/U echo with bubble study in a year  2. PFO  Seen by Dr Burt Knack no indication for closure at this time 3. Chol:   Continue low dose lipitor  Lab Results  Component Value Date   LDLCALC 49 05/24/2016   4. Prostate gave him Suezanne Jacquet [name]'s name as Dr Risa Grill is doing more administrative work PSA q 6 months no evidence of infection at this time    Current medicines are reviewed at length  with the patient today.  The patient does not have concerns regarding medicines.  The following changes have been made:  no change  Labs/ tests ordered today include: None    Orders Placed This Encounter  Procedures  . EKG 12-Lead  . ECHOCARDIOGRAM COMPLETE BUBBLE STUDY     Disposition:   FU  with me in a year      Signed, Jenkins Rouge, MD  05/17/2017 9:26 AM    [facility] [place], [place], [place]  [address] Phone: [phone]; Fax: [phone]

## 2017-05-17 ENCOUNTER — Encounter: Payer: Self-pay | Admitting: Cardiovascular Disease

## 2017-05-17 ENCOUNTER — Ambulatory Visit (INDEPENDENT_AMBULATORY_CARE_PROVIDER_SITE_OTHER): Payer: Medicare Other | Admitting: Cardiovascular Disease

## 2017-05-17 VITALS — BP 114/66 | HR 61 | Ht 72.0 in | Wt 185.4 lb

## 2017-05-17 DIAGNOSIS — Q211 Atrial septal defect: Secondary | ICD-10-CM | POA: Diagnosis not present

## 2017-05-17 DIAGNOSIS — Q2112 Patent foramen ovale: Secondary | ICD-10-CM

## 2017-05-17 DIAGNOSIS — I639 Cerebral infarction, unspecified: Secondary | ICD-10-CM

## 2017-05-17 NOTE — Patient Instructions (Addendum)
Medication Instructions:  Your physician recommends that you continue on your current medications as directed. Please refer to the Current Medication list given to you today.  Labwork: NONE  Testing/Procedures: Your physician has requested that you have an echocardiogram with bubble study in one year. Echocardiography is a painless test that uses sound waves to create images of your heart. It provides your doctor with information about the size and shape of your heart and how well your heart's chambers and valves are working. This procedure takes approximately one hour. There are no restrictions for this procedure.  Follow-Up: Your physician wants you to follow-up in: 12 months with Dr. Johnsie Cancel. You will receive a reminder letter in the mail two months in advance. If you don't receive a letter, please call our office to schedule the follow-up appointment.   If you need a refill on your cardiac medications before your next appointment, please call your pharmacy.

## 2017-05-19 DIAGNOSIS — S40862A Insect bite (nonvenomous) of left upper arm, initial encounter: Secondary | ICD-10-CM | POA: Diagnosis not present

## 2017-05-19 DIAGNOSIS — W57XXXA Bitten or stung by nonvenomous insect and other nonvenomous arthropods, initial encounter: Secondary | ICD-10-CM | POA: Diagnosis not present

## 2017-05-23 ENCOUNTER — Ambulatory Visit (INDEPENDENT_AMBULATORY_CARE_PROVIDER_SITE_OTHER): Payer: Medicare Other | Admitting: *Deleted

## 2017-05-23 DIAGNOSIS — I639 Cerebral infarction, unspecified: Secondary | ICD-10-CM

## 2017-05-23 NOTE — Progress Notes (Signed)
Carelink Summary Report / Loop Recorder 

## 2017-05-26 ENCOUNTER — Other Ambulatory Visit: Payer: Self-pay | Admitting: Cardiovascular Disease

## 2017-05-31 LAB — CUP PACEART REMOTE DEVICE CHECK
MDC IDC PG IMPLANT DT: 20160727
MDC IDC SESS DTM: 20180618020824

## 2017-06-21 ENCOUNTER — Ambulatory Visit (INDEPENDENT_AMBULATORY_CARE_PROVIDER_SITE_OTHER): Payer: Medicare Other | Admitting: *Deleted

## 2017-06-21 DIAGNOSIS — I639 Cerebral infarction, unspecified: Secondary | ICD-10-CM

## 2017-06-22 NOTE — Progress Notes (Signed)
Carelink Summary Report / Loop Recorder 

## 2017-07-05 LAB — CUP PACEART REMOTE DEVICE CHECK
Date Time Interrogation Session: 20180718024818
MDC IDC PG IMPLANT DT: 20160727

## 2017-07-05 NOTE — Progress Notes (Signed)
Carelink summary report received. Battery status OK. Normal device function. No new symptom episodes, tachy episodes, brady, or pause episodes. No new AF episodes. Monthly summary reports and ROV/PRN 

## 2017-07-21 ENCOUNTER — Ambulatory Visit (INDEPENDENT_AMBULATORY_CARE_PROVIDER_SITE_OTHER): Payer: Medicare Other | Admitting: *Deleted

## 2017-07-21 DIAGNOSIS — I639 Cerebral infarction, unspecified: Secondary | ICD-10-CM | POA: Diagnosis not present

## 2017-07-22 NOTE — Progress Notes (Signed)
Loop recorder summary report 

## 2017-07-27 DIAGNOSIS — L814 Other melanin hyperpigmentation: Secondary | ICD-10-CM | POA: Diagnosis not present

## 2017-07-27 DIAGNOSIS — Z85820 Personal history of malignant melanoma of skin: Secondary | ICD-10-CM | POA: Diagnosis not present

## 2017-07-27 DIAGNOSIS — D225 Melanocytic nevi of trunk: Secondary | ICD-10-CM | POA: Diagnosis not present

## 2017-07-27 DIAGNOSIS — L821 Other seborrheic keratosis: Secondary | ICD-10-CM | POA: Diagnosis not present

## 2017-07-29 LAB — CUP PACEART REMOTE DEVICE CHECK
MDC IDC PG IMPLANT DT: 20160727
MDC IDC SESS DTM: 20180817023819

## 2017-08-22 ENCOUNTER — Ambulatory Visit (INDEPENDENT_AMBULATORY_CARE_PROVIDER_SITE_OTHER): Payer: Medicare Other | Admitting: *Deleted

## 2017-08-22 DIAGNOSIS — I639 Cerebral infarction, unspecified: Secondary | ICD-10-CM | POA: Diagnosis not present

## 2017-08-22 NOTE — Progress Notes (Signed)
Carelink Summary Report / Loop Recorder 

## 2017-08-25 LAB — CUP PACEART REMOTE DEVICE CHECK
Date Time Interrogation Session: 20180916141436
Implantable Pulse Generator Implant Date: 20160727

## 2017-09-06 DIAGNOSIS — M1712 Unilateral primary osteoarthritis, left knee: Secondary | ICD-10-CM | POA: Diagnosis not present

## 2017-09-20 ENCOUNTER — Ambulatory Visit (INDEPENDENT_AMBULATORY_CARE_PROVIDER_SITE_OTHER): Payer: Medicare Other | Admitting: *Deleted

## 2017-09-20 DIAGNOSIS — I639 Cerebral infarction, unspecified: Secondary | ICD-10-CM | POA: Diagnosis not present

## 2017-09-20 NOTE — Progress Notes (Signed)
Carelink Summary Report / Loop Recorder 

## 2017-09-23 LAB — CUP PACEART REMOTE DEVICE CHECK
MDC IDC PG IMPLANT DT: 20160727
MDC IDC SESS DTM: 20181016160945

## 2017-10-07 DIAGNOSIS — Z23 Encounter for immunization: Secondary | ICD-10-CM | POA: Diagnosis not present

## 2017-10-20 ENCOUNTER — Ambulatory Visit (INDEPENDENT_AMBULATORY_CARE_PROVIDER_SITE_OTHER): Payer: Medicare Other | Admitting: *Deleted

## 2017-10-20 DIAGNOSIS — I639 Cerebral infarction, unspecified: Secondary | ICD-10-CM | POA: Diagnosis not present

## 2017-10-21 NOTE — Progress Notes (Signed)
Carelink Summary Report / Loop Recorder 

## 2017-11-07 LAB — CUP PACEART REMOTE DEVICE CHECK
Implantable Pulse Generator Implant Date: 20160727
MDC IDC SESS DTM: 20181115171029

## 2017-11-21 ENCOUNTER — Ambulatory Visit (INDEPENDENT_AMBULATORY_CARE_PROVIDER_SITE_OTHER): Payer: Medicare Other | Admitting: *Deleted

## 2017-11-21 DIAGNOSIS — I639 Cerebral infarction, unspecified: Secondary | ICD-10-CM | POA: Diagnosis not present

## 2017-11-21 NOTE — Progress Notes (Signed)
Carelink Summary Report / Loop Recorder 

## 2017-12-07 LAB — CUP PACEART REMOTE DEVICE CHECK
Date Time Interrogation Session: 20181215174020
Implantable Pulse Generator Implant Date: 20160727

## 2017-12-19 ENCOUNTER — Ambulatory Visit (INDEPENDENT_AMBULATORY_CARE_PROVIDER_SITE_OTHER): Payer: Medicare Other | Admitting: *Deleted

## 2017-12-19 DIAGNOSIS — I639 Cerebral infarction, unspecified: Secondary | ICD-10-CM | POA: Diagnosis not present

## 2017-12-21 NOTE — Progress Notes (Signed)
Carelink Summary Report / Loop Recorder 

## 2017-12-27 LAB — CUP PACEART REMOTE DEVICE CHECK
Date Time Interrogation Session: 20190114194401
MDC IDC PG IMPLANT DT: 20160727

## 2018-01-03 DIAGNOSIS — M1712 Unilateral primary osteoarthritis, left knee: Secondary | ICD-10-CM | POA: Diagnosis not present

## 2018-01-10 DIAGNOSIS — M1712 Unilateral primary osteoarthritis, left knee: Secondary | ICD-10-CM | POA: Diagnosis not present

## 2018-01-10 DIAGNOSIS — M25562 Pain in left knee: Secondary | ICD-10-CM | POA: Diagnosis not present

## 2018-01-16 DIAGNOSIS — R972 Elevated prostate specific antigen [PSA]: Secondary | ICD-10-CM | POA: Diagnosis not present

## 2018-01-17 DIAGNOSIS — M1712 Unilateral primary osteoarthritis, left knee: Secondary | ICD-10-CM | POA: Diagnosis not present

## 2018-01-17 DIAGNOSIS — M25562 Pain in left knee: Secondary | ICD-10-CM | POA: Diagnosis not present

## 2018-01-18 ENCOUNTER — Ambulatory Visit (INDEPENDENT_AMBULATORY_CARE_PROVIDER_SITE_OTHER): Payer: Medicare Other | Admitting: *Deleted

## 2018-01-18 DIAGNOSIS — I639 Cerebral infarction, unspecified: Secondary | ICD-10-CM | POA: Diagnosis not present

## 2018-01-19 NOTE — Progress Notes (Signed)
Carelink Summary Report / Loop Recorder 

## 2018-01-25 DIAGNOSIS — Z136 Encounter for screening for cardiovascular disorders: Secondary | ICD-10-CM | POA: Diagnosis not present

## 2018-01-25 DIAGNOSIS — M791 Myalgia, unspecified site: Secondary | ICD-10-CM | POA: Diagnosis not present

## 2018-01-25 DIAGNOSIS — Z8673 Personal history of transient ischemic attack (TIA), 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 residual deficits: Secondary | ICD-10-CM | POA: Diagnosis not present

## 2018-01-25 DIAGNOSIS — Z0000 Encounter for general adult medical examination without abnormal findings: Secondary | ICD-10-CM | POA: Diagnosis not present

## 2018-01-25 DIAGNOSIS — E7800 Pure hypercholesterolemia, unspecified: Secondary | ICD-10-CM | POA: Diagnosis not present

## 2018-01-25 DIAGNOSIS — Z1159 Encounter for screening for other viral diseases: Secondary | ICD-10-CM | POA: Diagnosis not present

## 2018-01-25 DIAGNOSIS — Z85820 Personal history of malignant melanoma of skin: Secondary | ICD-10-CM | POA: Diagnosis not present

## 2018-01-25 DIAGNOSIS — B351 Tinea unguium: Secondary | ICD-10-CM | POA: Diagnosis not present

## 2018-01-25 DIAGNOSIS — M8580 Other specified disorders of bone density and structure, unspecified site: Secondary | ICD-10-CM | POA: Diagnosis not present

## 2018-01-25 DIAGNOSIS — R413 Other amnesia: Secondary | ICD-10-CM | POA: Diagnosis not present

## 2018-01-25 DIAGNOSIS — G4710 Hypersomnia, unspecified: Secondary | ICD-10-CM | POA: Diagnosis not present

## 2018-01-25 DIAGNOSIS — Z79899 Other long term (current) drug therapy: Secondary | ICD-10-CM | POA: Diagnosis not present

## 2018-01-26 DIAGNOSIS — L814 Other melanin hyperpigmentation: Secondary | ICD-10-CM | POA: Diagnosis not present

## 2018-01-26 DIAGNOSIS — Z85820 Personal history of malignant melanoma of skin: Secondary | ICD-10-CM | POA: Diagnosis not present

## 2018-01-26 DIAGNOSIS — L821 Other seborrheic keratosis: Secondary | ICD-10-CM | POA: Diagnosis not present

## 2018-01-26 DIAGNOSIS — R972 Elevated prostate specific antigen [PSA]: Secondary | ICD-10-CM | POA: Diagnosis not present

## 2018-01-26 DIAGNOSIS — D1801 Hemangioma of skin and subcutaneous tissue: Secondary | ICD-10-CM | POA: Diagnosis not present

## 2018-01-26 DIAGNOSIS — R351 Nocturia: Secondary | ICD-10-CM | POA: Diagnosis not present

## 2018-01-26 DIAGNOSIS — L570 Actinic keratosis: Secondary | ICD-10-CM | POA: Diagnosis not present

## 2018-01-26 DIAGNOSIS — N401 Enlarged prostate with lower urinary tract symptoms: Secondary | ICD-10-CM | POA: Diagnosis not present

## 2018-01-26 DIAGNOSIS — N5201 Erectile dysfunction due to arterial insufficiency: Secondary | ICD-10-CM | POA: Diagnosis not present

## 2018-01-26 DIAGNOSIS — D229 Melanocytic nevi, unspecified: Secondary | ICD-10-CM | POA: Diagnosis not present

## 2018-02-16 LAB — CUP PACEART REMOTE DEVICE CHECK
Date Time Interrogation Session: 20190213231002
Implantable Pulse Generator Implant Date: 20160727

## 2018-02-20 ENCOUNTER — Ambulatory Visit (INDEPENDENT_AMBULATORY_CARE_PROVIDER_SITE_OTHER): Payer: Medicare Other | Admitting: *Deleted

## 2018-02-20 DIAGNOSIS — I639 Cerebral infarction, unspecified: Secondary | ICD-10-CM

## 2018-02-21 NOTE — Progress Notes (Signed)
Carelink Summary Report / Loop Recorder 

## 2018-03-08 DIAGNOSIS — G4733 Obstructive sleep apnea (adult) (pediatric): Secondary | ICD-10-CM | POA: Diagnosis not present

## 2018-03-27 ENCOUNTER — Ambulatory Visit (INDEPENDENT_AMBULATORY_CARE_PROVIDER_SITE_OTHER): Payer: Medicare Other | Admitting: *Deleted

## 2018-03-27 DIAGNOSIS — I639 Cerebral infarction, unspecified: Secondary | ICD-10-CM

## 2018-03-27 NOTE — Progress Notes (Signed)
Carelink Summary Report / Loop Recorder 

## 2018-03-28 DIAGNOSIS — M8588 Other specified disorders of bone density and structure, other site: Secondary | ICD-10-CM | POA: Diagnosis not present

## 2018-03-31 LAB — CUP PACEART REMOTE DEVICE CHECK
Date Time Interrogation Session: 20190318233706
Implantable Pulse Generator Implant Date: 20160727

## 2018-04-24 LAB — CUP PACEART REMOTE DEVICE CHECK
Date Time Interrogation Session: 20190421000813
Implantable Pulse Generator Implant Date: 20160727

## 2018-04-27 ENCOUNTER — Ambulatory Visit (INDEPENDENT_AMBULATORY_CARE_PROVIDER_SITE_OTHER): Payer: Medicare Other | Admitting: *Deleted

## 2018-04-27 DIAGNOSIS — I639 Cerebral infarction, unspecified: Secondary | ICD-10-CM | POA: Diagnosis not present

## 2018-04-27 NOTE — Progress Notes (Signed)
Carelink Summary Report / Loop Recorder 

## 2018-05-10 DIAGNOSIS — M1712 Unilateral primary osteoarthritis, left knee: Secondary | ICD-10-CM | POA: Diagnosis not present

## 2018-05-15 DIAGNOSIS — Z79899 Other long term (current) drug therapy: Secondary | ICD-10-CM | POA: Diagnosis not present

## 2018-05-15 DIAGNOSIS — E7800 Pure hypercholesterolemia, unspecified: Secondary | ICD-10-CM | POA: Diagnosis not present

## 2018-05-15 DIAGNOSIS — Z8673 Personal history of transient ischemic attack (TIA), 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 residual deficits: Secondary | ICD-10-CM | POA: Diagnosis not present

## 2018-05-16 ENCOUNTER — Ambulatory Visit (HOSPITAL_COMMUNITY): Payer: Medicare Other | Attending: Cardiovascular Disease

## 2018-05-16 ENCOUNTER — Other Ambulatory Visit: Payer: Self-pay

## 2018-05-16 DIAGNOSIS — I340 Nonrheumatic mitral (valve) insufficiency: Secondary | ICD-10-CM

## 2018-05-16 DIAGNOSIS — I5030 Unspecified diastolic (congestive) heart failure: Secondary | ICD-10-CM | POA: Diagnosis not present

## 2018-05-16 DIAGNOSIS — I639 Cerebral infarction, unspecified: Secondary | ICD-10-CM | POA: Diagnosis not present

## 2018-05-16 DIAGNOSIS — I253 Aneurysm of heart: Secondary | ICD-10-CM | POA: Insufficient documentation

## 2018-05-16 DIAGNOSIS — I341 Nonrheumatic mitral (valve) prolapse: Secondary | ICD-10-CM

## 2018-05-16 DIAGNOSIS — Q211 Atrial septal defect: Secondary | ICD-10-CM

## 2018-05-16 DIAGNOSIS — I058 Other rheumatic mitral valve diseases: Secondary | ICD-10-CM | POA: Insufficient documentation

## 2018-05-16 DIAGNOSIS — I420 Dilated cardiomyopathy: Secondary | ICD-10-CM | POA: Diagnosis not present

## 2018-05-16 DIAGNOSIS — Q2112 Patent foramen ovale: Secondary | ICD-10-CM

## 2018-05-16 HISTORY — DX: Nonrheumatic mitral (valve) prolapse: I34.1

## 2018-05-16 HISTORY — DX: Nonrheumatic mitral (valve) insufficiency: I34.0

## 2018-05-17 ENCOUNTER — Other Ambulatory Visit (HOSPITAL_COMMUNITY): Payer: Medicare Other

## 2018-05-22 LAB — CUP PACEART REMOTE DEVICE CHECK
MDC IDC PG IMPLANT DT: 20160727
MDC IDC SESS DTM: 20190524003905

## 2018-05-26 ENCOUNTER — Ambulatory Visit: Payer: Medicare Other | Admitting: Cardiovascular Disease

## 2018-05-30 ENCOUNTER — Ambulatory Visit (INDEPENDENT_AMBULATORY_CARE_PROVIDER_SITE_OTHER): Payer: Medicare Other | Admitting: *Deleted

## 2018-05-30 DIAGNOSIS — I639 Cerebral infarction, unspecified: Secondary | ICD-10-CM | POA: Diagnosis not present

## 2018-05-30 NOTE — Progress Notes (Signed)
Cardiology Office Note   Date:  06/05/2018   ID:  James Tate, DOB December 24, 1945, MRN [id number]  PCP:  Gaynelle Arabian, MD  Cardiologist:   Jenkins Rouge, MD   No chief complaint on file.     History of Present Illness: 72 y.o. CVA Rx [place] April 2016. Acute right facial numbness with slurred speech. MRI, carotids and telemetry unrevealing Rx initially with ASA and statin Full recovery Subsequent TEE cor triatriatum and PFO  Seen by Dr Burt Knack who felt no closure indicated at that time unless recurrence ILR placed by Dr Rayann Heman 07/02/15.  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 BPH (benign prostatic hyperplasia)   . Cor triatriatum   . Flat feet   . 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 Osteopenia    resolved with medical therapy (calcium and fosamax)  . PFO (patent foramen ovale)   . Sleep apnea   . Stroke ([place]) 4/16  . Wears hearing aid    both    Past Surgical History:  Procedure Laterality Date  . APPLICATION OF A-CELL OF BACK N/A 12/19/2014   Procedure: APPLICATION OF A-CELL OF BACK;  Surgeon: Theodoro Kos, DO;  Location: [facility];  Service: Plastics;  Laterality: N/A;  . CHOLECYSTECTOMY  1996  . EP IMPLANTABLE DEVICE N/A 07/02/2015   Procedure: Loop Recorder Insertion;  Surgeon: Thompson Grayer, MD;  Location: [facility];  Service: Cardiovascular;  Laterality: N/A;  . LASIK  2003  . MELANOMA EXCISION N/A 10/04/2014   Procedure: WIDE LOCAL EXCISION OF UPPER BACK MELANOMA ;  Surgeon: Gayland Curry, MD;  [facility];  Service: General;  Laterality:  N/A;  . PARATHYROIDECTOMY  2004  . TEE WITHOUT CARDIOVERSION N/A 06/12/2015   Procedure: TRANSESOPHAGEAL ECHOCARDIOGRAM (TEE);  Surgeon: Josue Hector, MD;  Location: [facility] ENDOSCOPY;  Service: Cardiovascular;  Laterality: N/A;  . TONSILLECTOMY       Current Outpatient Medications  Medication Sig Dispense Refill  . Acetaminophen (TYLENOL PO) Take 1-2 tablets by mouth daily as needed (for pain).    Marland Kitchen aspirin 325 MG tablet Take 325 mg by mouth every 6 (six) hours as needed for mild pain or moderate pain.    Marland Kitchen aspirin 81 MG tablet Take 81 mg by mouth daily.    Marland Kitchen CALCIUM PO Take 1 tablet by mouth every morning.    . Cholecalciferol (VITAMIN D PO) Take 1 tablet by mouth every morning.    . Cyanocobalamin (VITAMIN B-12 PO) Take 1 tablet by mouth every morning.    . Multiple Vitamin (MULTIVITAMIN) capsule Take 1 capsule by mouth daily.    . Omega-3 Fatty Acids (OMEGA 3 PO) Take 1 tablet by mouth every morning.    . rosuvastatin (CRESTOR) 5 MG tablet Take 5 mg by mouth daily.    . tadalafil (CIALIS) 20 MG tablet Take 20 mg by mouth daily as needed for erectile dysfunction.    . terbinafine (LAMISIL) 250 MG tablet Crush 2 tablets in fungi-nail solution and apply  once daily to affected toenails    . Vitamin K, Phytonadione, 100 MCG TABS Take 100 mcg by mouth daily.      No current facility-administered medications for this visit.     Allergies:   Codeine and Erythromycin    Social History:  The patient  reports that he quit smoking about 46 years ago. He has never used smokeless tobacco. He reports that he drinks alcohol. He reports that he does not use drugs.   Family History:  The patient's family history includes Heart Problems in his mother; Osteoporosis in his father; Other in his maternal grandfather.    ROS:  Please see the history of present illness.   Otherwise, review of systems are positive for none.   All other systems are reviewed and negative.    PHYSICAL EXAM: VS:  BP 128/80    Pulse 89   Ht 6' (1.829 m)   Wt 187 lb 12 oz (85.2 kg)   SpO2 95%   BMI 25.46 kg/m  , BMI Body mass index is 25.46 kg/m. Affect appropriate Healthy:  appears stated age 60: normal Neck supple with no adenopathy JVP normal no bruits no thyromegaly Lungs clear with no wheezing and good diaphragmatic motion Heart:  S1/S2 MR  murmur, no rub, gallop or click PMI normal Abdomen: benighn, BS positve, no tenderness, no AAA no bruit.  No HSM or HJR Distal pulses intact with no bruits No edema Neuro non-focal Skin warm and dry No muscular weakness     EKG:   05/26/15 SR rate 68 normal ECG  05/21/16  NSR rate 63  Normal ECG  05/17/17  SR rate 61 normal ECG 06/05/18  SR rate 79 normal    Recent Labs: No results found for requested labs within last 8760 hours.    Lipid Panel    Component Value Date/Time   CHOL 118 (L) 05/24/2016 1032   TRIG 88 05/24/2016 1032   HDL 51 05/24/2016 1032   CHOLHDL 2.3 05/24/2016 1032   VLDL 18 05/24/2016 1032   LDLCALC 49 05/24/2016 1032      Wt Readings from Last 3 Encounters:  06/05/18 187 lb 12 oz (85.2 kg)  05/17/17 185 lb 6.4 oz (84.1 kg)  05/21/16 187 lb 12.8 oz (85.2 kg)      Other studies Reviewed: Additional studies/ records that were reviewed today include: TEE, EP notes note Dr Burt Knack .    ASSESSMENT AND PLAN:  1. CVA: ASA no carotid DX  ILR with no PAF   Consider removal when battery EOL 2. PFO  Seen by Dr Burt Knack no indication for closure at this time Consider for recurrence of TIA without Other cause. Small shunt by echo done 05/16/18 3. Chol:   Continue low dose lipitor  Lab Results  Component Value Date   LDLCALC 49 05/24/2016   4. Prostate gave him Suezanne Jacquet [name]'s name as Dr Risa Grill is doing more administrative work PSA q 6 months no evidence of infection at this time  5. MR:  Prolapse with moderate MR by TTE done 05/16/18  Consider f/u echo in a year   Current medicines are reviewed at length with the patient today.   The patient does not have concerns regarding medicines.  The following changes have been made:  no change  Labs/ tests ordered today include: None    No orders of the defined types were placed in this encounter.    Disposition:   FU with me in a year  Signed, Jenkins Rouge, MD  06/05/2018 10:49 AM    [facility] [place], [place], [place]  [address] Phone: [phone]; Fax: [phone]

## 2018-05-31 NOTE — Progress Notes (Signed)
Carelink Summary Report / Loop Recorder 

## 2018-06-05 ENCOUNTER — Ambulatory Visit (INDEPENDENT_AMBULATORY_CARE_PROVIDER_SITE_OTHER): Payer: Medicare Other | Admitting: Cardiovascular Disease

## 2018-06-05 ENCOUNTER — Encounter: Payer: Self-pay | Admitting: Cardiovascular Disease

## 2018-06-05 VITALS — BP 128/80 | HR 89 | Ht 72.0 in | Wt 187.8 lb

## 2018-06-05 DIAGNOSIS — Q2112 Patent foramen ovale: Secondary | ICD-10-CM

## 2018-06-05 DIAGNOSIS — I340 Nonrheumatic mitral (valve) insufficiency: Secondary | ICD-10-CM

## 2018-06-05 DIAGNOSIS — I639 Cerebral infarction, unspecified: Secondary | ICD-10-CM | POA: Diagnosis not present

## 2018-06-05 DIAGNOSIS — Q211 Atrial septal defect: Secondary | ICD-10-CM

## 2018-06-05 NOTE — Patient Instructions (Signed)

## 2018-06-13 DIAGNOSIS — G4733 Obstructive sleep apnea (adult) (pediatric): Secondary | ICD-10-CM | POA: Diagnosis not present

## 2018-06-22 DIAGNOSIS — M25562 Pain in left knee: Secondary | ICD-10-CM | POA: Diagnosis not present

## 2018-06-22 DIAGNOSIS — M1712 Unilateral primary osteoarthritis, left knee: Secondary | ICD-10-CM | POA: Diagnosis not present

## 2018-07-03 ENCOUNTER — Ambulatory Visit (INDEPENDENT_AMBULATORY_CARE_PROVIDER_SITE_OTHER): Payer: Medicare Other | Admitting: *Deleted

## 2018-07-03 DIAGNOSIS — I639 Cerebral infarction, unspecified: Secondary | ICD-10-CM

## 2018-07-03 NOTE — Progress Notes (Signed)
Carelink Summary Report / Loop Recorder 

## 2018-07-04 LAB — CUP PACEART REMOTE DEVICE CHECK
Implantable Pulse Generator Implant Date: 20160727
MDC IDC SESS DTM: 20190626003651

## 2018-07-10 DIAGNOSIS — J1100 Influenza due to unidentified influenza virus with unspecified type of pneumonia: Secondary | ICD-10-CM | POA: Diagnosis not present

## 2018-07-26 DIAGNOSIS — L814 Other melanin hyperpigmentation: Secondary | ICD-10-CM | POA: Diagnosis not present

## 2018-07-26 DIAGNOSIS — L821 Other seborrheic keratosis: Secondary | ICD-10-CM | POA: Diagnosis not present

## 2018-07-26 DIAGNOSIS — D1801 Hemangioma of skin and subcutaneous tissue: Secondary | ICD-10-CM | POA: Diagnosis not present

## 2018-07-26 DIAGNOSIS — Z85820 Personal history of malignant melanoma of skin: Secondary | ICD-10-CM | POA: Diagnosis not present

## 2018-07-26 DIAGNOSIS — D229 Melanocytic nevi, unspecified: Secondary | ICD-10-CM | POA: Diagnosis not present

## 2018-08-04 ENCOUNTER — Ambulatory Visit (INDEPENDENT_AMBULATORY_CARE_PROVIDER_SITE_OTHER): Payer: Medicare Other | Admitting: *Deleted

## 2018-08-04 DIAGNOSIS — I639 Cerebral infarction, unspecified: Secondary | ICD-10-CM

## 2018-08-05 NOTE — Progress Notes (Signed)
Carelink Summary Report / Loop Recorder 

## 2018-08-14 LAB — CUP PACEART REMOTE DEVICE CHECK
Date Time Interrogation Session: 20190729004001
Implantable Pulse Generator Implant Date: 20160727

## 2018-08-30 LAB — CUP PACEART REMOTE DEVICE CHECK
Date Time Interrogation Session: 20190831003946
MDC IDC PG IMPLANT DT: 20160727

## 2018-09-06 ENCOUNTER — Ambulatory Visit (INDEPENDENT_AMBULATORY_CARE_PROVIDER_SITE_OTHER): Payer: Medicare Other | Admitting: *Deleted

## 2018-09-06 DIAGNOSIS — I639 Cerebral infarction, unspecified: Secondary | ICD-10-CM

## 2018-09-07 LAB — CUP PACEART REMOTE DEVICE CHECK
Implantable Pulse Generator Implant Date: 20160727
MDC IDC SESS DTM: 20191003003944

## 2018-09-07 NOTE — Progress Notes (Signed)
Carelink Summary Report / Loop Recorder 

## 2018-09-18 DIAGNOSIS — M1712 Unilateral primary osteoarthritis, left knee: Secondary | ICD-10-CM | POA: Diagnosis not present

## 2018-09-22 DIAGNOSIS — Z23 Encounter for immunization: Secondary | ICD-10-CM | POA: Diagnosis not present

## 2018-09-25 DIAGNOSIS — M1712 Unilateral primary osteoarthritis, left knee: Secondary | ICD-10-CM | POA: Diagnosis not present

## 2018-09-29 DIAGNOSIS — H903 Sensorineural hearing loss, bilateral: Secondary | ICD-10-CM | POA: Diagnosis not present

## 2018-10-02 DIAGNOSIS — M1712 Unilateral primary osteoarthritis, left knee: Secondary | ICD-10-CM | POA: Diagnosis not present

## 2018-10-09 ENCOUNTER — Ambulatory Visit (INDEPENDENT_AMBULATORY_CARE_PROVIDER_SITE_OTHER): Payer: Medicare Other | Admitting: *Deleted

## 2018-10-09 DIAGNOSIS — I639 Cerebral infarction, unspecified: Secondary | ICD-10-CM

## 2018-10-10 NOTE — Progress Notes (Signed)
Carelink Summary Report / Loop Recorder 

## 2018-11-06 DIAGNOSIS — M1712 Unilateral primary osteoarthritis, left knee: Secondary | ICD-10-CM | POA: Diagnosis not present

## 2018-11-06 DIAGNOSIS — M1711 Unilateral primary osteoarthritis, right knee: Secondary | ICD-10-CM | POA: Diagnosis not present

## 2018-11-08 ENCOUNTER — Other Ambulatory Visit: Payer: Self-pay | Admitting: Gastroenterology

## 2018-11-08 ENCOUNTER — Other Ambulatory Visit: Payer: Self-pay | Admitting: Orthopedic Surgery

## 2018-11-08 ENCOUNTER — Telehealth: Payer: Self-pay | Admitting: *Deleted

## 2018-11-08 DIAGNOSIS — Z86010 Personal history of colonic polyps: Secondary | ICD-10-CM

## 2018-11-08 NOTE — Telephone Encounter (Signed)
   [facility] Pre-operative Risk Assessment    Request for surgical clearance:  1. What type of surgery is being performed? LEFT TOTAL KNEE REPLACEMENT   2. When is this surgery scheduled?  12/22/18  3. What type of clearance is required (medical clearance vs. Pharmacy clearance to hold med vs. Both)?  PHARMACY  4. Are there any medications that need to be held prior to surgery and how long? NOT ON FORM BUT PT IS ON ASPIRIN   5. Practice name and name of physician performing surgery?   [facility] / DR. GRAVES  6. What is your office phone number [phone]    7.   What is your office fax number [phone]  8.   Anesthesia type (None, local, MAC, general) ? N/A   Jeanann Lewandowsky 11/08/2018, 3:13 PM  _________________________________________________________________   (provider comments below)

## 2018-11-10 NOTE — Telephone Encounter (Signed)
I spoke to Dr. Johnsie Cancel. No recommendations regarding PFO and surgery. He can be cleared.

## 2018-11-10 NOTE — Telephone Encounter (Signed)
   Primary Cardiologist: Jenkins Rouge, MD  Chart reviewed as part of pre-operative protocol coverage. Patient was contacted 11/10/2018 in reference to pre-operative risk assessment for pending surgery as outlined below.  James Tate was last seen on 06/05/2018 by Dr. Johnsie Cancel.  Since that day, James Tate has done well.  He has hx of cryptogenic stroke 03/2015. He has had no further stroke like symptoms. He has no known hx of CAD. He has hx of PFO with no indication for closure according to Dr. Burt Knack. He has a loop recorder in place with no findings of afib so far.   According to the [facility], Mr. James Tate is a class II risk with 0.9% risk of major cardiac event perioperatively. He is active and able to complete >4 Mets of activity.   Therefore, based on ACC/AHA guidelines, the patient would be at acceptable risk for the planned procedure without further cardiovascular testing.   I will route this recommendation to the requesting party via Epic fax function and remove from pre-op pool.  Please call with questions.  Daune Perch, NP 11/10/2018, 1:15 PM

## 2018-11-10 NOTE — Telephone Encounter (Signed)
I called the patient and he is doing well. He has hx of cryptogenic stroke 03/2015. He has had no further stroke like symptoms. He has no known hx of CAD. He has hx of cryptogenic stroke and PFO with no indication for closure according to Dr. Burt Knack. He has a loop recorder in place with no findings of afib so far.   According to the [facility], James Tate is a class II risk with 0.9% risk of major cardiac event perioperatively.   Per Up-To-Date, the longer the time since stroke without further stroke symptoms, the lower the risk for stroke perioperatively.   I will route this to Drs Johnsie Cancel and Burt Knack for any specific recommendations related the the PFO and surgery.

## 2018-11-10 NOTE — Telephone Encounter (Signed)
Agree. Important to be aggressive about DVT prophylaxis. No other specific recommendation.

## 2018-11-13 ENCOUNTER — Ambulatory Visit (INDEPENDENT_AMBULATORY_CARE_PROVIDER_SITE_OTHER): Payer: Medicare Other

## 2018-11-13 DIAGNOSIS — I639 Cerebral infarction, unspecified: Secondary | ICD-10-CM | POA: Diagnosis not present

## 2018-11-15 NOTE — Progress Notes (Signed)
Carelink Summary Report / Loop Recorder 

## 2018-11-30 ENCOUNTER — Telehealth: Payer: Self-pay

## 2018-11-30 NOTE — Telephone Encounter (Signed)
I left a message that pt battery has reach RRT. I left a message for him to call the device clinic back.

## 2018-11-30 NOTE — Telephone Encounter (Deleted)
Pt battery has reached RRT. I left a voicemail and advised the pt to call the device clinic back.

## 2018-12-01 NOTE — Telephone Encounter (Signed)
Spoke w/ pt and informed him that his loop recorder has reached RRT. I informed him of his options. Pt stated that he does want it removed. I informed him that I would have a scheduler call him to schedule an appt w/ MD to discuss the procedure. Pt verbalized understanding.

## 2018-12-02 LAB — CUP PACEART REMOTE DEVICE CHECK
Date Time Interrogation Session: 20191105004100
Implantable Pulse Generator Implant Date: 20160727

## 2018-12-07 ENCOUNTER — Other Ambulatory Visit: Payer: Medicare Other

## 2018-12-13 ENCOUNTER — Encounter: Payer: Self-pay | Admitting: Internal Medicine

## 2018-12-13 ENCOUNTER — Ambulatory Visit (INDEPENDENT_AMBULATORY_CARE_PROVIDER_SITE_OTHER): Payer: Medicare Other | Admitting: Internal Medicine

## 2018-12-13 VITALS — BP 108/72 | HR 68 | Ht 72.0 in | Wt 192.0 lb

## 2018-12-13 DIAGNOSIS — I639 Cerebral infarction, unspecified: Secondary | ICD-10-CM

## 2018-12-13 DIAGNOSIS — Q211 Atrial septal defect: Secondary | ICD-10-CM

## 2018-12-13 DIAGNOSIS — Q2112 Patent foramen ovale: Secondary | ICD-10-CM

## 2018-12-13 HISTORY — PX: OTHER SURGICAL HISTORY: SHX169

## 2018-12-13 NOTE — Progress Notes (Signed)
PCP: Gaynelle Arabian, MD Primary Cardiologist: Dr Johnsie Cancel Primary EP: Dr Elby Showers is a 73 y.o. male who presents today for electrophysiology followup.   His ILR has reached end of service.   Today, he denies symptoms of palpitations, chest pain, shortness of breath,  lower extremity edema, dizziness, presyncope, or syncope.  The patient is otherwise without complaint today.   Past Medical History:  Diagnosis Date  . BPH (benign prostatic hyperplasia)   . Cor triatriatum   . Flat feet   . 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 Osteopenia    resolved with medical therapy (calcium and fosamax)  . PFO (patent foramen ovale)   . Sleep apnea   . Stroke ([place]) 4/16  . Wears hearing aid    both   Past Surgical History:  Procedure Laterality Date  . APPLICATION OF A-CELL OF BACK N/A 12/19/2014   Procedure: APPLICATION OF A-CELL OF BACK;  Surgeon: Theodoro Kos, DO;  Location: [facility];  Service: Plastics;  Laterality: N/A;  . CHOLECYSTECTOMY  1996  . EP IMPLANTABLE DEVICE N/A 07/02/2015   Procedure: Loop Recorder Insertion;  Surgeon: Thompson Grayer, MD;  Location: [facility];  Service: Cardiovascular;  Laterality: N/A;  . LASIK  2003  . MELANOMA EXCISION N/A 10/04/2014   Procedure: WIDE LOCAL EXCISION OF UPPER BACK MELANOMA ;  Surgeon: Gayland Curry, MD;  Location: [facility];  Service: General;  Laterality: N/A;  . PARATHYROIDECTOMY  2004  . TEE WITHOUT CARDIOVERSION N/A 06/12/2015   Procedure: TRANSESOPHAGEAL ECHOCARDIOGRAM (TEE);  Surgeon: Josue Hector, MD;  Location[facility];  Service: Cardiovascular;  Laterality: N/A;  . TONSILLECTOMY      ROS- all systems are reviewed and negatives except as per HPI above  Current Outpatient Medications  Medication Sig Dispense Refill  . acetaminophen (TYLENOL) 325 MG tablet Take 1-2 tablets by mouth daily as needed (for pain).     Marland Kitchen aspirin 81 MG tablet Take 81 mg by mouth daily.    . Cholecalciferol (VITAMIN  D PO) Take 1 tablet by mouth every morning.    . Cyanocobalamin (VITAMIN B-12 PO) Take 1 tablet by mouth every morning.    . Miconazole Nitrate 2 % OINT Apply 1 application topically daily as needed.    . Multiple Minerals-Vitamins (CAL-MAG-ZINC-D PO) Take 1 tablet by mouth daily.    . Multiple Vitamin (MULTIVITAMIN) capsule Take 1 capsule by mouth daily.    . Omega-3 Fatty Acids (OMEGA 3 PO) Take 1 tablet by mouth every morning.    . rosuvastatin (CRESTOR) 5 MG tablet Take 5 mg by mouth daily.    . tadalafil (CIALIS) 20 MG tablet Take 20 mg by mouth daily as needed for erectile dysfunction.    . terbinafine (LAMISIL) 250 MG tablet Crush 2 tablets in fungi-nail solution and apply once daily to affected toenails    . triamcinolone (KENALOG) 0.1 % paste Use as directed 1 application in the mouth or throat 2 (two) times daily as needed (mouth sores).    . Vitamin K, Phytonadione, 100 MCG TABS Take 100 mcg by mouth daily.      No current facility-administered medications for this visit.     Physical Exam: Vitals:   12/13/18 1421  BP: 108/72  Pulse: 68  SpO2: 98%  Weight: 192 lb (87.1 kg)  Height: 6' (1.829 m)    GEN- The patient is well appearing, alert and oriented x 3 today.  Head- normocephalic, atraumatic Eyes-  Sclera clear, conjunctiva pink Ears- hearing intact Oropharynx- clear Lungs- Clear to ausculation bilaterally, normal work of breathing Heart- Regular rate and rhythm, no murmurs, rubs or gallops, PMI not laterally displaced GI- soft, NT, ND, + BS Extremities- no clubbing, cyanosis, or edema  Wt Readings from Last 3 Encounters:  12/13/18 192 lb (87.1 kg)  06/05/18 187 lb 12 oz (85.2 kg)  05/17/17 185 lb 6.4 oz (84.1 kg)    EKG tracing ordered today is personally reviewed and shows sinus rhythm 68 bpm, PR 188 msec, QRS 86 msec, Qtc 440 msec  Assessment and Plan:  1. Cryptogenic stroke He has been followed with ILR since 2016 without afib detected.  He is clear  that he wishes to have his loop recorder removed at this time.  Risks and benefits to ILR removal were discussed at length with the patient who wishes to proceed.  2. PFO Previously evaluated by Dr Burt Knack and opted to not close without recurrent stroke  Will plan ILR removal today Follow-up with EP prn going forward  Thompson Grayer MD, [facility] 12/13/2018    PROCEDURE NOTE:   1. Implantable loop recorder explantation      DESCRIPTION OF PROCEDURE:  Informed written consent was obtained.  The patient required no sedation for the procedure today.   The patients left chest was therefore prepped and draped in the usual sterile fashion.  The skin overlying the ILR monitor was infiltrated with lidocaine for local analgesia.  A 0.5-cm incision was made over the site.  The previously implanted ILR was exposed and removed using a combination of sharp and blunt dissection.  Steri- Strips and a sterile dressing were then applied. EBL<48ml.  There were no early apparent complications.     CONCLUSIONS:   1. Successful explantation of a [employer] Reveal LINQ implantable loop recorder   2. No early apparent complications.        Thompson Grayer MD, [facility] 12/13/2018 10:45 PM

## 2018-12-13 NOTE — Patient Instructions (Addendum)
Medication Instructions:  Your physician recommends that you continue on your current medications as directed. Please refer to the Current Medication list given to you today.  * If you need a refill on your cardiac medications before your next appointment, please call your pharmacy.   Labwork: None ordered  Testing/Procedures: Dr. Rayann Heman removed your loop recorder today.  Follow-Up: No follow up is needed at this time with Dr. Rayann Heman.  He will see you on an as needed basis.   Thank you for choosing [facility]!!     Implantable Loop Recorder Placement, Care After Refer to this sheet in the next few weeks. These instructions provide you with information about caring for yourself after your procedure. Your health care provider may also give you more specific instructions. Your treatment has been planned according to current medical practices, but problems sometimes occur. Call your health care provider if you have any problems or questions after your procedure. What can I expect after the procedure? After the procedure, it is common to have:  Soreness or pain near the cut from surgery (incision).  Some swelling or bruising near the incision.  Follow these instructions at home:  Medicines  Take over-the-counter and prescription medicines only as told by your health care provider.  If you were prescribed an antibiotic medicine, take it as told by your health care provider. Do not stop taking the antibiotic even if you start to feel better.  Bathing Do not take baths, swim, or use a hot tub until your health care provider approves. You may shower 24 hours after removal of your monitor.  Incision care  Follow instructions from your health care provider about how to take care of your incision. Make sure you: ? Remove your top dressing after 24 hours (before you shower) ? Leave stitches (sutures), skin glue, or adhesive strips in place. These skin closures may need to stay in  place for 2 weeks or longer. If adhesive strip edges start to loosen and curl up, you may trim the loose edges. Do not remove adhesive strips completely unless your health care provider tells you to do that.  Check your incision area every day for signs of infection. Check for: ? More redness, swelling, or pain. ? Fluid or blood. ? Warmth. ? Pus or a bad smell.  Contact a health care provider if:  You have more redness, swelling, or pain around your incision.  You have more fluid or blood coming from your incision.  Your incision feels warm to the touch.  You have pus or a bad smell coming from your incision.  You have a fever.  You have pain that is not relieved by your pain medicine.  You have triggered your device because of fainting (syncope) or because of a heartbeat that feels like it is racing, slow, fluttering, or skipping (palpitations).

## 2018-12-15 ENCOUNTER — Encounter (HOSPITAL_COMMUNITY): Payer: Self-pay

## 2018-12-15 ENCOUNTER — Other Ambulatory Visit: Payer: Self-pay | Admitting: Orthopedic Surgery

## 2018-12-15 NOTE — Pre-Procedure Instructions (Signed)
The following are in epic: Cardiac clearance J. Hammond telephone encoubter 11/08/2018 Last office visit note 12/13/2018 Dr. Rayann Heman EKG 12/13/2018 Loop recorder check 12/02/2018 ECHO 05/16/2018

## 2018-12-15 NOTE — Patient Instructions (Addendum)
Your procedure is scheduled on: Friday, Dec 22, 2018   Surgery Time:  10:00AM-12:01PM   Report to [facility]  Entrance    Report to admitting at 7:30 AM   Call this number if you have problems the morning of surgery [phone]   Bring CPAP mask and tubing day of surgery.   Do not eat food or drink liquids :After Midnight.   Brush your teeth the morning of surgery.   Do NOT smoke after Midnight   Take these medicines the morning of surgery with A SIP OF WATER: None                               You may not have any metal on your body including  jewelry, and body piercings             Do not wear lotions, powders, perfumes/cologne, or deodorant                          Men may shave face and neck.   Do not bring valuables to the hospital. [place].   Contacts, dentures or bridgework may not be worn into surgery.   Leave suitcase in the car. After surgery it may be brought to your room.    Special Instructions: Bring a copy of your healthcare power of attorney and living will documents         the day of surgery if you haven't scanned them in before.              Please read over the following fact sheets you were given:  [facility] - Preparing for Surgery Before surgery, you can play an important role.  Because skin is not sterile, your skin needs to be as free of germs as possible.  You can reduce the number of germs on your skin by washing with CHG (chlorahexidine gluconate) soap before surgery.  CHG is an antiseptic cleaner which kills germs and bonds with the skin to continue killing germs even after washing. Please DO NOT use if you have an allergy to CHG or antibacterial soaps.  If your skin becomes reddened/irritated stop using the CHG and inform your nurse when you arrive at Short Stay. Do not shave (including legs and underarms) for at least 48 hours prior to the first CHG shower.  You may shave  your face/neck.  Please follow these instructions carefully:  1.  Shower with CHG Soap the night before surgery and the  morning of surgery.  2.  If you choose to wash your hair, wash your hair first as usual with your normal  shampoo.  3.  After you shampoo, rinse your hair and body thoroughly to remove the shampoo.                             4.  Use CHG as you would any other liquid soap.  You can apply chg directly to the skin and wash.  Gently with a scrungie or clean washcloth.  5.  Apply the CHG Soap to your body ONLY FROM THE NECK DOWN.   Do   not use on face/ open  Wound or open sores. Avoid contact with eyes, ears mouth and   genitals (private parts).                       Wash face,  Genitals (private parts) with your normal soap.             6.  Wash thoroughly, paying special attention to the area where your    surgery  will be performed.  7.  Thoroughly rinse your body with warm water from the neck down.  8.  DO NOT shower/wash with your normal soap after using and rinsing off the CHG Soap.                9.  Pat yourself dry with a clean towel.            10.  Wear clean pajamas.            11.  Place clean sheets on your bed the night of your first shower and do not  sleep with pets. Day of Surgery : Do not apply any lotions/deodorants the morning of surgery.  Please wear clean clothes to the hospital/surgery center.  FAILURE TO FOLLOW THESE INSTRUCTIONS MAY RESULT IN THE CANCELLATION OF YOUR SURGERY  PATIENT SIGNATURE_________________________________  NURSE SIGNATURE__________________________________  ________________________________________________________________________   James Tate  An incentive spirometer is a tool that can help keep your lungs clear and active. This tool measures how well you are filling your lungs with each breath. Taking long deep breaths may help reverse or decrease the chance of developing breathing  (pulmonary) problems (especially infection) following:  A long period of time when you are unable to move or be active. BEFORE THE PROCEDURE   If the spirometer includes an indicator to show your best effort, your nurse or respiratory therapist will set it to a desired goal.  If possible, sit up straight or lean slightly forward. Try not to slouch.  Hold the incentive spirometer in an upright position. INSTRUCTIONS FOR USE  1. Sit on the edge of your bed if possible, or sit up as far as you can in bed or on a chair. 2. Hold the incentive spirometer in an upright position. 3. Breathe out normally. 4. Place the mouthpiece in your mouth and seal your lips tightly around it. 5. Breathe in slowly and as deeply as possible, raising the piston or the ball toward the top of the column. 6. Hold your breath for 3-5 seconds or for as long as possible. Allow the piston or ball to fall to the bottom of the column. 7. Remove the mouthpiece from your mouth and breathe out normally. 8. Rest for a few seconds and repeat Steps 1 through 7 at least 10 times every 1-2 hours when you are awake. Take your time and take a few normal breaths between deep breaths. 9. The spirometer may include an indicator to show your best effort. Use the indicator as a goal to work toward during each repetition. 10. After each set of 10 deep breaths, practice coughing to be sure your lungs are clear. If you have an incision (the cut made at the time of surgery), support your incision when coughing by placing a pillow or rolled up towels firmly against it. Once you are able to get out of bed, walk around indoors and cough well. You may stop using the incentive spirometer when instructed by your caregiver.  RISKS AND COMPLICATIONS  Take your time  so you do not get dizzy or light-headed.  If you are in pain, you may need to take or ask for pain medication before doing incentive spirometry. It is harder to take a deep breath if you  are having pain. AFTER USE  Rest and breathe slowly and easily.  It can be helpful to keep track of a log of your progress. Your caregiver can provide you with a simple table to help with this. If you are using the spirometer at home, follow these instructions: [place] IF:   You are having difficultly using the spirometer.  You have trouble using the spirometer as often as instructed.  Your pain medication is not giving enough relief while using the spirometer.  You develop fever of 100.5 F (38.1 C) or higher. SEEK IMMEDIATE MEDICAL CARE IF:   You cough up bloody sputum that had not been present before.  You develop fever of 102 F (38.9 C) or greater.  You develop worsening pain at or near the incision site. MAKE SURE YOU:   Understand these instructions.  Will watch your condition.  Will get help right away if you are not doing well or get worse. Document Released: 04/04/2007 Document Revised: 02/14/2012 Document Reviewed: 06/05/2007 ExitCare Patient Information 2014 Exit[employer], [place].   ________________________________________________________________________  WHAT IS A BLOOD TRANSFUSION? Blood Transfusion Information  A transfusion is the replacement of blood or some of its parts. Blood is made up of multiple cells which provide different functions.  Red blood cells carry oxygen and are used for blood loss replacement.  White blood cells fight against infection.  Platelets control bleeding.  Plasma helps clot blood.  Other blood products are available for specialized needs, such as hemophilia or other clotting disorders. BEFORE THE TRANSFUSION  Who gives blood for transfusions?   Healthy volunteers who are fully evaluated to make sure their blood is safe. This is blood bank blood. Transfusion therapy is the safest it has ever been in the practice of medicine. Before blood is taken from a donor, a complete history is taken to make sure that person has  no history of diseases nor engages in risky social behavior (examples are intravenous drug use or sexual activity with multiple partners). The donor's travel history is screened to minimize risk of transmitting infections, such as malaria. The donated blood is tested for signs of infectious diseases, such as HIV and hepatitis. The blood is then tested to be sure it is compatible with you in order to minimize the chance of a transfusion reaction. If you or a relative donates blood, this is often done in anticipation of surgery and is not appropriate for emergency situations. It takes many days to process the donated blood. RISKS AND COMPLICATIONS Although transfusion therapy is very safe and saves many lives, the main dangers of transfusion include:   Getting an infectious disease.  Developing a transfusion reaction. This is an allergic reaction to something in the blood you were given. Every precaution is taken to prevent this. The decision to have a blood transfusion has been considered carefully by your caregiver before blood is given. Blood is not given unless the benefits outweigh the risks. AFTER THE TRANSFUSION  Right after receiving a blood transfusion, you will usually feel much better and more energetic. This is especially true if your red blood cells have gotten low (anemic). The transfusion raises the level of the red blood cells which carry oxygen, and this usually causes an energy increase.  The  nurse administering the transfusion will monitor you carefully for complications. HOME CARE INSTRUCTIONS  No special instructions are needed after a transfusion. You may find your energy is better. Speak with your caregiver about any limitations on activity for underlying diseases you may have. SEEK MEDICAL CARE IF:   Your condition is not improving after your transfusion.  You develop redness or irritation at the intravenous (IV) site. SEEK IMMEDIATE MEDICAL CARE IF:  Any of the following  symptoms occur over the next 12 hours:  Shaking chills.  You have a temperature by mouth above 102 F (38.9 C), not controlled by medicine.  Chest, back, or muscle pain.  People around you feel you are not acting correctly or are confused.  Shortness of breath or difficulty breathing.  Dizziness and fainting.  You get a rash or develop hives.  You have a decrease in urine output.  Your urine turns a dark color or changes to pink, red, or brown. Any of the following symptoms occur over the next 10 days:  You have a temperature by mouth above 102 F (38.9 C), not controlled by medicine.  Shortness of breath.  Weakness after normal activity.  The white part of the eye turns yellow (jaundice).  You have a decrease in the amount of urine or are urinating less often.  Your urine turns a dark color or changes to pink, red, or brown. Document Released: 11/19/2000 Document Revised: 02/14/2012 Document Reviewed: 07/08/2008 [facility] Patient Information 2014 [place], [place].  _______________________________________________________________________

## 2018-12-15 NOTE — Care Plan (Signed)
Spoke with patient prior to surgery. Will discharge to home with wife and HHPT. Has equipment. CPM to be delivered. HHPT referral to [facility]. Will transition to [place] at [facility] [place]

## 2018-12-18 ENCOUNTER — Ambulatory Visit (HOSPITAL_COMMUNITY)
Admission: RE | Admit: 2018-12-18 | Discharge: 2018-12-18 | Disposition: A | Discharge status: Home / Self Care | Payer: Medicare Other | Source: Ambulatory Visit | Attending: Orthopedic Surgery | Admitting: Orthopedic Surgery

## 2018-12-18 ENCOUNTER — Other Ambulatory Visit: Payer: Self-pay

## 2018-12-18 ENCOUNTER — Encounter (HOSPITAL_COMMUNITY)
Admission: RE | Admit: 2018-12-18 | Discharge: 2018-12-18 | Disposition: A | Discharge status: Home / Self Care | Payer: Medicare Other | Source: Ambulatory Visit | Attending: Orthopedic Surgery | Admitting: Orthopedic Surgery

## 2018-12-18 ENCOUNTER — Encounter (HOSPITAL_COMMUNITY): Payer: Self-pay

## 2018-12-18 DIAGNOSIS — Z01811 Encounter for preprocedural respiratory examination: Secondary | ICD-10-CM | POA: Diagnosis not present

## 2018-12-18 DIAGNOSIS — Z01818 Encounter for other preprocedural examination: Secondary | ICD-10-CM | POA: Diagnosis not present

## 2018-12-18 HISTORY — DX: Nonrheumatic mitral (valve) prolapse: I34.1

## 2018-12-18 HISTORY — DX: Obstructive sleep apnea (adult) (pediatric): G47.33

## 2018-12-18 HISTORY — DX: Unspecified osteoarthritis, unspecified site: M19.90

## 2018-12-18 HISTORY — DX: Cerebral infarction, unspecified: I63.9

## 2018-12-18 HISTORY — DX: Nonrheumatic mitral (valve) insufficiency: I34.0

## 2018-12-18 HISTORY — DX: Personal history of colon polyps, unspecified: Z86.0100

## 2018-12-18 HISTORY — DX: Personal history of other diseases of the digestive system: Z87.19

## 2018-12-18 HISTORY — DX: Personal history of colonic polyps: Z86.010

## 2018-12-18 HISTORY — DX: Malignant melanoma of other part of trunk: C43.59

## 2018-12-18 HISTORY — DX: Dependence on other enabling machines and devices: Z99.89

## 2018-12-18 HISTORY — DX: Sensorineural hearing loss, bilateral: H90.3

## 2018-12-18 HISTORY — DX: Personal history of other endocrine, nutritional and metabolic disease: Z86.39

## 2018-12-18 LAB — COMPREHENSIVE METABOLIC PANEL
ALT: 30 U/L (ref 0–44)
AST: 32 U/L (ref 15–41)
Albumin: 4.5 g/dL (ref 3.5–5.0)
Alkaline Phosphatase: 53 U/L (ref 38–126)
Anion gap: 11 (ref 5–15)
BILIRUBIN TOTAL: 1.9 mg/dL — AB (ref 0.3–1.2)
BUN: 21 mg/dL (ref 8–23)
CO2: 27 mmol/L (ref 22–32)
Calcium: 9.4 mg/dL (ref 8.9–10.3)
Chloride: 103 mmol/L (ref 98–111)
Creatinine, Ser: 0.86 mg/dL (ref 0.61–1.24)
GFR calc Af Amer: >60 mL/min (ref >60)
GFR calc non Af Amer: >60 mL/min (ref >60)
Glucose, Bld: 104 mg/dL — ABNORMAL HIGH (ref 70–99)
Potassium: 4.3 mmol/L (ref 3.5–5.1)
Sodium: 141 mmol/L (ref 135–145)
TOTAL PROTEIN: 7.5 g/dL (ref 6.5–8.1)

## 2018-12-18 LAB — CBC WITH DIFFERENTIAL/PLATELET
Abs Immature Granulocytes: 0.03 10*3/uL (ref 0.00–0.07)
Basophils Absolute: 0.1 10*3/uL (ref 0.0–0.1)
Basophils Relative: 1 %
EOS PCT: 5 %
Eosinophils Absolute: 0.4 10*3/uL (ref 0.0–0.5)
HCT: 46.2 % (ref 39.0–52.0)
Hemoglobin: 15.3 g/dL (ref 13.0–17.0)
Immature Granulocytes: 0 %
Lymphocytes Relative: 17 %
Lymphs Abs: 1.4 10*3/uL (ref 0.7–4.0)
MCH: 30.3 pg (ref 26.0–34.0)
MCHC: 33.1 g/dL (ref 30.0–36.0)
MCV: 91.5 fL (ref 80.0–100.0)
Monocytes Absolute: 1 10*3/uL (ref 0.1–1.0)
Monocytes Relative: 13 %
Neutro Abs: 4.9 10*3/uL (ref 1.7–7.7)
Neutrophils Relative %: 64 %
Platelets: 247 10*3/uL (ref 150–400)
RBC: 5.05 MIL/uL (ref 4.22–5.81)
RDW: 12.3 % (ref 11.5–15.5)
WBC: 7.8 10*3/uL (ref 4.0–10.5)
nRBC: 0 % (ref 0.0–0.2)

## 2018-12-18 LAB — URINALYSIS, ROUTINE W REFLEX MICROSCOPIC
Bacteria, UA: NONE SEEN
Bilirubin Urine: NEGATIVE
Glucose, UA: NEGATIVE mg/dL
Hgb urine dipstick: NEGATIVE
Ketones, ur: NEGATIVE mg/dL
Leukocytes, UA: NEGATIVE
Nitrite: NEGATIVE
Protein, ur: 30 mg/dL — AB
Specific Gravity, Urine: 1.021 (ref 1.005–1.030)
pH: 8 (ref 5.0–8.0)

## 2018-12-18 LAB — PROTIME-INR
INR: 1.03
Prothrombin Time: 13.4 seconds (ref 11.4–15.2)

## 2018-12-18 LAB — APTT: aPTT: 31 seconds (ref 24–36)

## 2018-12-18 LAB — SURGICAL PCR SCREEN
MRSA, PCR: NEGATIVE
STAPHYLOCOCCUS AUREUS: POSITIVE — AB

## 2018-12-18 LAB — ABO/RH: ABO/RH(D): B POS

## 2018-12-18 NOTE — Pre-Procedure Instructions (Signed)
CMP and UA results 12/18/2018 sent to Dr. Berenice Primas via epic.

## 2018-12-19 NOTE — Pre-Procedure Instructions (Signed)
PCR results 12/18/2018 sent to Dr. Berenice Primas via epic.

## 2018-12-20 NOTE — Anesthesia Preprocedure Evaluation (Addendum)
Anesthesia Evaluation  Patient identified by MRN, date of birth, ID band Patient awake    Reviewed: Allergy & Precautions, NPO status , Patient's Chart, lab work & pertinent test results  History of Anesthesia Complications Negative for: history of anesthetic complications  Airway Mallampati: II  TM Distance: >3 FB Neck ROM: Full    Dental no notable dental hx.    Pulmonary sleep apnea and Continuous Positive Airway Pressure Ventilation , former smoker,    Pulmonary exam normal        Cardiovascular Normal cardiovascular exam+ Valvular Problems/Murmurs MVP and MR      Neuro/Psych CVA, No Residual Symptoms negative psych ROS   GI/Hepatic negative GI ROS, Neg liver ROS,   Endo/Other  negative endocrine ROS  Renal/GU negative Renal ROS  negative genitourinary   Musculoskeletal  (+) Arthritis , Osteoarthritis,    Abdominal   Peds  Hematology negative hematology ROS (+)   Anesthesia Other Findings 73 yo former smoker (quit 09/27/71) with h/o stroke (4/16), hyperparathyroidism, BPH, OSA with CPAP use, PFO, h/o MVP with MR on Echo scheduled for above surgery on 12/22/18 with Dr. Dorna Leitz.   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  Pt had full recovery, placed on ASA and atorvastatin.  He was last seen by electrophysiology on 12/13/2018.  ILR has reached end of service, plans to have it removed.   Last seen by Cardiology on 06/05/2018 with Dr. Johnsie Cancel.  PFO with no indication for closure at this time. MVP with moderate regurgitation by TTE 05/16/18, Dr. Johnsie Cancel recommends echo in 1 year.  Per Daune Perch, NP 11/10/18 note, "Marlise Eves last seen on 7/1/2019by Dr. Johnsie Cancel. Since that day, LEE KUANG done well.  He has hx of cryptogenic stroke 03/2015. He has had no further stroke like symptoms. He has no known hx of CAD. He has hx of PFO with no indication for closure according to Dr.  Burt Knack. He has a loop recorder in place with no findings of afib so far. According to the [facility], Mr. Coufal is a class II risk with 0.9% risk of major cardiac event perioperatively.He is active and able to complete >4 Mets of activity.Therefore, based on ACC/AHA guidelines, the patient would be at acceptable risk for the planned procedure without further cardiovascular testing."    Reproductive/Obstetrics                          Anesthesia Physical Anesthesia Plan  ASA: III  Anesthesia Plan: Spinal   Post-op Pain Management:  [facility] for Post-op pain   Induction:   PONV Risk Score and Plan: 1 and Propofol infusion and Treatment may vary due to age or medical condition  Airway Management Planned: Nasal Cannula and Simple Face Mask  Additional Equipment: None  Intra-op Plan:   Post-operative Plan:   Informed Consent: I have reviewed the patients History and Physical, chart, labs and discussed the procedure including the risks, benefits and alternatives for the proposed anesthesia with the patient or authorized representative who has indicated his/her understanding and acceptance.       Plan Discussed with:   Anesthesia Plan Comments: (See PST note 12/18/18, Konrad Felix, PA-C)      Anesthesia Quick Evaluation

## 2018-12-20 NOTE — Progress Notes (Signed)
Anesthesia Chart Review   Case:  [id number] Date/Time:  12/22/18 0945   Procedure:  TOTAL KNEE ARTHROPLASTY (Left )   Anesthesia type:  Spinal   Pre-op diagnosis:  LEFT KNEE OSTEOARTHRITIS   Location:  [facility] [facility] / [facility]   Surgeon:  Dorna Leitz, MD      DISCUSSION: 73 yo former smoker (quit 09/27/71) with h/o stroke (4/16), hyperparathyroidism, BPH, OSA with CPAP use, PFO, h/o MVP with MR on Echo scheduled for above surgery on 12/22/18 with Dr. Dorna Leitz.   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  Pt had full recovery, placed on ASA and atorvastatin.  He was last seen by electrophysiology on 12/13/2018.  ILR has reached end of service, plans to have it removed.   Last seen by Cardiology on 06/05/2018 with Dr. Johnsie Cancel.  PFO with no indication for closure at this time. MVP with moderate regurgitation by TTE 05/16/18, Dr. Johnsie Cancel recommends echo in 1 year.  Per Daune Perch, NP 11/10/18 note, "Janyth Contes was last seen on 06/05/2018 by Dr. Johnsie Cancel.  Since that day, ALCIDES NUTTING has done well.  He has hx of cryptogenic stroke 03/2015. He has had no further stroke like symptoms. He has no known hx of CAD. He has hx of PFO with no indication for closure according to Dr. Burt Knack. He has a loop recorder in place with no findings of afib so far. According to the [facility], Mr. Maneri is a class II risk with 0.9% risk of major cardiac event perioperatively. He is active and able to complete >4 Mets of activity. Therefore, based on ACC/AHA guidelines, the patient would be at acceptable risk for the planned procedure without further cardiovascular testing."    Pt can proceed with planned procedure barring acute status change.  VS: BP 121/70   Pulse 70   Temp 37.1 C (Oral)   Resp 16   Ht 6' (1.829 m)   Wt 85.3 kg   SpO2 97%   BMI 25.50 kg/m   PROVIDERS: Gaynelle Arabian, MD is PCP  Jenkins Rouge, MD is Cardiologist  LABS: Labs reviewed: Acceptable for surgery. (all labs  ordered are listed, but only abnormal results are displayed)  Labs Reviewed  SURGICAL PCR SCREEN - Abnormal; Notable for the following components:      Result Value   Staphylococcus aureus POSITIVE (*)    All other components within normal limits  COMPREHENSIVE METABOLIC PANEL - Abnormal; Notable for the following components:   Glucose, Bld 104 (*)    Total Bilirubin 1.9 (*)    All other components within normal limits  URINALYSIS, ROUTINE W REFLEX MICROSCOPIC - Abnormal; Notable for the following components:   Protein, ur 30 (*)    All other components within normal limits  APTT  CBC WITH DIFFERENTIAL/PLATELET  PROTIME-INR  TYPE AND SCREEN  ABO/RH     IMAGES: Chest Xray 12/18/18 FINDINGS: The heart size and mediastinal contours are within normal limits. Both lungs are clear. The visualized skeletal structures are unremarkable.  IMPRESSION: No active cardiopulmonary disease.  EKG: 12/13/2018 Rate 68bpm Normal sinus rhythm Normal ECG  CV: 2D Echo 04/04/2015: Study Conclusions - Left ventricle: The cavity size was normal. Wall thickness was increased in a pattern of mild LVH. Systolic function was normal. The estimated ejection fraction was in the range of 55% to 60%. - Mitral valve: There was mild regurgitation. - Left atrium: The atrium was mildly dilated. 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  TEE 7/72016: Normal LV EF 65% No LAA thrombus No aortic debris Normal RV Mild MR/TR Normal aortic root No VSD  Atrial septal aneurysm No fenestrations PFO with positive bubble study Right sided RA cor triatriatum Past Medical History:  Diagnosis Date  . Arthritis   . BPH (benign prostatic hyperplasia)   . Cor triatriatum   . Cryptogenic stroke ([place]) 4/16  . Flat feet   . History of colon polyps   . History of gallstones   . History of  hyperparathyroidism   . Melanoma of back ([place])   . MR (mitral regurgitation) 05/16/2018   Moderate noted on ECHO  . MVP (mitral valve prolapse) 05/16/2018   Noted on ECHO  . OSA on CPAP   . Osteopenia    resolved with medical therapy (calcium and fosamax)  . PFO (patent foramen ovale)   . Sensorineural hearing loss (SNHL) of both ears   . Wears hearing aid    both    Past Surgical History:  Procedure Laterality Date  . APPLICATION OF A-CELL OF BACK N/A 12/19/2014   Procedure: APPLICATION OF A-CELL OF BACK;  Surgeon: Theodoro Kos, DO;  Location: [place];  Service: Plastics;  Laterality: N/A;  . CHOLECYSTECTOMY  1996  . COLONOSCOPY    . EP IMPLANTABLE DEVICE N/A 07/02/2015   Procedure: Loop Recorder Insertion;  Surgeon: Thompson Grayer, MD;  Location: [place] [facility];  Service: Cardiovascular;  Laterality: N/A;  . implantable loop recorder removal  12/13/2018   MDT LINQ removed in office by Dr Rayann Heman  . LASIK  2003  . MELANOMA EXCISION N/A 10/04/2014   Procedure: WIDE LOCAL EXCISION OF UPPER BACK MELANOMA ;  Surgeon: Gayland Curry, MD;  [facility];  Service: General;  Laterality: N/A;  . PARATHYROIDECTOMY  2004  . TEE WITHOUT CARDIOVERSION N/A 06/12/2015   Procedure: TRANSESOPHAGEAL ECHOCARDIOGRAM (TEE);  Surgeon: Josue Hector, MD;  Location: [facility] ENDOSCOPY;  Service: Cardiovascular;  Laterality: N/A;  . TONSILLECTOMY      MEDICATIONS: . acetaminophen (TYLENOL) 325 MG tablet  . aspirin 81 MG tablet  . Cholecalciferol (VITAMIN D PO)  . Cyanocobalamin (VITAMIN B-12 PO)  . Miconazole Nitrate 2 % OINT  . Multiple Minerals-Vitamins (CAL-MAG-ZINC-D PO)  . Multiple Vitamin (MULTIVITAMIN) capsule  . Omega-3 Fatty Acids (OMEGA 3 PO)  . rosuvastatin (CRESTOR) 5 MG tablet  . tadalafil (CIALIS) 20 MG tablet  . terbinafine (LAMISIL) 250 MG tablet  . triamcinolone (KENALOG) 0.1 % paste  . Vitamin K, Phytonadione, 100 MCG TABS   No current facility-administered  medications for this encounter.      Maia Plan [facility] Pre-Surgical Testing [phone] 12/20/18 11:09 AM

## 2018-12-21 MED ORDER — BUPIVACAINE LIPOSOME 1.3 % IJ SUSP
20.0000 mL | Freq: Once | INTRAMUSCULAR | Status: DC
  Filled 2018-12-21: qty 20

## 2018-12-22 ENCOUNTER — Ambulatory Visit (HOSPITAL_COMMUNITY): Payer: Medicare Other | Admitting: Certified Registered Nurse Anesthetist

## 2018-12-22 ENCOUNTER — Other Ambulatory Visit: Payer: Self-pay

## 2018-12-22 ENCOUNTER — Encounter (HOSPITAL_COMMUNITY)
Admission: RE | Disposition: A | Discharge status: Home / Self Care | Payer: Self-pay | Source: Home / Self Care | Attending: Orthopedic Surgery

## 2018-12-22 ENCOUNTER — Ambulatory Visit (HOSPITAL_COMMUNITY): Payer: Medicare Other | Admitting: Physician Assistant

## 2018-12-22 ENCOUNTER — Encounter (HOSPITAL_COMMUNITY): Payer: Self-pay | Admitting: *Deleted

## 2018-12-22 ENCOUNTER — Observation Stay (HOSPITAL_COMMUNITY)
Admission: RE | Admit: 2018-12-22 | Discharge: 2018-12-23 | Disposition: A | Discharge status: Home / Self Care | Payer: Medicare Other | Attending: Orthopedic Surgery | Admitting: Orthopedic Surgery

## 2018-12-22 DIAGNOSIS — G4733 Obstructive sleep apnea (adult) (pediatric): Secondary | ICD-10-CM | POA: Diagnosis not present

## 2018-12-22 DIAGNOSIS — Z9989 Dependence on other enabling machines and devices: Secondary | ICD-10-CM | POA: Diagnosis not present

## 2018-12-22 DIAGNOSIS — Z87891 Personal history of nicotine dependence: Secondary | ICD-10-CM | POA: Insufficient documentation

## 2018-12-22 DIAGNOSIS — Z79899 Other long term (current) drug therapy: Secondary | ICD-10-CM | POA: Insufficient documentation

## 2018-12-22 DIAGNOSIS — Z85820 Personal history of malignant melanoma of skin: Secondary | ICD-10-CM | POA: Diagnosis not present

## 2018-12-22 DIAGNOSIS — Z7982 Long term (current) use of aspirin: Secondary | ICD-10-CM | POA: Insufficient documentation

## 2018-12-22 DIAGNOSIS — M1712 Unilateral primary osteoarthritis, left knee: Secondary | ICD-10-CM | POA: Diagnosis not present

## 2018-12-22 DIAGNOSIS — M25562 Pain in left knee: Secondary | ICD-10-CM | POA: Diagnosis present

## 2018-12-22 DIAGNOSIS — Z8673 Personal history of transient ischemic attack (TIA), 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 residual deficits: Secondary | ICD-10-CM | POA: Diagnosis not present

## 2018-12-22 DIAGNOSIS — G8918 Other acute postprocedural pain: Secondary | ICD-10-CM | POA: Diagnosis not present

## 2018-12-22 HISTORY — DX: Unilateral primary osteoarthritis, left knee: M17.12

## 2018-12-22 HISTORY — PX: TOTAL KNEE ARTHROPLASTY: SHX125

## 2018-12-22 LAB — TYPE AND SCREEN
ABO/RH(D): B POS
Antibody Screen: NEGATIVE

## 2018-12-22 SURGERY — ARTHROPLASTY, KNEE, TOTAL
Anesthesia: Spinal | Site: Knee | Laterality: Left

## 2018-12-22 MED ORDER — OXYCODONE HCL 5 MG PO TABS
5.0000 mg | ORAL_TABLET | ORAL | Status: DC | PRN
  Administered 2018-12-23: 5 mg via ORAL
  Filled 2018-12-22: qty 1

## 2018-12-22 MED ORDER — CELECOXIB 200 MG PO CAPS
200.0000 mg | ORAL_CAPSULE | Freq: Two times a day (BID) | ORAL | Status: DC
  Administered 2018-12-22 – 2018-12-23 (×2): 200 mg via ORAL
  Filled 2018-12-22 (×2): qty 1

## 2018-12-22 MED ORDER — ALUM & MAG HYDROXIDE-SIMETH 200-200-20 MG/5ML PO SUSP: 30.0000 mL | ORAL | Status: DC | PRN

## 2018-12-22 MED ORDER — SODIUM CHLORIDE 0.9% FLUSH
INTRAVENOUS | Status: DC | PRN
  Administered 2018-12-22: 50 mL

## 2018-12-22 MED ORDER — BUPIVACAINE-EPINEPHRINE (PF) 0.25% -1:200000 IJ SOLN
INTRAMUSCULAR | Status: AC
  Filled 2018-12-22: qty 30

## 2018-12-22 MED ORDER — OXYCODONE HCL 5 MG PO TABS: 5.0000 mg | ORAL_TABLET | Freq: Once | ORAL | Status: DC | PRN

## 2018-12-22 MED ORDER — ONDANSETRON HCL 4 MG/2ML IJ SOLN: 4.0000 mg | Freq: Once | INTRAMUSCULAR | Status: DC | PRN

## 2018-12-22 MED ORDER — DOCUSATE SODIUM 100 MG PO CAPS
100.0000 mg | ORAL_CAPSULE | Freq: Two times a day (BID) | ORAL | Status: DC
  Administered 2018-12-22 – 2018-12-23 (×2): 100 mg via ORAL
  Filled 2018-12-22 (×2): qty 1

## 2018-12-22 MED ORDER — CHLORHEXIDINE GLUCONATE 4 % EX LIQD: 60.0000 mL | Freq: Once | CUTANEOUS | Status: DC

## 2018-12-22 MED ORDER — ONDANSETRON HCL 4 MG/2ML IJ SOLN: 4.0000 mg | Freq: Four times a day (QID) | INTRAMUSCULAR | Status: DC | PRN

## 2018-12-22 MED ORDER — METHOCARBAMOL 500 MG IVPB - SIMPLE MED
INTRAVENOUS | Status: AC
  Filled 2018-12-22: qty 50

## 2018-12-22 MED ORDER — CEFAZOLIN SODIUM-DEXTROSE 2-4 GM/100ML-% IV SOLN
2.0000 g | INTRAVENOUS | Status: AC
  Administered 2018-12-22: 2 g via INTRAVENOUS
  Filled 2018-12-22: qty 100

## 2018-12-22 MED ORDER — WATER FOR IRRIGATION, STERILE IR SOLN
Status: DC | PRN
  Administered 2018-12-22 (×2): 1000 mL

## 2018-12-22 MED ORDER — DEXAMETHASONE SODIUM PHOSPHATE 10 MG/ML IJ SOLN
10.0000 mg | Freq: Two times a day (BID) | INTRAMUSCULAR | Status: DC
  Administered 2018-12-22 – 2018-12-23 (×2): 10 mg via INTRAVENOUS
  Filled 2018-12-22 (×2): qty 1

## 2018-12-22 MED ORDER — ROPIVACAINE HCL 5 MG/ML IJ SOLN
INTRAMUSCULAR | Status: DC | PRN
  Administered 2018-12-22: 30 mL via PERINEURAL

## 2018-12-22 MED ORDER — SODIUM CHLORIDE 0.9 % IR SOLN
Status: DC | PRN
  Administered 2018-12-22: 3000 mL

## 2018-12-22 MED ORDER — METHOCARBAMOL 500 MG PO TABS: 500.0000 mg | ORAL_TABLET | Freq: Four times a day (QID) | ORAL | Status: DC | PRN

## 2018-12-22 MED ORDER — ASPIRIN EC 325 MG PO TBEC
325.0000 mg | DELAYED_RELEASE_TABLET | Freq: Two times a day (BID) | ORAL | Status: DC
  Administered 2018-12-22 – 2018-12-23 (×2): 325 mg via ORAL
  Filled 2018-12-22 (×2): qty 1

## 2018-12-22 MED ORDER — DEXAMETHASONE SODIUM PHOSPHATE 10 MG/ML IJ SOLN
INTRAMUSCULAR | Status: DC | PRN
  Administered 2018-12-22: 10 mg via INTRAVENOUS

## 2018-12-22 MED ORDER — 0.9 % SODIUM CHLORIDE (POUR BTL) OPTIME
TOPICAL | Status: DC | PRN
  Administered 2018-12-22: 1000 mL

## 2018-12-22 MED ORDER — TRANEXAMIC ACID-NACL 1000-0.7 MG/100ML-% IV SOLN
1000.0000 mg | Freq: Once | INTRAVENOUS | Status: AC
  Administered 2018-12-22: 1000 mg via INTRAVENOUS
  Filled 2018-12-22: qty 100

## 2018-12-22 MED ORDER — SODIUM CHLORIDE (PF) 0.9 % IJ SOLN
INTRAMUSCULAR | Status: AC
  Filled 2018-12-22: qty 50

## 2018-12-22 MED ORDER — MIDAZOLAM HCL 2 MG/2ML IJ SOLN
1.0000 mg | INTRAMUSCULAR | Status: DC
  Administered 2018-12-22: 1 mg via INTRAVENOUS
  Filled 2018-12-22: qty 2

## 2018-12-22 MED ORDER — ONDANSETRON HCL 4 MG PO TABS: 4.0000 mg | ORAL_TABLET | Freq: Four times a day (QID) | ORAL | Status: DC | PRN

## 2018-12-22 MED ORDER — FENTANYL CITRATE (PF) 100 MCG/2ML IJ SOLN
50.0000 ug | INTRAMUSCULAR | Status: DC
  Administered 2018-12-22: 50 ug via INTRAVENOUS
  Filled 2018-12-22: qty 2

## 2018-12-22 MED ORDER — BUPIVACAINE LIPOSOME 1.3 % IJ SUSP
INTRAMUSCULAR | Status: DC | PRN
  Administered 2018-12-22: 20 mL

## 2018-12-22 MED ORDER — OXYCODONE-ACETAMINOPHEN 5-325 MG PO TABS: 1.0000 | ORAL_TABLET | Freq: Four times a day (QID) | ORAL | 0 refills | Status: DC | PRN

## 2018-12-22 MED ORDER — POLYETHYLENE GLYCOL 3350 17 G PO PACK: 17.0000 g | PACK | Freq: Every day | ORAL | Status: DC | PRN

## 2018-12-22 MED ORDER — HYDROMORPHONE HCL 1 MG/ML IJ SOLN: 0.5000 mg | INTRAMUSCULAR | Status: DC | PRN

## 2018-12-22 MED ORDER — CEFAZOLIN SODIUM-DEXTROSE 2-4 GM/100ML-% IV SOLN
2.0000 g | Freq: Four times a day (QID) | INTRAVENOUS | Status: AC
  Administered 2018-12-22 (×2): 2 g via INTRAVENOUS
  Filled 2018-12-22 (×2): qty 100

## 2018-12-22 MED ORDER — FENTANYL CITRATE (PF) 100 MCG/2ML IJ SOLN: 25.0000 ug | INTRAMUSCULAR | Status: DC | PRN

## 2018-12-22 MED ORDER — SODIUM CHLORIDE 0.9 % IV SOLN
INTRAVENOUS | Status: DC
  Administered 2018-12-22: 14:00:00 via INTRAVENOUS

## 2018-12-22 MED ORDER — TRANEXAMIC ACID-NACL 1000-0.7 MG/100ML-% IV SOLN
1000.0000 mg | INTRAVENOUS | Status: AC
  Administered 2018-12-22: 1000 mg via INTRAVENOUS
  Filled 2018-12-22: qty 100

## 2018-12-22 MED ORDER — BISACODYL 5 MG PO TBEC: 5.0000 mg | DELAYED_RELEASE_TABLET | Freq: Every day | ORAL | Status: DC | PRN

## 2018-12-22 MED ORDER — SODIUM CHLORIDE 0.9 % IV SOLN
INTRAVENOUS | Status: DC | PRN
  Administered 2018-12-22: 50 ug/min via INTRAVENOUS

## 2018-12-22 MED ORDER — PROPOFOL 500 MG/50ML IV EMUL
INTRAVENOUS | Status: DC | PRN
  Administered 2018-12-22: 100 ug/kg/min via INTRAVENOUS

## 2018-12-22 MED ORDER — ACETAMINOPHEN 325 MG PO TABS
325.0000 mg | ORAL_TABLET | Freq: Four times a day (QID) | ORAL | Status: DC | PRN
  Administered 2018-12-23: 650 mg via ORAL
  Filled 2018-12-22 (×2): qty 2

## 2018-12-22 MED ORDER — ASPIRIN EC 325 MG PO TBEC: 325.0000 mg | DELAYED_RELEASE_TABLET | Freq: Two times a day (BID) | ORAL | 0 refills | Status: DC

## 2018-12-22 MED ORDER — MAGNESIUM CITRATE PO SOLN: 1.0000 | Freq: Once | ORAL | Status: DC | PRN

## 2018-12-22 MED ORDER — ONDANSETRON HCL 4 MG/2ML IJ SOLN
INTRAMUSCULAR | Status: DC | PRN
  Administered 2018-12-22: 4 mg via INTRAVENOUS

## 2018-12-22 MED ORDER — ROSUVASTATIN CALCIUM 5 MG PO TABS
5.0000 mg | ORAL_TABLET | Freq: Every day | ORAL | Status: DC
  Administered 2018-12-22 – 2018-12-23 (×2): 5 mg via ORAL
  Filled 2018-12-22 (×3): qty 1

## 2018-12-22 MED ORDER — PROPOFOL 10 MG/ML IV BOLUS
INTRAVENOUS | Status: DC | PRN
  Administered 2018-12-22: 30 mg via INTRAVENOUS

## 2018-12-22 MED ORDER — DOCUSATE SODIUM 100 MG PO CAPS: 100.0000 mg | ORAL_CAPSULE | Freq: Two times a day (BID) | ORAL | 0 refills | Status: DC

## 2018-12-22 MED ORDER — TIZANIDINE HCL 2 MG PO TABS: 2.0000 mg | ORAL_TABLET | Freq: Three times a day (TID) | ORAL | 0 refills | Status: DC | PRN

## 2018-12-22 MED ORDER — METHOCARBAMOL 500 MG IVPB - SIMPLE MED
500.0000 mg | Freq: Four times a day (QID) | INTRAVENOUS | Status: DC | PRN
  Administered 2018-12-22: 500 mg via INTRAVENOUS
  Filled 2018-12-22: qty 50

## 2018-12-22 MED ORDER — OXYCODONE HCL 5 MG/5ML PO SOLN
5.0000 mg | Freq: Once | ORAL | Status: DC | PRN
  Filled 2018-12-22: qty 5

## 2018-12-22 MED ORDER — LACTATED RINGERS IV SOLN
INTRAVENOUS | Status: DC
  Administered 2018-12-22 (×2): via INTRAVENOUS

## 2018-12-22 MED ORDER — DIPHENHYDRAMINE HCL 12.5 MG/5ML PO ELIX: 12.5000 mg | ORAL_SOLUTION | ORAL | Status: DC | PRN

## 2018-12-22 MED ORDER — BUPIVACAINE IN DEXTROSE 0.75-8.25 % IT SOLN
INTRATHECAL | Status: DC | PRN
  Administered 2018-12-22: 1.8 mL via INTRATHECAL

## 2018-12-22 MED ORDER — VITAMIN K (PHYTONADIONE) 100 MCG PO TABS: 100.0000 ug | ORAL_TABLET | Freq: Every day | ORAL | Status: DC

## 2018-12-22 MED ORDER — OXYCODONE HCL 5 MG/5ML PO SOLN: 5.0000 mg | Freq: Once | ORAL | Status: DC | PRN

## 2018-12-22 MED ORDER — BUPIVACAINE-EPINEPHRINE 0.25% -1:200000 IJ SOLN
INTRAMUSCULAR | Status: DC | PRN
  Administered 2018-12-22: 30 mL

## 2018-12-22 MED ORDER — GABAPENTIN 300 MG PO CAPS
300.0000 mg | ORAL_CAPSULE | Freq: Two times a day (BID) | ORAL | Status: DC
  Administered 2018-12-22 – 2018-12-23 (×2): 300 mg via ORAL
  Filled 2018-12-22 (×2): qty 1

## 2018-12-22 SURGICAL SUPPLY — 57 items
ATTUNE MED DOME PAT 41 KNEE (Knees) ×1 IMPLANT
ATTUNE MED DOME PAT 41MM KNEE (Knees) ×1 IMPLANT
ATTUNE PS FEM LT SZ 5 CEM KNEE (Femur) ×2 IMPLANT
ATTUNE PSRP INSE SZ 5 7MM KNEE (Insert) ×1 IMPLANT
ATTUNE PSRP INSE SZ5 7 KNEE (Insert) ×1 IMPLANT
BAG ZIPLOCK 12X15 (MISCELLANEOUS) ×3 IMPLANT
BANDAGE ACE 6X5 VEL STRL LF (GAUZE/BANDAGES/DRESSINGS) ×3 IMPLANT
BASE TIBIAL ROT PLAT SZ 7 KNEE (Knees) IMPLANT
BENZOIN TINCTURE PRP APPL 2/3 (GAUZE/BANDAGES/DRESSINGS) ×3 IMPLANT
BLADE SAGITTAL 25.0X1.19X90 (BLADE) ×2 IMPLANT
BLADE SAGITTAL 25.0X1.19X90MM (BLADE) ×1
BLADE SAW SGTL 11.0X1.19X90.0M (BLADE) ×3 IMPLANT
BLADE SURG SZ10 CARB STEEL (BLADE) ×6 IMPLANT
BOOTIES KNEE HIGH SLOAN (MISCELLANEOUS) ×3 IMPLANT
BOWL SMART MIX CTS (DISPOSABLE) ×3 IMPLANT
CEMENT HV SMART SET (Cement) ×6 IMPLANT
CLOSURE WOUND 1/2 X4 (GAUZE/BANDAGES/DRESSINGS)
COVER WAND RF STERILE (DRAPES) IMPLANT
CUFF TOURN SGL QUICK 34 (TOURNIQUET CUFF) ×2
CUFF TRNQT CYL 34X4X40X1 (TOURNIQUET CUFF) ×1 IMPLANT
DECANTER SPIKE VIAL GLASS SM (MISCELLANEOUS) IMPLANT
DRAPE U-SHAPE 47X51 STRL (DRAPES) ×3 IMPLANT
DRSG AQUACEL AG ADV 3.5X10 (GAUZE/BANDAGES/DRESSINGS) ×3 IMPLANT
DURAPREP 26ML APPLICATOR (WOUND CARE) ×3 IMPLANT
ELECT PENCIL ROCKER SW 15FT (MISCELLANEOUS) ×2 IMPLANT
ELECT REM PT RETURN 15FT ADLT (MISCELLANEOUS) ×3 IMPLANT
GLOVE BIOGEL PI IND STRL 8 (GLOVE) ×2 IMPLANT
GLOVE BIOGEL PI INDICATOR 8 (GLOVE) ×4
GLOVE ECLIPSE 7.5 STRL STRAW (GLOVE) ×6 IMPLANT
GOWN STRL REUS W/TWL XL LVL3 (GOWN DISPOSABLE) ×6 IMPLANT
HANDPIECE INTERPULSE COAX TIP (DISPOSABLE) ×2
HOLDER FOLEY CATH W/STRAP (MISCELLANEOUS) IMPLANT
HOOD PEEL AWAY FLYTE STAYCOOL (MISCELLANEOUS) ×9 IMPLANT
IMMOBILIZER KNEE 20 (SOFTGOODS) ×3
IMMOBILIZER KNEE 20 THIGH 36 (SOFTGOODS) ×1 IMPLANT
MANIFOLD NEPTUNE II (INSTRUMENTS) ×3 IMPLANT
NEEDLE HYPO 22GX1.5 SAFETY (NEEDLE) ×3 IMPLANT
NS IRRIG 1000ML POUR BTL (IV SOLUTION) ×3 IMPLANT
PACK ICE MAXI GEL EZY WRAP (MISCELLANEOUS) ×3 IMPLANT
PACK TOTAL KNEE CUSTOM (KITS) ×3 IMPLANT
PADDING CAST COTTON 6X4 STRL (CAST SUPPLIES) ×3 IMPLANT
PIN STEINMAN FIXATION KNEE (PIN) ×2 IMPLANT
PIN THREADED HEADED SIGMA (PIN) ×2 IMPLANT
PROTECTOR NERVE ULNAR (MISCELLANEOUS) ×3 IMPLANT
SET HNDPC FAN SPRY TIP SCT (DISPOSABLE) ×1 IMPLANT
STAPLER VISISTAT 35W (STAPLE) IMPLANT
STRIP CLOSURE SKIN 1/2X4 (GAUZE/BANDAGES/DRESSINGS) IMPLANT
SUT MNCRL AB 3-0 PS2 18 (SUTURE) ×3 IMPLANT
SUT VIC AB 0 CT1 36 (SUTURE) ×3 IMPLANT
SUT VIC AB 1 CT1 36 (SUTURE) ×6 IMPLANT
SUT VIC AB 2-0 CT1 27 (SUTURE) ×4
SUT VIC AB 2-0 CT1 TAPERPNT 27 (SUTURE) ×2 IMPLANT
SYR CONTROL 10ML LL (SYRINGE) ×6 IMPLANT
TIBIAL BASE ROT PLAT SZ 7 KNEE (Knees) ×3 IMPLANT
TRAY FOLEY MTR SLVR 16FR STAT (SET/KITS/TRAYS/PACK) ×3 IMPLANT
WATER STERILE IRR 1000ML POUR (IV SOLUTION) ×6 IMPLANT
YANKAUER SUCT BULB TIP 10FT TU (MISCELLANEOUS) ×3 IMPLANT

## 2018-12-22 NOTE — Anesthesia Postprocedure Evaluation (Signed)
Anesthesia Post Note  Patient: James Tate  Procedure(s) Performed: TOTAL KNEE ARTHROPLASTY (Left Knee)     Patient location during evaluation: PACU Anesthesia Type: Spinal Level of consciousness: oriented and awake and alert Pain management: pain level controlled Vital Signs Assessment: post-procedure vital signs reviewed and stable Respiratory status: spontaneous breathing, respiratory function stable, nonlabored ventilation and patient connected to nasal cannula oxygen Cardiovascular status: blood pressure returned to baseline and stable Postop Assessment: no headache, no backache, no apparent nausea or vomiting and spinal receding Anesthetic complications: no    Last Vitals:  Vitals:   12/22/18 1257 12/22/18 1300  BP: 111/70 111/70  Pulse:  61  Resp:  15  Temp: 36.4 C   SpO2:  97%    Last Pain:  Vitals:   12/22/18 1257  TempSrc:   PainSc: 0-No pain                 Lidia Collum

## 2018-12-22 NOTE — Transfer of Care (Signed)
Immediate Anesthesia Transfer of Care Note  Patient: James Tate  Procedure(s) Performed: TOTAL KNEE ARTHROPLASTY (Left Knee)  Patient Location: PACU  Anesthesia Type:Regional and Spinal  Level of Consciousness: sedated  Airway & Oxygen Therapy: Patient Spontanous Breathing and Patient connected to face mask oxygen  Post-op Assessment: Report given to RN and Post -op Vital signs reviewed and stable  Post vital signs: Reviewed and stable  Last Vitals:  Vitals Value Taken Time  BP    Temp    Pulse    Resp 15 12/22/2018 12:00 PM  SpO2    Vitals shown include unvalidated device data.  Last Pain:  Vitals:   12/22/18 0822  TempSrc: Oral  PainSc: 1       Patients Stated Pain Goal: 3 (36/64/40 3474)  Complications: No apparent anesthesia complications

## 2018-12-22 NOTE — Care Plan (Signed)
Ortho Bundle Case Management Note  Patient Details  Name: James Tate MRN: [id number] Date of Birth: 1946/11/09  Spoke with patient prior to surgery. Will discharge to home with wife and HHPT. Has equipment. CPM to be delivered. HHPT referral to [facility]. Will transition to OPPT at [facility]                    DME Arranged:  [facility] DME Agency:  [place] Arranged:  OT [place] Agency:  [facility] (formerly [facility]  Additional Comments: Please contact me with any questions of if this plan should need to change.  Ladell Heads,  [place] Specialist  [phone] 12/22/2018, 11:16 AM

## 2018-12-22 NOTE — H&P (Addendum)
TOTAL KNEE ADMISSION H&P  Patient is being admitted for left total knee arthroplasty.  Subjective:  Chief Complaint:left knee pain.  HPI: James Tate, 73 y.o. male, has a history of pain and functional disability in the left knee due to arthritis and has failed non-surgical conservative treatments for greater than 12 weeks to includeNSAID's and/or analgesics, corticosteriod injections, viscosupplementation injections, weight reduction as appropriate and activity modification.  Onset of symptoms was gradual, starting 8 years ago with gradually worsening course since that time. The patient noted no past surgery on the left knee(s).  Patient currently rates pain in the left knee(s) at 8 out of 10 with activity. Patient has night pain, worsening of pain with activity and weight bearing, pain that interferes with activities of daily living, pain with passive range of motion and joint swelling.  Patient has evidence of subchondral sclerosis, periarticular osteophytes and joint space narrowing by imaging studies. This patient has had Failure of all reasonable conservative care. There is no active infection.  Patient Active Problem List   Diagnosis Date Noted  . Cryptogenic stroke ([place]) 07/02/2015   Past Medical History:  Diagnosis Date  . Arthritis   . BPH (benign prostatic hyperplasia)   . Cor triatriatum   . Cryptogenic stroke ([place]) 4/16  . Flat feet   . History of colon polyps   . History of gallstones   . History of hyperparathyroidism   . Melanoma of back ([place])   . MR (mitral regurgitation) 05/16/2018   Moderate noted on ECHO  . MVP (mitral valve prolapse) 05/16/2018   Noted on ECHO  . OSA on CPAP   . Osteopenia    resolved with medical therapy (calcium and fosamax)  . PFO (patent foramen ovale)   . Sensorineural hearing loss (SNHL) of both ears   . Wears hearing aid    both    Past Surgical History:  Procedure Laterality Date  . APPLICATION OF A-CELL OF BACK N/A 12/19/2014    Procedure: APPLICATION OF A-CELL OF BACK;  Surgeon: Theodoro Kos, DO;  Location: [place];  Service: Plastics;  Laterality: N/A;  . CHOLECYSTECTOMY  1996  . COLONOSCOPY    . EP IMPLANTABLE DEVICE N/A 07/02/2015   Procedure: Loop Recorder Insertion;  Surgeon: Thompson Grayer, MD;  Location: [place] [facility];  Service: Cardiovascular;  Laterality: N/A;  . implantable loop recorder removal  12/13/2018   MDT LINQ removed in office by Dr Rayann Heman  . LASIK  2003  . MELANOMA EXCISION N/A 10/04/2014   Procedure: WIDE LOCAL EXCISION OF UPPER BACK MELANOMA ;  Surgeon: Gayland Curry, MD;  [facility];  Service: General;  Laterality: N/A;  . PARATHYROIDECTOMY  2004  . TEE WITHOUT CARDIOVERSION N/A 06/12/2015   Procedure: TRANSESOPHAGEAL ECHOCARDIOGRAM (TEE);  Surgeon: Josue Hector, MD;  Location: [facility] ENDOSCOPY;  Service: Cardiovascular;  Laterality: N/A;  . TONSILLECTOMY      Current Facility-Administered Medications  Medication Dose Route Frequency Provider Last Rate Last Dose  . bupivacaine liposome (EXPAREL) 1.3 % injection 266 mg  20 mL Infiltration Once Dorna Leitz, MD       Current Outpatient Medications  Medication Sig Dispense Refill Last Dose  . acetaminophen (TYLENOL) 325 MG tablet Take 1-2 tablets by mouth daily as needed (for pain).    Taking  . aspirin 81 MG tablet Take 81 mg by mouth daily.   Taking  . Cholecalciferol (VITAMIN D PO) Take 1 tablet by mouth every morning.  Taking  . Cyanocobalamin (VITAMIN B-12 PO) Take 1 tablet by mouth every morning.   Taking  . Miconazole Nitrate 2 % OINT Apply 1 application topically daily as needed.   Taking  . Multiple Minerals-Vitamins (CAL-MAG-ZINC-D PO) Take 1 tablet by mouth daily.   Taking  . Multiple Vitamin (MULTIVITAMIN) capsule Take 1 capsule by mouth daily.   Taking  . Omega-3 Fatty Acids (OMEGA 3 PO) Take 1 tablet by mouth every morning.   Taking  . rosuvastatin (CRESTOR) 5 MG tablet Take 5 mg by mouth daily.   Taking   . terbinafine (LAMISIL) 250 MG tablet Crush 2 tablets in fungi-nail solution and apply once daily to affected toenails   Taking  . triamcinolone (KENALOG) 0.1 % paste Use as directed 1 application in the mouth or throat 2 (two) times daily as needed (mouth sores).   Taking  . Vitamin K, Phytonadione, 100 MCG TABS Take 100 mcg by mouth daily.    Taking  . tadalafil (CIALIS) 20 MG tablet Take 20 mg by mouth daily as needed for erectile dysfunction.   Taking   Allergies  Allergen Reactions  . Codeine Other (See Comments)    Headache.   . Erythromycin     Childhood allergy - unknown    Social History   Tobacco Use  . Smoking status: Former Smoker    Last attempt to quit: 09/27/1971    Years since quitting: 47.2  . Smokeless tobacco: Never Used  Substance Use Topics  . Alcohol use: Yes    Comment: OCCASIONAL    Family History  Problem Relation Age of Onset  . Heart Problems Mother        valve replacement & pacemaker  . Osteoporosis Father   . Other Maternal Grandfather        poss MI, per pt     ROS ROS: I have reviewed the patient's review of systems thoroughly and there are no positive responses as relates to the HPI. Objective:  Physical Exam  Vital signs in last 24 hours:    Vitals:   12/22/18 0931 12/22/18 0932  BP:    Pulse: 66 65  Resp: 11 13  Temp:    SpO2: 93% 93%   Well-developed well-nourished patient in no acute distress. Alert and oriented x3 HEENT:within normal limits Cardiac: Regular rate and rhythm Pulmonary: Lungs clear to auscultation Abdomen: Soft and nontender.  Normal active bowel sounds  Musculoskeletal: (Left knee: Limited range of motion.  Painful range of motion.  No instability.  Trace effusion.) Labs: Recent Results (from the past 2160 hour(s))  CUP PACEART REMOTE DEVICE CHECK     Status: None   Collection Time: 10/10/18 12:41 AM  Result Value Ref Range   Date Time Interrogation Session [id number]    Pulse Generator  Manufacturer MERM    Pulse Gen Model [id number] Reveal LINQ    Pulse Gen Serial Number [id number]    Clinic Name [facility]    Implantable Pulse Generator Type ICM/ILR    Implantable Pulse Generator Implant Date [id number]   APTT     Status: None   Collection Time: 12/18/18  2:30 PM  Result Value Ref Range   aPTT 31 24 - 36 seconds    Comment: Performed at [facility], [place] [address], [place], [place] [address]  CBC WITH DIFFERENTIAL     Status: None   Collection Time: 12/18/18  2:30 PM  Result Value Ref Range   WBC 7.8 4.0 -  10.5 K/uL   RBC 5.05 4.22 - 5.81 MIL/uL   Hemoglobin 15.3 13.0 - 17.0 g/dL   HCT 46.2 39.0 - 52.0 %   MCV 91.5 80.0 - 100.0 fL   MCH 30.3 26.0 - 34.0 pg   MCHC 33.1 30.0 - 36.0 g/dL   RDW 12.3 11.5 - 15.5 %   Platelets 247 150 - 400 K/uL   nRBC 0.0 0.0 - 0.2 %   Neutrophils Relative % 64 %   Neutro Abs 4.9 1.7 - 7.7 K/uL   Lymphocytes Relative 17 %   Lymphs Abs 1.4 0.7 - 4.0 K/uL   Monocytes Relative 13 %   Monocytes Absolute 1.0 0.1 - 1.0 K/uL   Eosinophils Relative 5 %   Eosinophils Absolute 0.4 0.0 - 0.5 K/uL   Basophils Relative 1 %   Basophils Absolute 0.1 0.0 - 0.1 K/uL   Immature Granulocytes 0 %   Abs Immature Granulocytes 0.03 0.00 - 0.07 K/uL    Comment: Performed at [facility], [place] [address] [place], [place] [address]  Comprehensive metabolic panel     Status: Abnormal   Collection Time: 12/18/18  2:30 PM  Result Value Ref Range   Sodium 141 135 - 145 mmol/L   Potassium 4.3 3.5 - 5.1 mmol/L   Chloride 103 98 - 111 mmol/L   CO2 27 22 - 32 mmol/L   Glucose, Bld 104 (H) 70 - 99 mg/dL   BUN 21 8 - 23 mg/dL   Creatinine, Ser 0.86 0.61 - 1.24 mg/dL   Calcium 9.4 8.9 - 10.3 mg/dL   Total Protein 7.5 6.5 - 8.1 g/dL   Albumin 4.5 3.5 - 5.0 g/dL   AST 32 15 - 41 U/L   ALT 30 0 - 44 U/L   Alkaline Phosphatase 53 38 - 126 U/L   Total Bilirubin 1.9 (H) 0.3 - 1.2 mg/dL   GFR calc non Af Amer >60 >60  mL/min   GFR calc Af Amer >60 >60 mL/min   Anion gap 11 5 - 15    Comment: Performed at [facility], [place] [address], [place], [place] [address]  Protime-INR     Status: None   Collection Time: 12/18/18  2:30 PM  Result Value Ref Range   Prothrombin Time 13.4 11.4 - 15.2 seconds   INR 1.03     Comment: Performed at [facility], [place] [address] [place], [place] [address]  Urinalysis, Routine w reflex microscopic     Status: Abnormal   Collection Time: 12/18/18  2:30 PM  Result Value Ref Range   Color, Urine YELLOW YELLOW   APPearance CLEAR CLEAR   Specific Gravity, Urine 1.021 1.005 - 1.030   pH 8.0 5.0 - 8.0   Glucose, UA NEGATIVE NEGATIVE mg/dL   Hgb urine dipstick NEGATIVE NEGATIVE   Bilirubin Urine NEGATIVE NEGATIVE   Ketones, ur NEGATIVE NEGATIVE mg/dL   Protein, ur 30 (A) NEGATIVE mg/dL   Nitrite NEGATIVE NEGATIVE   Leukocytes, UA NEGATIVE NEGATIVE   RBC / HPF 0-5 0 - 5 RBC/hpf   WBC, UA 0-5 0 - 5 WBC/hpf   Bacteria, UA NONE SEEN NONE SEEN    Comment: Performed at [facility], [place] [address], [place], [place] [address]  Type and screen Order type and screen if day of surgery is less than 15 days from draw of preadmission visit or order morning of surgery if day of surgery is greater than 6 days  from preadmission visit.     Status: None   Collection Time: 12/18/18  2:45 PM  Result Value Ref Range   ABO/RH(D) B POS    Antibody Screen NEG    Sample Expiration 12/25/2018    Extend sample reason      NO TRANSFUSIONS OR PREGNANCY IN THE PAST 3 MONTHS Performed at [facility], [place] [address] [place], [place] [address]   ABO/Rh     Status: None   Collection Time: 12/18/18  2:45 PM  Result Value Ref Range   ABO/RH(D)      B POS Performed at [facility], [place] [address] [place], [place] [address]   Surgical pcr screen     Status: Abnormal   Collection Time: 12/18/18  3:40 PM   Result Value Ref Range   MRSA, PCR NEGATIVE NEGATIVE   Staphylococcus aureus POSITIVE (A) NEGATIVE    Comment: (NOTE) The Xpert SA Assay (FDA approved for NASAL specimens in patients 45 years of age and older), is one component of a comprehensive surveillance program. It is not intended to diagnose infection nor to guide or monitor treatment. Performed at [facility], [place] [address] [place], [place] [address]     Estimated body mass index is 25.5 kg/m as calculated from the following:   Height as of 12/18/18: 6' (1.829 m).   Weight as of 12/18/18: 85.3 kg.   Imaging Review Plain radiographs demonstrate severe degenerative joint disease of the left knee(s). The overall alignment ismild varus. The bone quality appears to be fair for age and reported activity level.   Preoperative templating of the joint replacement has been completed, documented, and submitted to the Operating Room personnel in order to optimize intra-operative equipment management.    Patient's anticipated LOS is less than 2 midnights, meeting these requirements: - Younger than 59 - Lives within 1 hour of care - Has a competent adult at home to recover with post-op recover - NO history of  - Chronic pain requiring opiods  - Diabetes  - Coronary Artery Disease  - Heart failure  - Heart attack  - Stroke  - DVT/VTE  - Cardiac arrhythmia  - Respiratory Failure/COPD  - Renal failure  - Anemia  - Advanced Liver disease        Assessment/Plan:  End stage arthritis, left knee   The patient history, physical examination, clinical judgment of the provider and imaging studies are consistent with end stage degenerative joint disease of the left knee(s) and total knee arthroplasty is deemed medically necessary. The treatment options including medical management, injection therapy arthroscopy and arthroplasty were discussed at length. The risks and benefits of total knee arthroplasty were  presented and reviewed. The risks due to aseptic loosening, infection, stiffness, patella tracking problems, thromboembolic complications and other imponderables were discussed. The patient acknowledged the explanation, agreed to proceed with the plan and consent was signed. Patient is being admitted for inpatient treatment for surgery, pain control, PT, OT, prophylactic antibiotics, VTE prophylaxis, progressive ambulation and ADL's and discharge planning. The patient is planning to be discharged home with home health services

## 2018-12-22 NOTE — Progress Notes (Signed)
Assisted Dr. Christella Hartigan with left, ultrasound guided, adductor canal block. Side rails up, monitors on throughout procedure. See vital signs in flow sheet. Tolerated Procedure well.

## 2018-12-22 NOTE — Evaluation (Signed)
Physical Therapy Evaluation Patient Details Name: James Tate MRN: [id number] DOB: 11/30/1946 Today's Date: 12/22/2018   History of Present Illness  Pt s/p L TKR and with hx of MVP and cryptogenic stroke.  Clinical Impression  Pt s.p L TKR and presents with decreased L LE strength/ROM and post op pain limiting functional mobility.  Pt should progress to dc home with family assist.    Follow Up Recommendations Follow surgeon's recommendation for DC plan and follow-up therapies    Equipment Recommendations  None recommended by PT    Recommendations for Other Services       Precautions / Restrictions Precautions Precautions: Knee;Fall Required Braces or Orthoses: Knee Immobilizer - Left Knee Immobilizer - Left: Discontinue once straight leg raise with < 10 degree lag Restrictions Weight Bearing Restrictions: No Other Position/Activity Restrictions: WBAT      Mobility  Bed Mobility Overal bed mobility: Needs Assistance Bed Mobility: Supine to Sit     Supine to sit: Min assist     General bed mobility comments: cues for sequence and use of R LE to self assist  Transfers Overall transfer level: Needs assistance Equipment used: Rolling walker (2 wheeled) Transfers: Sit to/from Stand Sit to Stand: Min assist         General transfer comment: cues for LE management and use of UEs to self assist  Ambulation/Gait Ambulation/Gait assistance: Min assist Gait Distance (Feet): 70 Feet Assistive device: Rolling walker (2 wheeled) Gait Pattern/deviations: Step-to pattern;Decreased step length - right;Decreased step length - left;Shuffle;Trunk flexed Gait velocity: decr   General Gait Details: cues for sequence, posture and position from [employer]            Wheelchair Mobility    Modified Rankin (Stroke Patients Only)       Balance Overall balance assessment: Mild deficits observed, not formally tested                                           Pertinent Vitals/Pain Pain Assessment: 0-10 Pain Score: 3  Pain Location: L knee Pain Descriptors / Indicators: Aching;Sore Pain Intervention(s): Limited activity within patient's tolerance;Premedicated before session;Monitored during session;Ice applied    Home Living Family/patient expects to be discharged to:: Private residence Living Arrangements: Spouse/significant other Available Help at Discharge: Family Type of Home: House Home Access: Stairs to enter Entrance Stairs-Rails: Right;Can reach Software engineer of Steps: 4 Home Layout: One level Home Equipment: Environmental consultant - 2 wheels;Cane - single point;Bedside commode      Prior Function Level of Independence: Independent;Independent with assistive device(s)         Comments: cane for community ambulation     Hand Dominance        Extremity/Trunk Assessment   Upper Extremity Assessment Upper Extremity Assessment: Overall WFL for tasks assessed    Lower Extremity Assessment Lower Extremity Assessment: LLE deficits/detail    Cervical / Trunk Assessment Cervical / Trunk Assessment: Normal  Communication   Communication: No difficulties  Cognition Arousal/Alertness: Awake/alert Behavior During Therapy: WFL for tasks assessed/performed Overall Cognitive Status: Within Functional Limits for tasks assessed                                        General Comments      Exercises Total Joint Exercises  Ankle Circles/Pumps: AROM;Both;20 reps;Supine   Assessment/Plan    PT Assessment Patient needs continued PT services  PT Problem List Decreased strength;Decreased range of motion;Decreased activity tolerance;Decreased mobility;Decreased knowledge of use of DME;Pain       PT Treatment Interventions DME instruction;Gait training;Stair training;Functional mobility training;Therapeutic activities;Therapeutic exercise;Patient/family education    PT Goals (Current goals can be  found in the Care Plan section)  Acute Rehab PT Goals Patient Stated Goal: Regain IND PT Goal Formulation: With patient Time For Goal Achievement: 12/29/18 Potential to Achieve Goals: Good    Frequency 7X/week   Barriers to discharge        Co-evaluation               AM-PAC PT "6 Clicks" Mobility  Outcome Measure Help needed turning from your back to your side while in a flat bed without using bedrails?: A Little Help needed moving from lying on your back to sitting on the side of a flat bed without using bedrails?: A Little Help needed moving to and from a bed to a chair (including a wheelchair)?: A Little Help needed standing up from a chair using your arms (e.g., wheelchair or bedside chair)?: A Little Help needed to walk in hospital room?: A Little Help needed climbing 3-5 steps with a railing? : A Little 6 Click Score: 18    End of Session Equipment Utilized During Treatment: Gait belt;Left knee immobilizer Activity Tolerance: Patient tolerated treatment well Patient left: in chair;with call bell/phone within reach;with family/visitor present Nurse Communication: Mobility status PT Visit Diagnosis: Difficulty in walking, not elsewhere classified (R26.2)    Time: 7867-6720 PT Time Calculation (min) (ACUTE ONLY): 31 min   Charges:   PT Evaluation $PT Eval Low Complexity: 1 Low PT Treatments $Gait Training: 8-22 mins        [place] Pager [phone] Office [phone]   Dwayne Bulkley 12/22/2018, 5:28 PM

## 2018-12-22 NOTE — Op Note (Signed)
NAME: James Tate, [place]E MEDICAL RECORD [id number] ACCOUNT [id number] DATE OF BIRTH:08-Feb-1946 FACILITY: [facility] LOCATION: [facility] PHYSICIAN:Nori Poland L. Taniela Feltus, MD  OPERATIVE REPORT  DATE OF PROCEDURE:  12/22/2018  PREOPERATIVE DIAGNOSIS:  End-stage degenerative joint disease, left knee.  POSTOPERATIVE DIAGNOSIS:  End-stage degenerative joint disease, left knee.  PROCEDURE:  Left total knee replacement with an Attune system, size 5 femur, size 7 tibia, 7 mm bridging bearing, and a 41 mm all polyethylene patella.  SURGEON:  Dorna Leitz, MD  ASSISTANT:  Gaspar Skeeters, PA-C, was present for the entire case and assisted with retraction, bone cuts, and closing to minimize OR time.  BRIEF HISTORY:  The patient is a 73 year old male with a long history of significant complaints of left knee pain.  He had been treated conservatively for a prolonged period of time.  After failure of all conservative care, x-rays showing bone-on-bone  change in a patient who is having night pain and light activity pain is taken to the operating room for left total knee replacement.  DESCRIPTION OF PROCEDURE:  The patient was taken to the operating room after adequate anesthesia was obtained with a spinal anesthetic.  The patient was placed on the operating table.  Left leg was prepped and draped in usual sterile fashion.  Following  this, the leg was exsanguinated, blood pressure tourniquet inflated to 300 mmHg.  Following this, a midline incision was made.  Subcutaneous dissected down to the level of extensor mechanism.  A medial parapatellar arthrotomy was undertaken.  Following  this, attention was turned to the left knee where medial and lateral menisci were removed, retropatellar fat pad, synovium on the anterior aspect of the femur, and the anterior and posterior cruciates.  Following this, an intramedullary hole was drilled  followed by introduction of an alignment rod with a 4-degree valgus inclination cut made  with 9 mm of distal bone.  Following this, attention was turned towards sizing the femur.  It sized to a 5, and anterior and posterior cuts were made chamfers and  box.  Attention was then turned to the tibia.  It was cut perpendicular to its long axis with 30-degree posterior slope, and it sized to a 7.  It was drilled and keeled at this point.  Trials were put in place.  Excellent range of motion and stability  were achieved with a 7 poly insert.  Attention was turned to the patella, and 9.5 mm of bone was resected and the 41 paddle was chosen and lugs were drilled.  Following this, attention was turned towards cementing in the final components.  The final  components were cemented into place, size 7 tibia, size 5 femur, 7 mm bridging bearing trial was placed, and a 41 patella was placed and held with a clamp.  This was all cemented in after the knee was copiously and thoroughly irrigated with pulsatile  lavage irrigation and suctioned completely dry.  When the cement was allowed to completely harden, the tourniquet was let down.  All bleeders were controlled with electrocautery.  All excess bone cement had been removed.  Attention was turned towards  placement of the trial 7.  Actually, trialed with an 8 just to see, but that was too tight in extension.  At this point, we went to the 7.  It was opened and placed.  Medial parapatellar arthrotomy was closed with #1 Vicryl running, skin was closed with  0 and 2-0 Vicryl and 3-0 Monocryl subcuticular.  Benzoin and Steri-Strips were applied.  Sterile  compressive dressing was applied.  The patient was taken to recovery and was noted to be in satisfactory condition.  Estimated blood loss for the procedure  was minimal.  LN/NUANCE  D:12/22/2018 T:12/22/2018 JOB:[id number]

## 2018-12-22 NOTE — Discharge Instructions (Signed)

## 2018-12-22 NOTE — Brief Op Note (Signed)
12/22/2018  11:36 AM  PATIENT:  James Tate  73 y.o. male  PRE-OPERATIVE DIAGNOSIS:  LEFT KNEE OSTEOARTHRITIS  POST-OPERATIVE DIAGNOSIS:  LEFT KNEE OSTEOARTHRITIS  PROCEDURE:  Procedure(s): TOTAL KNEE ARTHROPLASTY (Left)  SURGEON:  Surgeon(s) and Role:    Dorna Leitz, MD - Primary  PHYSICIAN ASSISTANT:   ASSISTANTS: bethune   ANESTHESIA:   spinal  EBL:  100 mL   BLOOD ADMINISTERED:none  DRAINS: none   LOCAL MEDICATIONS USED:  MARCAINE    and OTHER experel  SPECIMEN:  No Specimen  DISPOSITION OF SPECIMEN:  N/A  COUNTS:  YES  TOURNIQUET:   Total Tourniquet Time Documented: Thigh (Left) - 55 minutes Total: Thigh (Left) - 55 minutes   DICTATION: .Other Dictation: Dictation Number [id number]  PLAN OF CARE: Admit for overnight observation  PATIENT DISPOSITION:  PACU - hemodynamically stable.   Delay start of Pharmacological VTE agent (>24hrs) due to surgical blood loss or risk of bleeding: no

## 2018-12-22 NOTE — Anesthesia Procedure Notes (Signed)
Spinal  Patient location during procedure: OR Start time: 12/22/2018 10:07 AM Staffing Resident/CRNA: [place] Danea Manter C, CRNA Performed: resident/CRNA  Preanesthetic Checklist Completed: patient identified, site marked, surgical consent, pre-op evaluation, timeout performed, IV checked, risks and benefits discussed and monitors and equipment checked Spinal Block Patient position: sitting Prep: site prepped and draped and DuraPrep Patient monitoring: heart rate, cardiac monitor and continuous pulse ox Approach: midline Location: L3-4 Injection technique: single-shot Needle Needle type: Pencan  Needle gauge: 24 G Assessment Sensory level: T6 Additional Notes Expiration date and kit checked.  Time out performed.  Pt tolerated well.  CSF aspirated before and after injection

## 2018-12-22 NOTE — Anesthesia Procedure Notes (Signed)
Anesthesia Regional Block: Adductor canal block   Pre-Anesthetic Checklist: ,, timeout performed, Correct Patient, Correct Site, Correct Laterality, Correct Procedure, Correct Position, site marked, Risks and benefits discussed,  Surgical consent,  Pre-op evaluation,  At surgeon's request and post-op pain management  Laterality: Left  Prep: chloraprep       Needles:  Injection technique: Single-shot  Needle Type: Echogenic Stimulator Needle     Needle Length: 9cm  Needle Gauge: 21     Additional Needles:   Procedures:,,,, ultrasound used (permanent image in chart),,,,  Narrative:  Start time: 12/22/2018 9:25 AM End time: 12/22/2018 9:30 AM Injection made incrementally with aspirations every 5 mL.  Performed by: Personally  Anesthesiologist: Lidia Collum, MD  Additional Notes: Monitors applied. Injection made in 5cc increments. No resistance to injection. Good needle visualization. Patient tolerated procedure well.

## 2018-12-22 NOTE — Care Management Note (Signed)
Case Management Note  Patient Details  Name: James Tate MRN: [id number] Date of Birth: [birth year]  Subjective/Objective:                    Action/Plan:Pt is a Bundle, Home with [facility], [place]. Pt has equipment at home from [place].   Expected Discharge Date:                  Expected Discharge Plan:  [place]  In-House Referral:     Discharge planning Services  CM Consult  Post Acute Care Choice:  Home Health, Durable Medical Equipment Choice offered to:  Patient  DME Arranged:  CPM DME Agency:  [facility]  HH Arranged:  OT HH Agency:  [facility] (formerly [employer])  Status of Service:  Completed, signed off  If discussed at [employer], dates discussed:    Additional CommentsPurcell Mouton, RN 12/22/2018, 2:22 PM

## 2018-12-23 DIAGNOSIS — Z9989 Dependence on other enabling machines and devices: Secondary | ICD-10-CM | POA: Diagnosis not present

## 2018-12-23 DIAGNOSIS — G4733 Obstructive sleep apnea (adult) (pediatric): Secondary | ICD-10-CM | POA: Diagnosis not present

## 2018-12-23 DIAGNOSIS — M1712 Unilateral primary osteoarthritis, left knee: Secondary | ICD-10-CM | POA: Diagnosis not present

## 2018-12-23 DIAGNOSIS — Z85820 Personal history of malignant melanoma of skin: Secondary | ICD-10-CM | POA: Diagnosis not present

## 2018-12-23 DIAGNOSIS — Z8673 Personal history of transient ischemic attack (TIA), 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 residual deficits: Secondary | ICD-10-CM | POA: Diagnosis not present

## 2018-12-23 DIAGNOSIS — Z7982 Long term (current) use of aspirin: Secondary | ICD-10-CM | POA: Diagnosis not present

## 2018-12-23 LAB — CBC
HEMATOCRIT: 39 % (ref 39.0–52.0)
Hemoglobin: 12.9 g/dL — ABNORMAL LOW (ref 13.0–17.0)
MCH: 30.6 pg (ref 26.0–34.0)
MCHC: 33.1 g/dL (ref 30.0–36.0)
MCV: 92.4 fL (ref 80.0–100.0)
Platelets: 222 10*3/uL (ref 150–400)
RBC: 4.22 MIL/uL (ref 4.22–5.81)
RDW: 12.1 % (ref 11.5–15.5)
WBC: 16.2 10*3/uL — ABNORMAL HIGH (ref 4.0–10.5)
nRBC: 0 % (ref 0.0–0.2)

## 2018-12-23 NOTE — Progress Notes (Signed)
The pt was provided with d/c instructions and prescriptions. After discussing the pt's plan of care upon d/c home, the pt reported no further questions or concerns.  

## 2018-12-23 NOTE — Care Plan (Signed)
Ortho Bundle Case Management Note  Patient Details  Name: James Tate MRN: [id number] Date of Birth: June 15, 1946    Patient is going home with HHPT not OT as previously documented                  DME Arranged:  CPM DME Agency:  [facility]  HH Arranged:  PT [place] Agency:  [facility] ([facility])  Additional Comments: Please contact me with any questions of if this plan should need to change.  Ladell Heads,  [place] [facility]opaedic Specialist  [phone] 12/23/2018, 9:13 AM

## 2018-12-23 NOTE — Progress Notes (Signed)
Pt has completed PT, but is DTV. Once the pt voids an adequate amount, the pt can be d/c.

## 2018-12-23 NOTE — Progress Notes (Signed)
At 1008, Cookie, the case manager was notified of the code 3.

## 2018-12-23 NOTE — Progress Notes (Signed)
Physical Therapy Treatment Patient Details Name: James Tate MRN: [id number] DOB: 05-Feb-1946 Today's Date: 12/23/2018    History of Present Illness Pt s/p L TKR and with hx of MVP and cryptogenic stroke.    PT Comments    Pt progressing extremely well with mobility and ROM.  Pt and spouse reviewed stairs and home therex program.   Follow Up Recommendations  Follow surgeon's recommendation for DC plan and follow-up therapies     Equipment Recommendations  None recommended by PT    Recommendations for Other Services       Precautions / Restrictions Precautions Precautions: Knee;Fall Required Braces or Orthoses: Knee Immobilizer - Left Knee Immobilizer - Left: Discontinue once straight leg raise with < 10 degree lag(Pt performed IND SLR this am) Restrictions Weight Bearing Restrictions: No Other Position/Activity Restrictions: WBAT    Mobility  Bed Mobility Overal bed mobility: Needs Assistance Bed Mobility: Supine to Sit;Sit to Supine     Supine to sit: Min guard Sit to supine: Supervision   General bed mobility comments: cues for sequence and use of R LE to self assist  Transfers Overall transfer level: Needs assistance Equipment used: Rolling walker (2 wheeled) Transfers: Sit to/from Stand Sit to Stand: Min guard;Supervision         General transfer comment: cues for LE management and use of UEs to self assist  Ambulation/Gait Ambulation/Gait assistance: Min guard;Supervision Gait Distance (Feet): 150 Feet Assistive device: Rolling walker (2 wheeled) Gait Pattern/deviations: Decreased step length - right;Decreased step length - left;Shuffle;Trunk flexed;Step-to pattern;Step-through pattern Gait velocity: decr   General Gait Details: cues for sequence, posture and position from RW   Stairs Stairs: Yes Stairs assistance: Min guard Stair Management: Two rails;Step to pattern;Forwards Number of Stairs: 5 General stair comments: min cues for  sequence   Wheelchair Mobility    Modified Rankin (Stroke Patients Only)       Balance Overall balance assessment: Mild deficits observed, not formally tested                                          Cognition Arousal/Alertness: Awake/alert Behavior During Therapy: WFL for tasks assessed/performed Overall Cognitive Status: Within Functional Limits for tasks assessed                                        Exercises Total Joint Exercises Ankle Circles/Pumps: AROM;Both;20 reps;Supine Quad Sets: AROM;Both;10 reps;Supine Heel Slides: AAROM;Left;15 reps;Supine Straight Leg Raises: AAROM;AROM;Left;15 reps;Supine Long Arc Quad: AROM;Left;10 reps;Seated Goniometric ROM: AAROM L knee -5 - 95    General Comments        Pertinent Vitals/Pain Pain Assessment: 0-10 Pain Score: 3  Pain Location: L knee Pain Descriptors / Indicators: Aching;Sore Pain Intervention(s): Limited activity within patient's tolerance;Monitored during session;Premedicated before session;Ice applied    Home Living                      Prior Function            PT Goals (current goals can now be found in the care plan section) Acute Rehab PT Goals Patient Stated Goal: Regain IND PT Goal Formulation: With patient Time For Goal Achievement: 12/29/18 Potential to Achieve Goals: Good Progress towards PT goals: Progressing toward goals    Frequency  7X/week      PT Plan Current plan remains appropriate    Co-evaluation              AM-PAC PT "6 Clicks" Mobility   Outcome Measure  Help needed turning from your back to your side while in a flat bed without using bedrails?: A Little Help needed moving from lying on your back to sitting on the side of a flat bed without using bedrails?: A Little Help needed moving to and from a bed to a chair (including a wheelchair)?: A Little Help needed standing up from a chair using your arms (e.g.,  wheelchair or bedside chair)?: A Little Help needed to walk in hospital room?: A Little Help needed climbing 3-5 steps with a railing? : A Little 6 Click Score: 18    End of Session Equipment Utilized During Treatment: Gait belt Activity Tolerance: Patient tolerated treatment well Patient left: in bed;with call bell/phone within reach;with family/visitor present Nurse Communication: Mobility status PT Visit Diagnosis: Difficulty in walking, not elsewhere classified (R26.2)     Time: 8676-7209 PT Time Calculation (min) (ACUTE ONLY): 33 min  Charges:  $Gait Training: 8-22 mins $Therapeutic Exercise: 8-22 mins                     [place] Pager [phone] Office [phone]    Azul Coffie 12/23/2018, 11:12 AM

## 2018-12-23 NOTE — Care Management CC44 (Signed)
Condition Code 44 Documentation Completed  Patient Details  Name: JO CERONE MRN: [id number] Date of Birth: Jan 26, 1946   Condition Code 44 given:  Yes Patient signature on Condition Code 44 notice:  Yes Documentation of 2 MD's agreement:  Yes Code 44 added to claim:  Yes    Purcell Mouton, RN 12/23/2018, 12:11 PM

## 2018-12-23 NOTE — Discharge Summary (Signed)
Physician Discharge Summary  Patient ID: James Tate MRN: [id number] DOB/AGE: Jun 03, 1946 73 y.o.  Admit date: 12/22/2018 Discharge date: 12/23/2018  Admission Diagnoses:  Primary osteoarthritis of left knee  Discharge Diagnoses:  Principal Problem:   Primary osteoarthritis of left knee   Past Medical History:  Diagnosis Date  . Arthritis   . BPH (benign prostatic hyperplasia)   . Cor triatriatum   . 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 Flat feet   . History of colon polyps   . History of gallstones   . History of hyperparathyroidism   . 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 MR (mitral regurgitation) 05/16/2018   Moderate noted on ECHO  . MVP (mitral valve prolapse) 05/16/2018   Noted on ECHO  . OSA on CPAP   . Osteopenia    resolved with medical therapy (calcium and fosamax)  . PFO (patent foramen ovale)   . Sensorineural hearing loss (SNHL) of both ears   . Wears hearing aid    both    Surgeries: Procedure(s): TOTAL KNEE ARTHROPLASTY on 12/22/2018   Consultants (if any):   Discharged Condition: Improved  Hospital Course: James Tate is an 73 y.o. male who was admitted 12/22/2018 with a diagnosis of Primary osteoarthritis of left knee and went to the operating room on 12/22/2018 and underwent the above named procedures.    He was given perioperative antibiotics:  Anti-infectives (From admission, onward)   Start     Dose/Rate Route Frequency Ordered Stop   12/22/18 1600  ceFAZolin (ANCEF) IVPB 2g/100 mL premix     2 g 200 mL/hr over 30 Minutes Intravenous Every 6 hours 12/22/18 1327 12/22/18 2254   12/22/18 0830  ceFAZolin (ANCEF) IVPB 2g/100 mL premix     2 g 200 mL/hr over 30 Minutes Intravenous On call to O.R. 12/22/18 0815 12/22/18 1008    .  He was given sequential compression devices, early ambulation, and ASA for DVT prophylaxis.  He benefited maximally from the hospital stay and there were no complications.  He was tolerating PO, pain well-controlled, dressing  clean & dry, NVI at time of d/c  Recent vital signs:  Vitals:   12/23/18 0035 12/23/18 0440  BP: 112/62 (!) 104/58  Pulse: 74 66  Resp: 16 16  Temp: 98.5 F (36.9 C) 98.1 F (36.7 C)  SpO2: 93% 95%    Recent laboratory studies:  Lab Results  Component Value Date   HGB 12.9 (L) 12/23/2018   HGB 15.3 12/18/2018   HGB 16.4 12/19/2014   Lab Results  Component Value Date   WBC 16.2 (H) 12/23/2018   PLT 222 12/23/2018   Lab Results  Component Value Date   INR 1.03 12/18/2018   Lab Results  Component Value Date   NA 141 12/18/2018   K 4.3 12/18/2018   CL 103 12/18/2018   CO2 27 12/18/2018   BUN 21 12/18/2018   CREATININE 0.86 12/18/2018   GLUCOSE 104 (H) 12/18/2018    Discharge Medications:   Allergies as of 12/23/2018      Reactions   Codeine Other (See Comments)   Headache.    Erythromycin    Childhood allergy - unknown      Medication List    STOP taking these medications   aspirin 81 MG tablet Replaced by:  aspirin EC 325 MG tablet   TYLENOL 325 MG tablet Generic drug:  acetaminophen     TAKE these medications   aspirin EC 325 MG tablet Take  1 tablet (325 mg total) by mouth 2 (two) times daily after a meal. Take x 1 month post op to decrease risk of blood clots. Replaces:  aspirin 81 MG tablet   CAL-MAG-ZINC-D PO Take 1 tablet by mouth daily.   docusate sodium 100 MG capsule Commonly known as:  COLACE Take 1 capsule (100 mg total) by mouth 2 (two) times daily.   Miconazole Nitrate 2 % Oint Apply 1 application topically daily as needed.   multivitamin capsule Take 1 capsule by mouth daily.   OMEGA 3 PO Take 1 tablet by mouth every morning.   oxyCODONE-acetaminophen 5-325 MG tablet Commonly known as:  PERCOCET/ROXICET Take 1-2 tablets by mouth every 6 (six) hours as needed for severe pain.   rosuvastatin 5 MG tablet Commonly known as:  CRESTOR Take 5 mg by mouth daily.   tadalafil 20 MG tablet Commonly known as:  ADCIRCA/CIALIS Take  20 mg by mouth daily as needed for erectile dysfunction.   terbinafine 250 MG tablet Commonly known as:  LAMISIL Crush 2 tablets in fungi-nail solution and apply once daily to affected toenails   tiZANidine 2 MG tablet Commonly known as:  ZANAFLEX Take 1 tablet (2 mg total) by mouth every 8 (eight) hours as needed for muscle spasms.   triamcinolone 0.1 % paste Commonly known as:  KENALOG Use as directed 1 application in the mouth or throat 2 (two) times daily as needed (mouth sores).   VITAMIN B-12 PO Take 1 tablet by mouth every morning.   VITAMIN D PO Take 1 tablet by mouth every morning.   Vitamin K (Phytonadione) 100 MCG Tabs Take 100 mcg by mouth daily.       Diagnostic Studies: Dg Chest 2 View  Result Date: 12/19/2018 CLINICAL DATA:  Preop. EXAM: CHEST - 2 VIEW COMPARISON:  None. FINDINGS: The heart size and mediastinal contours are within normal limits. Both lungs are clear. The visualized skeletal structures are unremarkable. IMPRESSION: No active cardiopulmonary disease. Electronically Signed   By: Marijo Conception, M.D.   On: 12/19/2018 08:16    Disposition: Discharge disposition: 01-Home or Self Care         Follow-up Information    Dorna Leitz, MD. Go on 01/01/2019.   Specialty:  Orthopedic Surgery Why:  Your appointment has been made for 10:15. Contact information: [place] [address] [phone]        [facility], [place]. Go on 01/01/2019.   Why:  You are scheduled to start outpatient physical therapy at 1200. Please go over after MD appointment to start your paperwork.  Contact information: [facility] [place] [address] [phone]        [facility] Follow up.   Specialty:  [place] Why:  You will be seen by HHPT for 5 visits prior to follow up in the office with MD  Contact information: [place] [address] [phone]             Signed: EZIO WIECK 12/23/2018, 8:32 AM

## 2018-12-23 NOTE — Progress Notes (Addendum)
At 1126, the on-call service for James Tate was called regarding the code 44 and changing the pt to observation.   Code 44 has been resolved.

## 2018-12-24 DIAGNOSIS — M2141 Flat foot [pes planus] (acquired), right foot: Secondary | ICD-10-CM | POA: Diagnosis not present

## 2018-12-24 DIAGNOSIS — Q211 Atrial septal defect: Secondary | ICD-10-CM | POA: Diagnosis not present

## 2018-12-24 DIAGNOSIS — Z96652 Presence of left artificial knee joint: Secondary | ICD-10-CM | POA: Diagnosis not present

## 2018-12-24 DIAGNOSIS — H903 Sensorineural hearing loss, bilateral: Secondary | ICD-10-CM | POA: Diagnosis not present

## 2018-12-24 DIAGNOSIS — N400 Enlarged prostate without lower urinary tract symptoms: Secondary | ICD-10-CM | POA: Diagnosis not present

## 2018-12-24 DIAGNOSIS — Z8673 Personal history of transient ischemic attack (TIA), 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 residual deficits: Secondary | ICD-10-CM | POA: Diagnosis not present

## 2018-12-24 DIAGNOSIS — M2142 Flat foot [pes planus] (acquired), left foot: Secondary | ICD-10-CM | POA: Diagnosis not present

## 2018-12-24 DIAGNOSIS — G4733 Obstructive sleep apnea (adult) (pediatric): Secondary | ICD-10-CM | POA: Diagnosis not present

## 2018-12-24 DIAGNOSIS — Z7982 Long term (current) use of aspirin: Secondary | ICD-10-CM | POA: Diagnosis not present

## 2018-12-24 DIAGNOSIS — Z471 Aftercare following joint replacement surgery: Secondary | ICD-10-CM | POA: Diagnosis not present

## 2018-12-24 DIAGNOSIS — I058 Other rheumatic mitral valve diseases: Secondary | ICD-10-CM | POA: Diagnosis not present

## 2018-12-24 DIAGNOSIS — I051 Rheumatic mitral insufficiency: Secondary | ICD-10-CM | POA: Diagnosis not present

## 2018-12-24 DIAGNOSIS — Z86010 Personal history of colonic polyps: Secondary | ICD-10-CM | POA: Diagnosis not present

## 2018-12-24 DIAGNOSIS — Z87891 Personal history of nicotine dependence: Secondary | ICD-10-CM | POA: Diagnosis not present

## 2018-12-24 DIAGNOSIS — Z85820 Personal history of malignant melanoma of skin: Secondary | ICD-10-CM | POA: Diagnosis not present

## 2018-12-24 DIAGNOSIS — Q242 Cor triatriatum: Secondary | ICD-10-CM | POA: Diagnosis not present

## 2018-12-24 DIAGNOSIS — Z9181 History of falling: Secondary | ICD-10-CM | POA: Diagnosis not present

## 2018-12-24 LAB — CUP PACEART REMOTE DEVICE CHECK
Date Time Interrogation Session: 20191208013810
Implantable Pulse Generator Implant Date: 20160727

## 2018-12-25 ENCOUNTER — Encounter (HOSPITAL_COMMUNITY): Payer: Self-pay | Admitting: Orthopedic Surgery

## 2018-12-25 NOTE — Care Management Obs Status (Signed)
[facility] NOTIFICATION   Patient Details  Name: James Tate MRN: [id number] Date of Birth: Aug 27, 1946   Medicare Observation Status Notification Given:  Yes    Purcell Mouton, RN 12/25/2018, 8:59 AM

## 2018-12-26 DIAGNOSIS — Q242 Cor triatriatum: Secondary | ICD-10-CM | POA: Diagnosis not present

## 2018-12-26 DIAGNOSIS — I051 Rheumatic mitral insufficiency: Secondary | ICD-10-CM | POA: Diagnosis not present

## 2018-12-26 DIAGNOSIS — Z471 Aftercare following joint replacement surgery: Secondary | ICD-10-CM | POA: Diagnosis not present

## 2018-12-26 DIAGNOSIS — H903 Sensorineural hearing loss, bilateral: Secondary | ICD-10-CM | POA: Diagnosis not present

## 2018-12-26 DIAGNOSIS — Q211 Atrial septal defect: Secondary | ICD-10-CM | POA: Diagnosis not present

## 2018-12-26 DIAGNOSIS — I058 Other rheumatic mitral valve diseases: Secondary | ICD-10-CM | POA: Diagnosis not present

## 2018-12-28 DIAGNOSIS — H903 Sensorineural hearing loss, bilateral: Secondary | ICD-10-CM | POA: Diagnosis not present

## 2018-12-28 DIAGNOSIS — Q211 Atrial septal defect: Secondary | ICD-10-CM | POA: Diagnosis not present

## 2018-12-28 DIAGNOSIS — Q242 Cor triatriatum: Secondary | ICD-10-CM | POA: Diagnosis not present

## 2018-12-28 DIAGNOSIS — I051 Rheumatic mitral insufficiency: Secondary | ICD-10-CM | POA: Diagnosis not present

## 2018-12-28 DIAGNOSIS — Z471 Aftercare following joint replacement surgery: Secondary | ICD-10-CM | POA: Diagnosis not present

## 2018-12-28 DIAGNOSIS — I058 Other rheumatic mitral valve diseases: Secondary | ICD-10-CM | POA: Diagnosis not present

## 2019-01-01 DIAGNOSIS — M25562 Pain in left knee: Secondary | ICD-10-CM | POA: Diagnosis not present

## 2019-01-01 DIAGNOSIS — Z471 Aftercare following joint replacement surgery: Secondary | ICD-10-CM | POA: Diagnosis not present

## 2019-01-01 DIAGNOSIS — M25662 Stiffness of left knee, not elsewhere classified: Secondary | ICD-10-CM | POA: Diagnosis not present

## 2019-01-02 DIAGNOSIS — M25562 Pain in left knee: Secondary | ICD-10-CM | POA: Diagnosis not present

## 2019-01-02 DIAGNOSIS — M25662 Stiffness of left knee, not elsewhere classified: Secondary | ICD-10-CM | POA: Diagnosis not present

## 2019-01-02 DIAGNOSIS — Z471 Aftercare following joint replacement surgery: Secondary | ICD-10-CM | POA: Diagnosis not present

## 2019-01-08 DIAGNOSIS — Z471 Aftercare following joint replacement surgery: Secondary | ICD-10-CM | POA: Diagnosis not present

## 2019-01-08 DIAGNOSIS — M25562 Pain in left knee: Secondary | ICD-10-CM | POA: Diagnosis not present

## 2019-01-08 DIAGNOSIS — M25662 Stiffness of left knee, not elsewhere classified: Secondary | ICD-10-CM | POA: Diagnosis not present

## 2019-01-11 DIAGNOSIS — Z471 Aftercare following joint replacement surgery: Secondary | ICD-10-CM | POA: Diagnosis not present

## 2019-01-11 DIAGNOSIS — M25662 Stiffness of left knee, not elsewhere classified: Secondary | ICD-10-CM | POA: Diagnosis not present

## 2019-01-11 DIAGNOSIS — M25562 Pain in left knee: Secondary | ICD-10-CM | POA: Diagnosis not present

## 2019-01-15 DIAGNOSIS — Z471 Aftercare following joint replacement surgery: Secondary | ICD-10-CM | POA: Diagnosis not present

## 2019-01-15 DIAGNOSIS — M25662 Stiffness of left knee, not elsewhere classified: Secondary | ICD-10-CM | POA: Diagnosis not present

## 2019-01-15 DIAGNOSIS — M25562 Pain in left knee: Secondary | ICD-10-CM | POA: Diagnosis not present

## 2019-01-18 DIAGNOSIS — Z471 Aftercare following joint replacement surgery: Secondary | ICD-10-CM | POA: Diagnosis not present

## 2019-01-18 DIAGNOSIS — M25662 Stiffness of left knee, not elsewhere classified: Secondary | ICD-10-CM | POA: Diagnosis not present

## 2019-01-18 DIAGNOSIS — M25562 Pain in left knee: Secondary | ICD-10-CM | POA: Diagnosis not present

## 2019-01-23 DIAGNOSIS — M25662 Stiffness of left knee, not elsewhere classified: Secondary | ICD-10-CM | POA: Diagnosis not present

## 2019-01-23 DIAGNOSIS — M25562 Pain in left knee: Secondary | ICD-10-CM | POA: Diagnosis not present

## 2019-01-23 DIAGNOSIS — Z471 Aftercare following joint replacement surgery: Secondary | ICD-10-CM | POA: Diagnosis not present

## 2019-01-23 DIAGNOSIS — Z9889 Other specified postprocedural states: Secondary | ICD-10-CM | POA: Diagnosis not present

## 2019-01-25 DIAGNOSIS — M25562 Pain in left knee: Secondary | ICD-10-CM | POA: Diagnosis not present

## 2019-01-25 DIAGNOSIS — Z471 Aftercare following joint replacement surgery: Secondary | ICD-10-CM | POA: Diagnosis not present

## 2019-01-25 DIAGNOSIS — M25662 Stiffness of left knee, not elsewhere classified: Secondary | ICD-10-CM | POA: Diagnosis not present

## 2019-01-30 DIAGNOSIS — Z471 Aftercare following joint replacement surgery: Secondary | ICD-10-CM | POA: Diagnosis not present

## 2019-01-30 DIAGNOSIS — M25562 Pain in left knee: Secondary | ICD-10-CM | POA: Diagnosis not present

## 2019-01-30 DIAGNOSIS — M25662 Stiffness of left knee, not elsewhere classified: Secondary | ICD-10-CM | POA: Diagnosis not present

## 2019-02-08 ENCOUNTER — Other Ambulatory Visit: Payer: Medicare Other

## 2019-02-08 DIAGNOSIS — Z471 Aftercare following joint replacement surgery: Secondary | ICD-10-CM | POA: Diagnosis not present

## 2019-02-08 DIAGNOSIS — M25562 Pain in left knee: Secondary | ICD-10-CM | POA: Diagnosis not present

## 2019-02-08 DIAGNOSIS — M25662 Stiffness of left knee, not elsewhere classified: Secondary | ICD-10-CM | POA: Diagnosis not present

## 2019-02-16 ENCOUNTER — Ambulatory Visit
Admission: RE | Admit: 2019-02-16 | Discharge: 2019-02-16 | Disposition: A | Discharge status: Home / Self Care | Payer: Medicare Other | Source: Ambulatory Visit | Attending: Gastroenterology | Admitting: Gastroenterology

## 2019-02-16 DIAGNOSIS — Z86010 Personal history of colonic polyps: Secondary | ICD-10-CM

## 2019-02-16 DIAGNOSIS — N400 Enlarged prostate without lower urinary tract symptoms: Secondary | ICD-10-CM | POA: Diagnosis not present

## 2019-03-21 DIAGNOSIS — Z471 Aftercare following joint replacement surgery: Secondary | ICD-10-CM | POA: Diagnosis not present

## 2019-03-27 DIAGNOSIS — Z1389 Encounter for screening for other disorder: Secondary | ICD-10-CM | POA: Diagnosis not present

## 2019-03-27 DIAGNOSIS — B356 Tinea cruris: Secondary | ICD-10-CM | POA: Diagnosis not present

## 2019-03-27 DIAGNOSIS — G4733 Obstructive sleep apnea (adult) (pediatric): Secondary | ICD-10-CM | POA: Diagnosis not present

## 2019-03-27 DIAGNOSIS — Z85820 Personal history of malignant melanoma of skin: Secondary | ICD-10-CM | POA: Diagnosis not present

## 2019-03-27 DIAGNOSIS — M8580 Other specified disorders of bone density and structure, unspecified site: Secondary | ICD-10-CM | POA: Diagnosis not present

## 2019-03-27 DIAGNOSIS — Z79899 Other long term (current) drug therapy: Secondary | ICD-10-CM | POA: Diagnosis not present

## 2019-03-27 DIAGNOSIS — Z0000 Encounter for general adult medical examination without abnormal findings: Secondary | ICD-10-CM | POA: Diagnosis not present

## 2019-03-27 DIAGNOSIS — E7800 Pure hypercholesterolemia, unspecified: Secondary | ICD-10-CM | POA: Diagnosis not present

## 2019-03-27 DIAGNOSIS — Z8673 Personal history of transient ischemic attack (TIA), 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 residual deficits: Secondary | ICD-10-CM | POA: Diagnosis not present

## 2019-03-27 DIAGNOSIS — B351 Tinea unguium: Secondary | ICD-10-CM | POA: Diagnosis not present

## 2019-05-15 ENCOUNTER — Telehealth: Payer: Self-pay

## 2019-05-15 NOTE — Telephone Encounter (Signed)
Left message for patient to call back. Patient will need consent to have virtual visit or reschedule for later in the year.

## 2019-05-15 NOTE — Telephone Encounter (Signed)
Virtual Visit Pre-Appointment Phone Call  "(Name), I am calling you today to discuss your upcoming appointment. We are currently trying to limit exposure to the virus that causes COVID-19 by seeing patients at home rather than in the office."  1. "What is the BEST phone number to call the day of the visit?" - include this in appointment notes  2. "Do you have or have access to (through a family member/friend) a smartphone with video capability that we can use for your visit?" a. If yes - list this number in appt notes as "cell" (if different from BEST phone #) and list the appointment type as a VIDEO visit in appointment notes b. If no - list the appointment type as a PHONE visit in appointment notes  3. Confirm consent - "In the setting of the current Covid19 crisis, you are scheduled for a (phone or video) visit with your provider on (date) at (time).  Just as we do with many in-office visits, in order for you to participate in this visit, we must obtain consent.  If you'd like, I can send this to your mychart (if signed up) or email for you to review.  Otherwise, I can obtain your verbal consent now.  All virtual visits are billed to your insurance company just like a normal visit would be.  By agreeing to a virtual visit, we'd like you to understand that the technology does not allow for your provider to perform an examination, and thus may limit your provider's ability to fully assess your condition. If your provider identifies any concerns that need to be evaluated in person, we will make arrangements to do so.  Finally, though the technology is pretty good, we cannot assure that it will always work on either your or our end, and in the setting of a video visit, we may have to convert it to a phone-only visit.  In either situation, we cannot ensure that we have a secure connection.  Are you willing to proceed? YES  4. Advise patient to be prepared - "Two hours prior to your appointment, go  ahead and check your blood pressure, pulse, oxygen saturation, and your weight (if you have the equipment to check those) and write them all down. When your visit starts, your provider will ask you for this information. If you have an Apple Watch or Kardia device, please plan to have heart rate information ready on the day of your appointment. Please have a pen and paper handy nearby the day of the visit as well."  5. Give patient instructions for MyChart download to smartphone OR Doximity/Doxy.me as below if video visit (depending on what platform provider is using)  6. Inform patient they will receive a phone call 15 minutes prior to their appointment time (may be from unknown caller ID) so they should be prepared to answer    TELEPHONE CALL NOTE  James Tate has been deemed a candidate for a follow-up tele-health visit to limit community exposure during the Covid-19 pandemic. I spoke with the patient via phone to ensure availability of phone/video source, confirm preferred email & phone number, and discuss instructions and expectations.  I reminded James Tate to be prepared with any vital sign and/or heart rhythm information that could potentially be obtained via home monitoring, at the time of his visit. I reminded James Tate to expect a phone call prior to his visit.  James Barter, RN 05/15/2019 11:11 AM   IF  USING DOXIMITY or DOXY.ME - The patient will receive a link just prior to their visit by text.     FULL LENGTH CONSENT FOR TELE-HEALTH VISIT   I hereby voluntarily request, consent and authorize [facility] and its employed or contracted physicians, physician assistants, nurse practitioners or other licensed health care professionals (the Practitioner), to provide me with telemedicine health care services (the "Services") as deemed necessary by the treating Practitioner. I acknowledge and consent to receive the Services by the Practitioner via telemedicine. I  understand that the telemedicine visit will involve communicating with the Practitioner through live audiovisual communication technology and the disclosure of certain medical information by electronic transmission. I acknowledge that I have been given the opportunity to request an in-person assessment or other available alternative prior to the telemedicine visit and am voluntarily participating in the telemedicine visit.  I understand that I have the right to withhold or withdraw my consent to the use of telemedicine in the course of my care at any time, without affecting my right to future care or treatment, and that the Practitioner or I may terminate the telemedicine visit at any time. I understand that I have the right to inspect all information obtained and/or recorded in the course of the telemedicine visit and may receive copies of available information for a reasonable fee.  I understand that some of the potential risks of receiving the Services via telemedicine include:  James Tate Delay or interruption in medical evaluation due to technological equipment failure or disruption; . Information transmitted may not be sufficient (e.g. poor resolution of images) to allow for appropriate medical decision making by the Practitioner; and/or  . In rare instances, security protocols could fail, causing a breach of personal health information.  Furthermore, I acknowledge that it is my responsibility to provide information about my medical history, conditions and care that is complete and accurate to the best of my ability. I acknowledge that Practitioner's advice, recommendations, and/or decision may be based on factors not within their control, such as incomplete or inaccurate data provided by me or distortions of diagnostic images or specimens that may result from electronic transmissions. I understand that the practice of medicine is not an exact science and that Practitioner makes no warranties or guarantees  regarding treatment outcomes. I acknowledge that I will receive a copy of this consent concurrently upon execution via email to the email address I last provided but may also request a printed copy by calling the office of [facility].    I understand that my insurance will be billed for this visit.   I have read or had this consent read to me. . I understand the contents of this consent, which adequately explains the benefits and risks of the Services being provided via telemedicine.  . I have been provided ample opportunity to ask questions regarding this consent and the Services and have had my questions answered to my satisfaction. . I give my informed consent for the services to be provided through the use of telemedicine in my medical care  By participating in this telemedicine visit I agree to the above.

## 2019-05-21 ENCOUNTER — Ambulatory Visit: Payer: Medicare Other | Admitting: Cardiovascular Disease

## 2019-05-28 ENCOUNTER — Ambulatory Visit: Payer: Medicare Other | Admitting: Cardiovascular Disease

## 2019-05-31 DIAGNOSIS — Z96652 Presence of left artificial knee joint: Secondary | ICD-10-CM | POA: Diagnosis not present

## 2019-05-31 DIAGNOSIS — Z09 Encounter for follow-up examination after completed treatment for conditions other than malignant neoplasm: Secondary | ICD-10-CM | POA: Diagnosis not present

## 2019-05-31 DIAGNOSIS — M1712 Unilateral primary osteoarthritis, left knee: Secondary | ICD-10-CM | POA: Diagnosis not present

## 2019-06-17 NOTE — Progress Notes (Signed)
Virtual Visit via Video Note   This visit type was conducted due to national recommendations for restrictions regarding the COVID-19 Pandemic (e.g. social distancing) in an effort to limit this patient's exposure and mitigate transmission in our community.  Due to his co-morbid illnesses, this patient is at least at moderate risk for complications without adequate follow up.  This format is felt to be most appropriate for this patient at this time.  All issues noted in this document were discussed and addressed.  A limited physical exam was performed with this format.  Please refer to the patient's chart for his consent to telehealth for [facility].   Date:  06/21/2019   ID:  James Tate, DOB [birth year], MRN [id number]  Patient Location: Home Provider Location: Office  PCP:  Gaynelle Arabian, MD  Cardiologist:   Johnsie Cancel Electrophysiologist:  None   Evaluation Performed:  Follow-Up Visit  Chief Complaint:  TIA/MR  History of Present Illness:    73 y.o. . CVA Rx [place] April 2016. Acute right facial numbness with slurred speech. MRI, carotids and telemetry unrevealing Rx initially with ASA and statin Full recovery Subsequent TEE cor triatriatum and PFO  Seen by Dr Burt Knack who felt no closure indicated at that time unless recurrence ILR placed by Dr Rayann Heman 07/02/15. 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    Past Medical History:   Diagnosis Date  . Arthritis   . BPH (benign prostatic hyperplasia)   . Cor triatriatum   . 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 Flat feet   . History of colon polyps   . History of gallstones   . History of hyperparathyroidism   . 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 MR (mitral regurgitation) 05/16/2018   Moderate noted on ECHO  . MVP (mitral valve prolapse) 05/16/2018   Noted on ECHO  . OSA on CPAP   . Osteopenia    resolved with medical therapy (calcium and fosamax)  . PFO (patent foramen ovale)   . Sensorineural hearing loss (SNHL) of both ears   . Wears hearing aid    both   Past Surgical History:  Procedure Laterality Date  . APPLICATION OF A-CELL OF BACK N/A 12/19/2014   Procedure: APPLICATION OF A-CELL OF BACK;  Surgeon: Theodoro Kos, DO;  Location: [facility];  Service: Plastics;  Laterality: N/A;  . CHOLECYSTECTOMY  1996  . COLONOSCOPY    . EP IMPLANTABLE DEVICE N/A 07/02/2015   Procedure: Loop Recorder Insertion;  Surgeon: Thompson Grayer, MD;  Location: [facility];  Service: Cardiovascular;  Laterality: N/A;  . implantable loop recorder removal  12/13/2018   MDT LINQ removed in office by Dr Rayann Heman  . LASIK  2003  . MELANOMA EXCISION N/A 10/04/2014   Procedure: WIDE LOCAL EXCISION OF UPPER BACK MELANOMA ;  Surgeon: Gayland Curry, MD;  Location: [facility];  Service: General;  Laterality: N/A;  . PARATHYROIDECTOMY  2004  . TEE WITHOUT CARDIOVERSION N/A 06/12/2015   Procedure: TRANSESOPHAGEAL ECHOCARDIOGRAM (TEE);  Surgeon: Josue Hector, MD;  Location: [facility] END[facility];  Service: Cardiovascular;  Laterality: N/A;  . TONSILLECTOMY    . TOTAL KNEE ARTHROPLASTY Left 12/22/2018   Procedure: TOTAL KNEE ARTHROPLASTY;  Surgeon: Dorna Leitz, MD;  Location: [facility];  Service: Orthopedics;  Laterality: Left;     Current Meds  Medication Sig  . aspirin EC 325 MG tablet Take 1 tablet (325 mg total) by mouth 2 (two) times daily after a meal. Take x 1 month post op to decrease  risk of blood clots.  . Cholecalciferol (D2000 ULTRA STRENGTH) 50 MCG (2000 UT) CAPS Take 2,000 Units by mouth daily.  . Cholecalciferol (VITAMIN D PO) Take 1 tablet by mouth every morning.  . Cyanocobalamin (VITAMIN B-12 PO) Take 1 tablet by mouth every morning.  . docusate sodium (COLACE) 100 MG capsule Take 1 capsule (100 mg total) by mouth 2 (two) times daily.  . Miconazole Nitrate 2 % OINT Apply 1 application topically daily as needed.  . Multiple Minerals-Vitamins (CAL-MAG-ZINC-D PO) Take 1 tablet by mouth daily.  . Multiple Vitamin (MULTIVITAMIN) capsule Take 1 capsule by mouth daily.  . Omega-3 Fatty Acids (OMEGA 3 PO) Take 1 tablet by mouth every morning.  Marland Kitchen oxyCODONE-acetaminophen (PERCOCET/ROXICET) 5-325 MG tablet Take 1-2 tablets by mouth every 6 (six) hours as needed for severe pain.  . rosuvastatin (CRESTOR) 5 MG tablet Take 5 mg by mouth daily.  . tadalafil (CIALIS) 20 MG tablet Take 20 mg by mouth daily as needed for erectile dysfunction.  . terbinafine (LAMISIL) 250 MG tablet Crush 2 tablets in fungi-nail solution and apply once daily to affected toenails  . tiZANidine (ZANAFLEX) 2 MG tablet Take 1 tablet (2 mg total) by mouth every 8 (eight) hours as needed for muscle spasms.  Marland Kitchen triamcinolone (KENALOG) 0.1 % paste Use as directed 1 application in the mouth or throat 2 (two) times daily as needed (mouth sores).  . Vitamin K, Phytonadione, 100 MCG TABS Take 100 mcg by mouth daily.      Allergies:   Codeine and Erythromycin   Social History   Tobacco Use  . Smoking status: Former Smoker    Quit date: 09/27/1971    Years since quitting: 47.7  . Smokeless tobacco: Never Used  Substance Use Topics  . Alcohol use: Yes    Comment: OCCASIONAL  . Drug use: No     Family Hx: The patient's family history includes Heart Problems in his mother; Osteoporosis in his father; Other in his maternal grandfather.  ROS:   Please see the history of present illness.     All other  systems reviewed and are negative.   Prior CV studies:   The following studies were reviewed today:  Echo June 2019   Labs/Other Tests and Data Reviewed:    EKG:  None done for tele visit   Recent Labs: 12/18/2018: ALT 30; BUN 21; Creatinine, Ser 0.86; Potassium 4.3; Sodium 141 12/23/2018: Hemoglobin 12.9; Platelets 222   Recent Lipid Panel Lab Results  Component Value Date/Time   CHOL 118 (L) 05/24/2016 10:32 AM   TRIG 88 05/24/2016 10:32 AM   HDL 51 05/24/2016 10:32 AM   CHOLHDL 2.3 05/24/2016 10:32 AM   LDLCALC 49 05/24/2016 10:32 AM    Wt Readings from Last 3 Encounters:  06/21/19 187 lb (84.8 kg)  12/22/18 188 lb (85.3 kg)  12/18/18  188 lb (85.3 kg)     Objective:    Vital Signs:  BP 114/67   Pulse 64   Ht 6' (1.829 m)   Wt 187 lb (84.8 kg)   BMI 25.36 kg/m    Skin warm and dry No distress No tachypnea No JVP elevation  Neuro appears non focal No edema  Telephone visit no exam   ASSESSMENT & PLAN:    1. CVA:  ASA no cause found PFO no closure per Dr Burt Knack ILR explanted no PAF 2. PFO:  See above having f/u echo will do with bubble see below 3. MR:  With posterior leaflet prolapse moderate TTE June 2019 f/u echo ordered  4. Prostate: f/u Dr Louis Meckel PSA historically elevated with negative biopsies  5. HLD:  On statin labs with primary 6. Ortho:  F/u Dr Berenice Primas post left TKR January 2020    COVID-19 Education: The signs and symptoms of COVID-19 were discussed with the patient and how to seek care for testing (follow up with PCP or arrange E-visit).  The importance of social distancing was discussed today.  Time:   Today, I have spent 30 minutes with the patient with telehealth technology discussing the above problems.     Medication Adjustments/Labs and Tests Ordered: Current medicines are reviewed at length with the patient today.  Concerns regarding medicines are outlined above.   Tests Ordered:  Echo for MVP and moderate MR   Medication  Changes:  None   Disposition:  Follow up in a year if echo stable  Signed, Jenkins Rouge, MD  06/21/2019 8:43 AM    [place]

## 2019-06-19 ENCOUNTER — Telehealth: Payer: Self-pay | Admitting: Cardiovascular Disease

## 2019-06-19 NOTE — Telephone Encounter (Signed)
New Message ° ° ° °Left message to confirm appt and answer covid questions  °

## 2019-06-20 NOTE — Telephone Encounter (Signed)
error 

## 2019-06-20 NOTE — Telephone Encounter (Signed)
Patient is returning your call.  

## 2019-06-21 ENCOUNTER — Telehealth (INDEPENDENT_AMBULATORY_CARE_PROVIDER_SITE_OTHER): Payer: Medicare Other | Admitting: Cardiovascular Disease

## 2019-06-21 ENCOUNTER — Other Ambulatory Visit: Payer: Self-pay

## 2019-06-21 ENCOUNTER — Encounter: Payer: Self-pay | Admitting: Cardiovascular Disease

## 2019-06-21 VITALS — BP 114/67 | HR 64 | Ht 72.0 in | Wt 187.0 lb

## 2019-06-21 DIAGNOSIS — Q2112 Patent foramen ovale: Secondary | ICD-10-CM

## 2019-06-21 DIAGNOSIS — I341 Nonrheumatic mitral (valve) prolapse: Secondary | ICD-10-CM

## 2019-06-21 DIAGNOSIS — Q211 Atrial septal defect: Secondary | ICD-10-CM | POA: Diagnosis not present

## 2019-06-21 DIAGNOSIS — I340 Nonrheumatic mitral (valve) insufficiency: Secondary | ICD-10-CM

## 2019-06-21 NOTE — Patient Instructions (Signed)
Your physician recommends that you continue on your current medications as directed. Please refer to the Current Medication list given to you today.  Your physician has requested that you have an echocardiogram. Echocardiography is a painless test that uses sound waves to create images of your heart. It provides your doctor with information about the size and shape of your heart and how well your heart's chambers and valves are working. This procedure takes approximately one hour. There are no restrictions for this procedure.  END OF AUGUST  Your physician wants you to follow-up in:  [place] will receive a reminder letter in the mail two months in advance. If you don't receive a letter, please call our office to schedule the follow-up appointment.

## 2019-07-19 DIAGNOSIS — R972 Elevated prostate specific antigen [PSA]: Secondary | ICD-10-CM | POA: Diagnosis not present

## 2019-08-01 DIAGNOSIS — G4733 Obstructive sleep apnea (adult) (pediatric): Secondary | ICD-10-CM | POA: Diagnosis not present

## 2019-08-02 ENCOUNTER — Other Ambulatory Visit: Payer: Self-pay

## 2019-08-02 ENCOUNTER — Ambulatory Visit (HOSPITAL_COMMUNITY): Payer: Medicare Other | Attending: Cardiology

## 2019-08-02 DIAGNOSIS — Q2112 Patent foramen ovale: Secondary | ICD-10-CM

## 2019-08-02 DIAGNOSIS — I341 Nonrheumatic mitral (valve) prolapse: Secondary | ICD-10-CM | POA: Insufficient documentation

## 2019-08-02 DIAGNOSIS — Q211 Atrial septal defect: Secondary | ICD-10-CM | POA: Diagnosis not present

## 2019-08-14 DIAGNOSIS — L821 Other seborrheic keratosis: Secondary | ICD-10-CM | POA: Diagnosis not present

## 2019-08-14 DIAGNOSIS — D229 Melanocytic nevi, unspecified: Secondary | ICD-10-CM | POA: Diagnosis not present

## 2019-08-14 DIAGNOSIS — Z85820 Personal history of malignant melanoma of skin: Secondary | ICD-10-CM | POA: Diagnosis not present

## 2019-08-14 DIAGNOSIS — L819 Disorder of pigmentation, unspecified: Secondary | ICD-10-CM | POA: Diagnosis not present

## 2019-08-14 DIAGNOSIS — D1801 Hemangioma of skin and subcutaneous tissue: Secondary | ICD-10-CM | POA: Diagnosis not present

## 2019-08-14 DIAGNOSIS — L814 Other melanin hyperpigmentation: Secondary | ICD-10-CM | POA: Diagnosis not present

## 2019-08-20 DIAGNOSIS — M7701 Medial epicondylitis, right elbow: Secondary | ICD-10-CM | POA: Diagnosis not present

## 2019-09-06 DIAGNOSIS — N400 Enlarged prostate without lower urinary tract symptoms: Secondary | ICD-10-CM | POA: Diagnosis not present

## 2019-09-06 DIAGNOSIS — R972 Elevated prostate specific antigen [PSA]: Secondary | ICD-10-CM | POA: Diagnosis not present

## 2019-09-06 DIAGNOSIS — N5201 Erectile dysfunction due to arterial insufficiency: Secondary | ICD-10-CM | POA: Diagnosis not present

## 2019-09-19 DIAGNOSIS — M7701 Medial epicondylitis, right elbow: Secondary | ICD-10-CM | POA: Diagnosis not present

## 2019-09-29 DIAGNOSIS — Z23 Encounter for immunization: Secondary | ICD-10-CM | POA: Diagnosis not present

## 2020-01-17 ENCOUNTER — Ambulatory Visit: Payer: Medicare Other | Attending: Internal Medicine

## 2020-01-17 DIAGNOSIS — Z23 Encounter for immunization: Secondary | ICD-10-CM | POA: Insufficient documentation

## 2020-01-17 NOTE — Progress Notes (Signed)
   Covid-19 Vaccination Clinic  Name:  James Tate    MRN: [id number] DOB: 1946-06-18  01/17/2020  [name] was observed post Covid-19 immunization for 15 minutes without incidence. He was provided with Vaccine Information Sheet and instruction to access the V-Safe system.   [name] was instructed to call 911 with any severe reactions post vaccine: Marland Kitchen Difficulty breathing  . Swelling of your face and throat  . A fast heartbeat  . A bad rash all over your body  . Dizziness and weakness    Immunizations Administered    Name Date Dose VIS Date Route   Pfizer COVID-19 Vaccine 01/17/2020  8:33 AM 0.3 mL 11/16/2019 Intramuscular   Manufacturer: [place]   Lot: [id number]   [place]: [id number]

## 2020-01-31 NOTE — Progress Notes (Signed)
Date:  02/05/2020   ID:  James Tate, DOB [birth year], MRN [id number]  PCP:  Gaynelle Arabian, MD  Cardiologist:   Johnsie Cancel Electrophysiologist:  None   Evaluation Performed:  Follow-Up Visit  Chief Complaint:  TIA/MR  History of Present Illness:    74 y.o. . CVA Rx [place] April 2016. Acute right facial numbness with slurred speech. MRI, carotids and telemetry unrevealing Rx initially with ASA and statin Full recovery Subsequent TEE cor triatriatum and PFO  Seen by Dr Burt Knack who felt no closure indicated at that time unless recurrence ILR placed by Dr Rayann Heman 07/02/15. 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 Memory issues with crestor Discussed statin holiday If memory  Improves f/u with lipid clinic consider Nexlitol    Past Medical History:  Diagnosis Date  . Arthritis   . BPH (benign prostatic hyperplasia)   . Cor triatriatum   . 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 Flat feet   . History of colon polyps   . History of gallstones   . History of hyperparathyroidism   . Melanoma of back ([name])   . MR (mitral regurgitation) 05/16/2018   Moderate noted on ECHO  . MVP (mitral valve prolapse) 05/16/2018   Noted on ECHO  . OSA on CPAP   . Osteopenia    resolved with medical therapy (calcium and fosamax)  . PFO (patent foramen ovale)   . Sensorineural hearing loss (SNHL) of both ears   . Wears hearing aid     both   Past Surgical History:  Procedure Laterality Date  . APPLICATION OF A-CELL OF BACK N/A 12/19/2014   Procedure: APPLICATION OF A-CELL OF BACK;  Surgeon: Theodoro Kos, DO;  Location: [facility];  Service: Plastics;  Laterality: N/A;  . CHOLECYSTECTOMY  1996  . COLONOSCOPY    . EP IMPLANTABLE DEVICE N/A 07/02/2015   Procedure: Loop Recorder Insertion;  Surgeon: Thompson Grayer, MD;  Location: [facility];  Service: Cardiovascular;  Laterality: N/A;  . implantable loop recorder removal  12/13/2018   MDT LINQ removed in office by Dr Rayann Heman  . LASIK  2003  . MELANOMA EXCISION N/A 10/04/2014   Procedure: WIDE LOCAL EXCISION OF UPPER BACK MELANOMA ;  Surgeon: Gayland Curry, MD;  [facility];  Service: General;  Laterality: N/A;  . PARATHYROIDECTOMY  2004  . TEE WITHOUT CARDIOVERSION N/A 06/12/2015   Procedure: TRANSESOPHAGEAL ECHOCARDIOGRAM (TEE);  Surgeon: Josue Hector, MD;  Location: [facility] ENDOSCOPY;  Service: Cardiovascular;  Laterality: N/A;  . TONSILLECTOMY    . TOTAL KNEE ARTHROPLASTY Left 12/22/2018   Procedure: TOTAL KNEE ARTHROPLASTY;  Surgeon: Dorna Leitz, MD;  Location: [facility];  Service: Orthopedics;  Laterality: Left;     Current Meds  Medication Sig  . aspirin EC 325 MG tablet Take 1 tablet (325 mg total)  by mouth 2 (two) times daily after a meal. Take x 1 month post op to decrease risk of blood clots.  . Cholecalciferol (D2000 ULTRA STRENGTH) 50 MCG (2000 UT) CAPS Take 2,000 Units by mouth daily.  . Cholecalciferol (VITAMIN D PO) Take 1 tablet by mouth every morning.  . Cyanocobalamin (VITAMIN B-12 PO) Take 1 tablet by mouth every morning.  . Miconazole Nitrate 2 % OINT Apply 1 application topically daily as needed.  . Multiple Minerals-Vitamins (CAL-MAG-ZINC-D PO) Take 1 tablet by mouth daily.  . Multiple Vitamin (MULTIVITAMIN) capsule Take 1 capsule by mouth daily.  . Omega-3 Fatty Acids (OMEGA 3 PO) Take 1 tablet by mouth every morning.  Marland Kitchen  oxyCODONE-acetaminophen (PERCOCET/ROXICET) 5-325 MG tablet Take 1-2 tablets by mouth every 6 (six) hours as needed for severe pain.  . rosuvastatin (CRESTOR) 5 MG tablet Take 5 mg by mouth daily.  . tadalafil (CIALIS) 20 MG tablet Take 20 mg by mouth daily as needed for erectile dysfunction.  . terbinafine (LAMISIL) 250 MG tablet Crush 2 tablets in fungi-nail solution and apply once daily to affected toenails  . tiZANidine (ZANAFLEX) 2 MG tablet Take 1 tablet (2 mg total) by mouth every 8 (eight) hours as needed for muscle spasms.  Marland Kitchen triamcinolone (KENALOG) 0.1 % paste Use as directed 1 application in the mouth or throat 2 (two) times daily as needed (mouth sores).  . Vitamin K, Phytonadione, 100 MCG TABS Take 100 mcg by mouth daily.      Allergies:   Codeine and Erythromycin   Social History   Tobacco Use  . Smoking status: Former Smoker    Quit date: 09/27/1971    Years since quitting: 48.3  . Smokeless tobacco: Never Used  Substance Use Topics  . Alcohol use: Yes    Comment: OCCASIONAL  . Drug use: No     Family Hx: The patient's family history includes Heart Problems in his mother; Osteoporosis in his father; Other in his maternal grandfather.  ROS:   Please see the history of present illness.     All other systems reviewed and are negative.   Prior CV studies:   The following studies were reviewed today:  Echo June 2019   Labs/Other Tests and Data Reviewed:    EKG:  None done for tele visit   Recent Labs: No results found for requested labs within last 8760 hours.   Recent Lipid Panel Lab Results  Component Value Date/Time   CHOL 118 (L) 05/24/2016 10:32 AM   TRIG 88 05/24/2016 10:32 AM   HDL 51 05/24/2016 10:32 AM   CHOLHDL 2.3 05/24/2016 10:32 AM   LDLCALC 49 05/24/2016 10:32 AM    Wt Readings from Last 3 Encounters:  02/05/20 194 lb 1.3 oz (88 kg)  06/21/19 187 lb (84.8 kg)  12/22/18 188 lb (85.3 kg)     Objective:    Vital Signs:  BP 132/74    Pulse 74   Ht 6' (1.829 m)   Wt 194 lb 1.3 oz (88 kg)   SpO2 98%   BMI 26.32 kg/m    Affect appropriate Healthy:  appears stated age HEENT: normal Neck supple with no adenopathy JVP normal no bruits no thyromegaly Lungs clear with no wheezing and good diaphragmatic motion Heart:  S1/S2 MR murmur, no rub, gallop or click PMI normal Abdomen: benighn, BS positve, no tenderness, no AAA no bruit.  No HSM or HJR Distal pulses intact with no bruits No edema Neuro non-focal Skin warm  and dry No muscular weakness   ASSESSMENT & PLAN:    1. CVA:  ASA no cause found PFO no closure per Dr Burt Knack ILR explanted no PAF 2. PFO:  See above echo 08/02/19 no PFO by color flow no bubble study done 3. MR:  With posterior leaflet prolapse moderate TTE June 2019 f/u echo 08/02/19 only mild MR  4. Prostate: f/u Dr Louis Meckel PSA historically elevated with negative biopsies  5. HLD:  Hold crestor ? Memory issues f/u lipid clinic 8 weeks if memory better ? Nexlitol  6. Ortho:  F/u Dr Berenice Primas post left TKR January 2020    COVID-19 Education: The signs and symptoms of COVID-19 were discussed with the patient and how to seek care for testing (follow up with PCP or arrange E-visit).  The importance of social distancing was discussed today.  Time:   Today, I have spent 30 minutes with the patient with telehealth technology discussing the above problems.     Medication Adjustments/Labs and Tests Ordered: Current medicines are reviewed at length with the patient today.  Concerns regarding medicines are outlined above.   Tests Ordered:  None    Medication Changes:  Hold Crestor   Disposition:  Follow up in a year if echo stable  Signed, Jenkins Rouge, MD  02/05/2020 10:44 AM    [place]

## 2020-02-05 ENCOUNTER — Encounter: Payer: Self-pay | Admitting: Cardiovascular Disease

## 2020-02-05 ENCOUNTER — Ambulatory Visit (INDEPENDENT_AMBULATORY_CARE_PROVIDER_SITE_OTHER): Payer: Medicare Other | Admitting: Cardiovascular Disease

## 2020-02-05 ENCOUNTER — Other Ambulatory Visit: Payer: Self-pay

## 2020-02-05 VITALS — BP 132/74 | HR 74 | Ht 72.0 in | Wt 194.1 lb

## 2020-02-05 DIAGNOSIS — Z789 Other specified health status: Secondary | ICD-10-CM | POA: Diagnosis not present

## 2020-02-05 DIAGNOSIS — I341 Nonrheumatic mitral (valve) prolapse: Secondary | ICD-10-CM | POA: Diagnosis not present

## 2020-02-05 DIAGNOSIS — I639 Cerebral infarction, unspecified: Secondary | ICD-10-CM

## 2020-02-05 NOTE — Patient Instructions (Addendum)
  Medication Instructions:  Your physician has recommended you make the following change in your medication:  1. STOP CRESTOR  *If you need a refill on your cardiac medications before your next appointment, please call your pharmacy*   Lab Work: NONE If you have labs (blood work) drawn today and your tests are completely normal, you will receive your results only by: Marland Kitchen MyChart Message (if you have MyChart) OR . A paper copy in the mail If you have any lab test that is abnormal or we need to change your treatment, we will call you to review the results.   Testing/Procedures: NONE   Follow-Up: At [facility], you and your health needs are our priority.  As part of our continuing mission to provide you with exceptional heart care, we have created designated Provider Care Teams.  These Care Teams include your primary Cardiologist (physician) and Advanced Practice Providers (APPs -  Physician Assistants and Nurse Practitioners) who all work together to provide you with the care you need, when you need it.  We recommend signing up for the patient portal called "MyChart".  Sign up information is provided on this After Visit Summary.  MyChart is used to connect with patients for Virtual Visits (Telemedicine).  Patients are able to view lab/test results, encounter notes, upcoming appointments, etc.  Non-urgent messages can be sent to your provider as well.   To learn more about what you can do with MyChart, go to [email or web address].    Your next appointment:   1 YEAR  The format for your next appointment:   In Person  Provider:   DR Aquasco in 8 weeks   Other Instructions

## 2020-02-07 DIAGNOSIS — M533 Sacrococcygeal disorders, not elsewhere classified: Secondary | ICD-10-CM | POA: Diagnosis not present

## 2020-02-07 DIAGNOSIS — M545 Low back pain: Secondary | ICD-10-CM | POA: Diagnosis not present

## 2020-02-07 DIAGNOSIS — M25551 Pain in right hip: Secondary | ICD-10-CM | POA: Diagnosis not present

## 2020-02-12 ENCOUNTER — Ambulatory Visit: Payer: Medicare Other | Attending: Internal Medicine

## 2020-02-12 DIAGNOSIS — Z23 Encounter for immunization: Secondary | ICD-10-CM | POA: Insufficient documentation

## 2020-02-12 NOTE — Progress Notes (Signed)
   Covid-19 Vaccination Clinic  Name:  James Tate    MRN: [id number] DOB: 03-04-1946  02/12/2020  Mr. Reimann was observed post Covid-19 immunization for 15 minutes without incident. He was provided with Vaccine Information Sheet and instruction to access the V-Safe system.   Mr. Tischer was instructed to call 911 with any severe reactions post vaccine: Marland Kitchen Difficulty breathing  . Swelling of face and throat  . A fast heartbeat  . A bad rash all over body  . Dizziness and weakness   Immunizations Administered    Name Date Dose VIS Date Route   Pfizer COVID-19 Vaccine 02/12/2020  8:34 AM 0.3 mL 11/16/2019 Intramuscular   Manufacturer: [place]   Lot: [id number]   [place]: [id number]

## 2020-03-17 DIAGNOSIS — M25551 Pain in right hip: Secondary | ICD-10-CM | POA: Diagnosis not present

## 2020-03-17 DIAGNOSIS — M545 Low back pain: Secondary | ICD-10-CM | POA: Diagnosis not present

## 2020-03-17 DIAGNOSIS — M5441 Lumbago with sciatica, right side: Secondary | ICD-10-CM | POA: Diagnosis not present

## 2020-03-27 DIAGNOSIS — Z85820 Personal history of malignant melanoma of skin: Secondary | ICD-10-CM | POA: Diagnosis not present

## 2020-03-27 DIAGNOSIS — M8580 Other specified disorders of bone density and structure, unspecified site: Secondary | ICD-10-CM | POA: Diagnosis not present

## 2020-03-27 DIAGNOSIS — Z0000 Encounter for general adult medical examination without abnormal findings: Secondary | ICD-10-CM | POA: Diagnosis not present

## 2020-03-27 DIAGNOSIS — Z79899 Other long term (current) drug therapy: Secondary | ICD-10-CM | POA: Diagnosis not present

## 2020-03-27 DIAGNOSIS — E7800 Pure hypercholesterolemia, unspecified: Secondary | ICD-10-CM | POA: Diagnosis not present

## 2020-03-27 DIAGNOSIS — G4733 Obstructive sleep apnea (adult) (pediatric): Secondary | ICD-10-CM | POA: Diagnosis not present

## 2020-03-27 DIAGNOSIS — Z8673 Personal history of transient ischemic attack (TIA), 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 residual deficits: Secondary | ICD-10-CM | POA: Diagnosis not present

## 2020-03-27 DIAGNOSIS — Z1389 Encounter for screening for other disorder: Secondary | ICD-10-CM | POA: Diagnosis not present

## 2020-03-31 ENCOUNTER — Other Ambulatory Visit: Payer: Self-pay | Admitting: Family Medicine

## 2020-03-31 DIAGNOSIS — M25559 Pain in unspecified hip: Secondary | ICD-10-CM | POA: Diagnosis not present

## 2020-03-31 DIAGNOSIS — M545 Low back pain: Secondary | ICD-10-CM | POA: Diagnosis not present

## 2020-03-31 DIAGNOSIS — M8580 Other specified disorders of bone density and structure, unspecified site: Secondary | ICD-10-CM

## 2020-04-01 ENCOUNTER — Other Ambulatory Visit: Payer: Self-pay

## 2020-04-01 ENCOUNTER — Encounter: Payer: Self-pay | Admitting: Pharmacist

## 2020-04-01 ENCOUNTER — Telehealth: Payer: Self-pay | Admitting: Pharmacist

## 2020-04-01 ENCOUNTER — Ambulatory Visit (INDEPENDENT_AMBULATORY_CARE_PROVIDER_SITE_OTHER): Payer: Medicare Other | Admitting: Pharmacist

## 2020-04-01 DIAGNOSIS — I639 Cerebral infarction, unspecified: Secondary | ICD-10-CM | POA: Diagnosis not present

## 2020-04-01 MED ORDER — PRALUENT 75 MG/ML ~~LOC~~ SOAJ: 1.0000 "pen " | SUBCUTANEOUS | 11 refills | Status: DC

## 2020-04-01 NOTE — Telephone Encounter (Signed)
Prior authorization for Praluent approved through 04/01/2023 Rx sent to pharmacy Called pharmacy, cost is $28 Called patient to let him know. Left VM for him to return call- will also need to set up labs

## 2020-04-01 NOTE — Telephone Encounter (Signed)
Patient returned phone call. He is ok with copay of $28.  Labs set up for 7/12

## 2020-04-01 NOTE — Addendum Note (Signed)
Addended by: Marcelle Overlie D on: 04/01/2020 04:51 PM   Modules accepted: Orders

## 2020-04-01 NOTE — Progress Notes (Signed)
Patient ID: James Tate                 DOB: 28-Aug-1946                    MRN: [id number]     HPI: James Tate is a 74 y.o. male patient referred to lipid clinic by Dr. Johnsie Cancel. PMH is significant for CVA, PFO and mitral valve prolapse. Patient had ILR placed, no afib seen. Patient saw Dr. Johnsie Cancel about 8 weeks ago and was concerned about memory issues with rosuvastatin. Dr. Johnsie Cancel advised a drug holiday and referred patient to lipid clinic.  Patient presents today for initial appointment. He is a very pleasant 74 year old man. He states that he feels a lot better off the medication. He states his peripheral neuropathy improved since stopping rosuvastatin. He thinks his memory is a little better, but I would have to ask him wife. Baseline labs done a few days ago show an LDL of 93. Overall his diet is decent, but does eat a lot of ice cream. Exercises almost daily.  Rx BIN [id number] PCN MEDDPRIME GRP DEMEDDX ID [id number]  Current Medications: none Intolerances: atorvastatin (myalgias) rosuvastatin 5mg  daily (memory issues/neuropathay in feet) Risk Factors: CVA LDL goal: <70  Diet: breakfast: raisin bran w/ blue berries, chocolate milk (2%) Lunch: lunch meat and cheese sandwich, lettuce tomato, potato chips Dinner: chicken, steak or fish, potato and vegetable (sometimes pan fried w/ butter, mostly grilled) Snacks: 2 servings of ice cream nightly Drink: mostly ice tea (crystal light), some caffeine free diet soda  Exercise: stationary bike (20-30 min), boflex machine- alternates days. Plays golf once a week, yard work  Family History: The patient's family history includes Heart Problems in his mother; Osteoporosis in his father; Other in his maternal grandfather.  Social History: former smoker, occassional ETOH  Labs: 03/27/20 TC 162, HDL 49, LDL 93, TG 114 (non medication)  Past Medical History:  Diagnosis Date  . Arthritis   . BPH (benign prostatic hyperplasia)   . Cor  triatriatum   . 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 Flat feet   . History of colon polyps   . History of gallstones   . History of hyperparathyroidism   . Melanoma of back ([place])   . MR (mitral regurgitation) 05/16/2018   Moderate noted on ECHO  . MVP (mitral valve prolapse) 05/16/2018   Noted on ECHO  . OSA on CPAP   . Osteopenia    resolved with medical therapy (calcium and fosamax)  . PFO (patent foramen ovale)   . Sensorineural hearing loss (SNHL) of both ears   . Wears hearing aid    both    Current Outpatient Medications on File Prior to Visit  Medication Sig Dispense Refill  . aspirin EC 325 MG tablet Take 1 tablet (325 mg total) by mouth 2 (two) times daily after a meal. Take x 1 month post op to decrease risk of blood clots. 60 tablet 0  . Cholecalciferol (D2000 ULTRA STRENGTH) 50 MCG (2000 UT) CAPS Take 2,000 Units by mouth daily.    . Cholecalciferol (VITAMIN D PO) Take 1 tablet by mouth every morning.    . Cyanocobalamin (VITAMIN B-12 PO) Take 1 tablet by mouth every morning.    . Miconazole Nitrate 2 % OINT Apply 1 application topically daily as needed.    . Multiple Minerals-Vitamins (CAL-MAG-ZINC-D PO) Take 1 tablet by mouth daily.    . Multiple Vitamin (MULTIVITAMIN)  capsule Take 1 capsule by mouth daily.    . Omega-3 Fatty Acids (OMEGA 3 PO) Take 1 tablet by mouth every morning.    Marland Kitchen oxyCODONE-acetaminophen (PERCOCET/ROXICET) 5-325 MG tablet Take 1-2 tablets by mouth every 6 (six) hours as needed for severe pain. 40 tablet 0  . tadalafil (CIALIS) 20 MG tablet Take 20 mg by mouth daily as needed for erectile dysfunction.    . terbinafine (LAMISIL) 250 MG tablet Crush 2 tablets in fungi-nail solution and apply once daily to affected toenails    . tiZANidine (ZANAFLEX) 2 MG tablet Take 1 tablet (2 mg total) by mouth every 8 (eight) hours as needed for muscle spasms. 40 tablet 0  . triamcinolone (KENALOG) 0.1 % paste Use as directed 1 application in the mouth or throat 2  (two) times daily as needed (mouth sores).    . Vitamin K, Phytonadione, 100 MCG TABS Take 100 mcg by mouth daily.      No current facility-administered medications on file prior to visit.    Allergies  Allergen Reactions  . Codeine Other (See Comments)    Headache.   . Erythromycin     Childhood allergy - unknown    Assessment/Plan:  1. Hyperlipidemia - LDL is above goal of <70. Discussed options of trying a third statin, PCSK9i, or Nexlizet. Pros and cons of each medications reviewed including LDL reduction, cardiovascular risk reduction and side effects. Patient would like to try PCSK9i. Injection technique was review thoroughly with patient. Patient demonstrated appropriate technique with teach back method. I will submit prior auth for Praluent 75mg  q 14 days. Once approved I will call into pharmacy and call patient with cost. If cost is a barrier, we will look into [place].  Diet was also discussed in detail and ways to reduce fat/sugar/carb intake was reviewed with patient.   Thank you,  Ramond Dial, Pharm.D, BCPS, CPP [place]  [id number] [place],  [address]  Phone: [phone]; Fax: [phone]

## 2020-04-01 NOTE — Patient Instructions (Addendum)
It was a pleasure to meet you!  I will submit a prior authorization for Praluent. I will call you with the cost once its approved.  Please feel free to call us at [phone] with any questions or concerns.  Try using olive oil instead of butter Reduce milk to 1% Try to limit ice cream to once or twice a week Avoid fried foods, including potatoe chips

## 2020-04-10 DIAGNOSIS — M5441 Lumbago with sciatica, right side: Secondary | ICD-10-CM | POA: Diagnosis not present

## 2020-04-10 DIAGNOSIS — M25551 Pain in right hip: Secondary | ICD-10-CM | POA: Diagnosis not present

## 2020-04-10 DIAGNOSIS — M455 Ankylosing spondylitis of thoracolumbar region: Secondary | ICD-10-CM | POA: Diagnosis not present

## 2020-04-21 DIAGNOSIS — M5416 Radiculopathy, lumbar region: Secondary | ICD-10-CM | POA: Diagnosis not present

## 2020-06-05 ENCOUNTER — Other Ambulatory Visit: Payer: Self-pay

## 2020-06-05 ENCOUNTER — Ambulatory Visit
Admission: RE | Admit: 2020-06-05 | Discharge: 2020-06-05 | Disposition: A | Discharge status: Home / Self Care | Payer: Medicare Other | Source: Ambulatory Visit | Attending: Family Medicine | Admitting: Family Medicine

## 2020-06-05 DIAGNOSIS — M8580 Other specified disorders of bone density and structure, unspecified site: Secondary | ICD-10-CM

## 2020-06-16 ENCOUNTER — Other Ambulatory Visit: Payer: Medicare Other | Admitting: *Deleted

## 2020-06-16 ENCOUNTER — Other Ambulatory Visit: Payer: Self-pay

## 2020-06-16 DIAGNOSIS — I639 Cerebral infarction, unspecified: Secondary | ICD-10-CM

## 2020-06-16 LAB — HEPATIC FUNCTION PANEL
ALT: 22 IU/L (ref 0–44)
AST: 25 IU/L (ref 0–40)
Albumin: 4.4 g/dL (ref 3.7–4.7)
Alkaline Phosphatase: 86 IU/L (ref 48–121)
Bilirubin Total: 1.2 mg/dL (ref 0.0–1.2)
Bilirubin, Direct: 0.29 mg/dL (ref 0.00–0.40)
Total Protein: 7.1 g/dL (ref 6.0–8.5)

## 2020-06-16 LAB — LIPID PANEL
Chol/HDL Ratio: 2.8 ratio (ref 0.0–5.0)
Cholesterol, Total: 141 mg/dL (ref 100–199)
HDL: 51 mg/dL (ref >39)
LDL Chol Calc (NIH): 62 mg/dL (ref 0–99)
Triglycerides: 167 mg/dL — ABNORMAL HIGH (ref 0–149)
VLDL Cholesterol Cal: 28 mg/dL (ref 5–40)

## 2020-06-20 DIAGNOSIS — M48061 Spinal stenosis, lumbar region without neurogenic claudication: Secondary | ICD-10-CM | POA: Diagnosis not present

## 2020-07-28 DIAGNOSIS — G4733 Obstructive sleep apnea (adult) (pediatric): Secondary | ICD-10-CM | POA: Diagnosis not present

## 2020-08-14 DIAGNOSIS — L570 Actinic keratosis: Secondary | ICD-10-CM | POA: Diagnosis not present

## 2020-08-14 DIAGNOSIS — L738 Other specified follicular disorders: Secondary | ICD-10-CM | POA: Diagnosis not present

## 2020-08-14 DIAGNOSIS — D1801 Hemangioma of skin and subcutaneous tissue: Secondary | ICD-10-CM | POA: Diagnosis not present

## 2020-08-14 DIAGNOSIS — L814 Other melanin hyperpigmentation: Secondary | ICD-10-CM | POA: Diagnosis not present

## 2020-08-14 DIAGNOSIS — L819 Disorder of pigmentation, unspecified: Secondary | ICD-10-CM | POA: Diagnosis not present

## 2020-08-14 DIAGNOSIS — Z85820 Personal history of malignant melanoma of skin: Secondary | ICD-10-CM | POA: Diagnosis not present

## 2020-08-14 DIAGNOSIS — D229 Melanocytic nevi, unspecified: Secondary | ICD-10-CM | POA: Diagnosis not present

## 2020-08-14 DIAGNOSIS — L905 Scar conditions and fibrosis of skin: Secondary | ICD-10-CM | POA: Diagnosis not present

## 2020-08-30 DIAGNOSIS — Z23 Encounter for immunization: Secondary | ICD-10-CM | POA: Diagnosis not present

## 2020-09-03 DIAGNOSIS — R972 Elevated prostate specific antigen [PSA]: Secondary | ICD-10-CM | POA: Diagnosis not present

## 2020-09-05 DIAGNOSIS — Z23 Encounter for immunization: Secondary | ICD-10-CM | POA: Diagnosis not present

## 2020-09-11 DIAGNOSIS — R972 Elevated prostate specific antigen [PSA]: Secondary | ICD-10-CM | POA: Diagnosis not present

## 2020-09-11 DIAGNOSIS — N400 Enlarged prostate without lower urinary tract symptoms: Secondary | ICD-10-CM | POA: Diagnosis not present

## 2020-09-19 ENCOUNTER — Other Ambulatory Visit: Payer: Self-pay | Admitting: Urology

## 2020-09-19 ENCOUNTER — Other Ambulatory Visit: Payer: Self-pay | Admitting: Emergency Medicine

## 2020-09-19 DIAGNOSIS — R972 Elevated prostate specific antigen [PSA]: Secondary | ICD-10-CM

## 2020-10-20 ENCOUNTER — Other Ambulatory Visit: Payer: Self-pay

## 2020-10-20 ENCOUNTER — Ambulatory Visit
Admission: RE | Admit: 2020-10-20 | Discharge: 2020-10-20 | Disposition: A | Discharge status: Home / Self Care | Payer: Medicare Other | Source: Ambulatory Visit | Attending: Urology | Admitting: Urology

## 2020-10-20 DIAGNOSIS — R972 Elevated prostate specific antigen [PSA]: Secondary | ICD-10-CM

## 2020-10-20 MED ORDER — GADOBENATE DIMEGLUMINE 529 MG/ML IV SOLN
17.0000 mL | Freq: Once | INTRAVENOUS | Status: AC | PRN
  Administered 2020-10-20: 17 mL via INTRAVENOUS

## 2021-01-29 NOTE — Progress Notes (Deleted)
Date:  01/29/2021   ID:  James Tate, DOB Jul 15, 1946, MRN [id number]  PCP:  Gaynelle Arabian, MD  Cardiologist:   Johnsie Cancel Electrophysiologist:  None   Evaluation Performed:  Follow-Up Visit  Chief Complaint:  TIA/MR  History of Present Illness:    75 y.o. . CVA Rx [place] April 2016. Acute right facial numbness with slurred speech. MRI, carotids and telemetry unrevealing Rx initially with ASA and statin Full recovery Subsequent TEE cor triatriatum and PFO  Seen by Dr Burt Knack who felt no closure indicated at that time unless recurrence ILR placed by Dr Rayann Heman 07/02/15. 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 Memory issues with crestor 04/01/20 started on Praluent LDL 62 06/16/20   ***  Past Medical History:  Diagnosis Date  . Arthritis   . BPH (benign prostatic hyperplasia)   . Cor triatriatum   . 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 Flat feet   . History of colon polyps   . History of gallstones   . History of hyperparathyroidism   . 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 MR (mitral regurgitation) 05/16/2018   Moderate noted on ECHO  . MVP (mitral valve prolapse) 05/16/2018   Noted on ECHO  . OSA on CPAP   . Osteopenia    resolved with medical therapy (calcium and fosamax)  . PFO (patent foramen ovale)   . Sensorineural hearing loss (SNHL) of both ears   . Wears hearing aid    both   Past Surgical History:   Procedure Laterality Date  . APPLICATION OF A-CELL OF BACK N/A 12/19/2014   Procedure: APPLICATION OF A-CELL OF BACK;  Surgeon: Theodoro Kos, DO;  Location: [place];  Service: Plastics;  Laterality: N/A;  . CHOLECYSTECTOMY  1996  . COLONOSCOPY    . EP IMPLANTABLE DEVICE N/A 07/02/2015   Procedure: Loop Recorder Insertion;  Surgeon: Thompson Grayer, MD;  Location: [place] CV LAB;  Service: Cardiovascular;  Laterality: N/A;  . implantable loop recorder removal  12/13/2018   MDT LINQ removed in office by Dr Rayann Heman  . LASIK  2003  . MELANOMA EXCISION N/A 10/04/2014   Procedure: WIDE LOCAL EXCISION OF UPPER BACK MELANOMA ;  Surgeon: Gayland Curry, MD;  [facility];  Service: General;  Laterality: N/A;  . PARATHYROIDECTOMY  2004  . TEE WITHOUT CARDIOVERSION N/A 06/12/2015   Procedure: TRANSESOPHAGEAL ECHOCARDIOGRAM (TEE);  Surgeon: Josue Hector, MD;  Location: [facility] ENDOSCOPY;  Service: Cardiovascular;  Laterality: N/A;  . TONSILLECTOMY    . TOTAL KNEE ARTHROPLASTY Left 12/22/2018   Procedure: TOTAL KNEE ARTHROPLASTY;  Surgeon: Dorna Leitz, MD;  [facility]: [facility];  Service: Orthopedics;  Laterality: Left;     No outpatient medications have been marked as taking for the 02/04/21 encounter (Appointment) with Josue Hector, MD.  Allergies:   Codeine and Erythromycin   Social History   Tobacco Use  . Smoking status: Former Smoker    Quit date: 09/27/1971    Years since quitting: 49.3  . Smokeless tobacco: Never Used  Vaping Use  . Vaping Use: Never used  Substance Use Topics  . Alcohol use: Yes    Comment: OCCASIONAL  . Drug use: No     Family Hx: The patient's family history includes Heart Problems in his mother; Osteoporosis in his father; Other in his maternal grandfather.  ROS:   Please see the history of present illness.     All other systems reviewed and are negative.   Prior CV studies:   The following studies were reviewed today:  Echo June  2019   Echo 08/02/19   Labs/Other Tests and Data Reviewed:    EKG:   SR rate 75 normal 02/05/20   Recent Labs: 06/16/2020: ALT 22   Recent Lipid Panel Lab Results  Component Value Date/Time   CHOL 141 06/16/2020 10:53 AM   TRIG 167 (H) 06/16/2020 10:53 AM   HDL 51 06/16/2020 10:53 AM   CHOLHDL 2.8 06/16/2020 10:53 AM   CHOLHDL 2.3 05/24/2016 10:32 AM   LDLCALC 62 06/16/2020 10:53 AM    Wt Readings from Last 3 Encounters:  02/05/20 88 kg  06/21/19 84.8 kg  12/22/18 85.3 kg     Objective:    Vital Signs:  There were no vitals taken for this visit.   Affect appropriate Healthy:  appears stated age 34: normal Neck supple with no adenopathy JVP normal no bruits no thyromegaly Lungs clear with no wheezing and good diaphragmatic motion Heart:  S1/S2 MR murmur, no rub, gallop or click PMI normal Abdomen: benighn, BS positve, no tenderness, no AAA no bruit.  No HSM or HJR Distal pulses intact with no bruits No edema Neuro non-focal Skin warm and dry Post left TKR    ASSESSMENT & PLAN:    1. CVA:  ASA no cause found PFO no closure per Dr Burt Knack ILR explanted no PAF 2. PFO:  See above echo 08/02/19 no PFO by color flow no bubble study done 3. MR:  With posterior leaflet prolapse moderate TTE June 2019 f/u echo 08/02/19 only mild MR  4. Prostate: f/u Dr Louis Meckel PSA historically elevated with negative biopsies  5. HLD:  Hold crestor ? Memory issues f/u lipid clinic 8 weeks if memory better ? Nexlitol  6. Ortho:  F/u Dr Berenice Primas post left TKR January 2020    COVID-19 Education: The signs and symptoms of COVID-19 were discussed with the patient and how to seek care for testing (follow up with PCP or arrange E-visit).  The importance of social distancing was discussed today.   Medication Adjustments/Labs and Tests Ordered: Current medicines are reviewed at length with the patient today.  Concerns regarding medicines are outlined above.   Tests Ordered:  Echo with bubble  study PFO/MVP MR   Medication Changes:  Hold Crestor   Disposition:  Follow up in a year if echo stable  Signed, Jenkins Rouge, MD  01/29/2021 12:15 PM    [place]

## 2021-02-04 ENCOUNTER — Ambulatory Visit: Payer: Medicare Other | Admitting: Cardiovascular Disease

## 2021-03-24 DIAGNOSIS — Z20822 Contact with and (suspected) exposure to covid-19: Secondary | ICD-10-CM | POA: Diagnosis not present

## 2021-03-31 ENCOUNTER — Other Ambulatory Visit: Payer: Self-pay | Admitting: Cardiovascular Disease

## 2021-03-31 DIAGNOSIS — Z23 Encounter for immunization: Secondary | ICD-10-CM | POA: Diagnosis not present

## 2021-04-01 DIAGNOSIS — H9190 Unspecified hearing loss, unspecified ear: Secondary | ICD-10-CM | POA: Diagnosis not present

## 2021-04-01 DIAGNOSIS — R413 Other amnesia: Secondary | ICD-10-CM | POA: Diagnosis not present

## 2021-04-01 DIAGNOSIS — Z8673 Personal history of transient ischemic attack (TIA), 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 residual deficits: Secondary | ICD-10-CM | POA: Diagnosis not present

## 2021-04-01 DIAGNOSIS — Z79899 Other long term (current) drug therapy: Secondary | ICD-10-CM | POA: Diagnosis not present

## 2021-04-01 DIAGNOSIS — G4733 Obstructive sleep apnea (adult) (pediatric): Secondary | ICD-10-CM | POA: Diagnosis not present

## 2021-04-01 DIAGNOSIS — Z85820 Personal history of malignant melanoma of skin: Secondary | ICD-10-CM | POA: Diagnosis not present

## 2021-04-01 DIAGNOSIS — Z1389 Encounter for screening for other disorder: Secondary | ICD-10-CM | POA: Diagnosis not present

## 2021-04-01 DIAGNOSIS — M8580 Other specified disorders of bone density and structure, unspecified site: Secondary | ICD-10-CM | POA: Diagnosis not present

## 2021-04-01 DIAGNOSIS — Z0000 Encounter for general adult medical examination without abnormal findings: Secondary | ICD-10-CM | POA: Diagnosis not present

## 2021-04-01 DIAGNOSIS — I700 Atherosclerosis of aorta: Secondary | ICD-10-CM | POA: Diagnosis not present

## 2021-04-01 DIAGNOSIS — E7800 Pure hypercholesterolemia, unspecified: Secondary | ICD-10-CM | POA: Diagnosis not present

## 2021-04-06 ENCOUNTER — Other Ambulatory Visit: Payer: Self-pay | Admitting: Family Medicine

## 2021-04-06 DIAGNOSIS — R17 Unspecified jaundice: Secondary | ICD-10-CM

## 2021-04-16 ENCOUNTER — Encounter: Payer: Self-pay | Admitting: Neurology

## 2021-04-22 ENCOUNTER — Other Ambulatory Visit: Payer: Medicare Other

## 2021-06-09 NOTE — Progress Notes (Signed)
Date:  06/09/2021   ID:  Janyth Contes, DOB December 23, 1945, MRN [id number]  PCP:  Gaynelle Arabian, MD  Cardiologist:   Johnsie Cancel Electrophysiologist:  None   Evaluation Performed:  Follow-Up Visit  Chief Complaint:  TIA/MR  History of Present Illness:    75 y.o. . CVA Rx [place] April 2016. Acute right facial numbness with slurred speech. MRI, carotids and telemetry unrevealing Rx initially with ASA and statin Full recovery Subsequent TEE cor triatriatum and PFO  Seen by Dr Burt Knack who felt no closure indicated at that time unless recurrence ILR placed by Dr Rayann Heman 07/02/15. 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   Flat feet    History of colon polyps    History of gallstones    History of hyperparathyroidism    Melanoma of back ([place])    MR (mitral regurgitation) 05/16/2018   Moderate noted on ECHO   MVP (mitral valve prolapse) 05/16/2018   Noted on ECHO   OSA on CPAP    Osteopenia    resolved with medical therapy (calcium and fosamax)   PFO (patent foramen ovale)    Sensorineural hearing loss (SNHL) of both ears    Wears hearing aid    both   Past Surgical History:  Procedure Laterality Date   APPLICATION  OF A-CELL OF BACK N/A 12/19/2014   Procedure: APPLICATION OF A-CELL OF BACK;  Surgeon: Theodoro Kos, DO;  Location: [facility];  Service: Plastics;  Laterality: N/A;   CHOLECYSTECTOMY  1996   COLONOSCOPY     EP IMPLANTABLE DEVICE N/A 07/02/2015   Procedure: Loop Recorder Insertion;  Surgeon: Thompson Grayer, MD;  Location: [facility];  Service: Cardiovascular;  Laterality: N/A;   implantable loop recorder removal  12/13/2018   MDT LINQ removed in office by Dr Rayann Heman   LASIK  2003   MELANOMA EXCISION N/A 10/04/2014   Procedure: WIDE LOCAL EXCISION OF UPPER BACK MELANOMA ;  Surgeon: Gayland Curry, MD;  [facility];  Service: General;  Laterality: N/A;   PARATHYROIDECTOMY  2004   TEE WITHOUT CARDIOVERSION N/A 06/12/2015   Procedure: TRANSESOPHAGEAL ECHOCARDIOGRAM (TEE);  Surgeon: Josue Hector, MD;  Location: [facility];  Service: Cardiovascular;  Laterality: N/A;   TONSILLECTOMY     TOTAL KNEE ARTHROPLASTY Left 12/22/2018   Procedure: TOTAL KNEE ARTHROPLASTY;  Surgeon: Dorna Leitz, MD;  [facility];  Service: Orthopedics;  Laterality: Left;     No outpatient medications have been marked as taking for the 06/17/21 encounter (Appointment) with Josue Hector, MD.     Allergies:  Codeine and Erythromycin   Social History   Tobacco Use   Smoking status: Former    Pack years: 0.00    Types: Cigarettes    Quit date: 09/27/1971    Years since quitting: 49.7   Smokeless tobacco: Never  Vaping Use   Vaping Use: Never used  Substance Use Topics   Alcohol use: Yes    Comment: OCCASIONAL   Drug use: No     Family Hx: The patient's family history includes Heart Problems in his mother; Osteoporosis in his father; Other in his maternal grandfather.  ROS:   Please see the history of present illness.     All other systems reviewed and are negative.   Prior CV studies:   The following studies were reviewed today:  Echo June 2019   Labs/Other Tests and  Data Reviewed:    EKG:  06/17/2021 SR rate 61 normal   Recent Labs: 06/16/2020: ALT 22   Recent Lipid Panel Lab Results  Component Value Date/Time   CHOL 141 06/16/2020 10:53 AM   TRIG 167 (H) 06/16/2020 10:53 AM   HDL 51 06/16/2020 10:53 AM   CHOLHDL 2.8 06/16/2020 10:53 AM   CHOLHDL 2.3 05/24/2016 10:32 AM   LDLCALC 62 06/16/2020 10:53 AM    Wt Readings from Last 3 Encounters:  02/05/20 88 kg  06/21/19 84.8 kg  12/22/18 85.3 kg     Objective:    Vital Signs:  There were no vitals taken for this visit.   Affect appropriate Healthy:  appears stated age 42: normal Neck supple with no adenopathy JVP normal no bruits no thyromegaly Lungs clear with no wheezing and good diaphragmatic motion Heart:  S1/S2 MR murmur, no rub, gallop or click PMI normal Abdomen: benighn, BS positve, no tenderness, no AAA no bruit.  No HSM or HJR Distal pulses intact with no bruits No edema Neuro non-focal Skin warm and dry Post left TKR    ASSESSMENT & PLAN:    1. CVA:  ASA no cause found PFO no closure per Dr Burt Knack ILR explanted no PAF 2. PFO:  See above echo 08/02/19 no PFO by color flow no bubble study done 3. MR:  With posterior leaflet prolapse moderate TTE June 2019 f/u echo 08/02/19 only mild MR  4. Prostate: f/u Dr Louis Meckel PSA historically elevated with negative biopsies  5. HLD:  Intolerant to statins with neuropathy and memory issues now on Praluent 03/31/21 LDL was 67 On labs done at Dr Lorre Nick' office 04/01/21 6. Ortho:  F/u Dr Berenice Primas post left TKR January 2020    COVID-19 Education: The signs and symptoms of COVID-19 were discussed with the patient and how to seek care for testing (follow up with PCP or arrange E-visit).  The importance of social distancing was discussed today.  Time:   Today, I have spent 30 minutes with the patient with telehealth technology discussing the above problems.     Medication Adjustments/Labs and Tests Ordered: Current medicines are  reviewed at length with the patient today.  Concerns regarding medicines are outlined above.   Tests Ordered:   Medication Changes:  None   Disposition:  Follow up in a year   Signed, Jenkins Rouge, MD  06/09/2021 12:27 PM    [place]

## 2021-06-12 DIAGNOSIS — Z20822 Contact with and (suspected) exposure to covid-19: Secondary | ICD-10-CM | POA: Diagnosis not present

## 2021-06-17 ENCOUNTER — Encounter: Payer: Self-pay | Admitting: Cardiovascular Disease

## 2021-06-17 ENCOUNTER — Ambulatory Visit (INDEPENDENT_AMBULATORY_CARE_PROVIDER_SITE_OTHER): Payer: Medicare Other | Admitting: Cardiovascular Disease

## 2021-06-17 ENCOUNTER — Other Ambulatory Visit: Payer: Self-pay

## 2021-06-17 VITALS — BP 122/72 | HR 61 | Ht 72.0 in | Wt 193.0 lb

## 2021-06-17 DIAGNOSIS — I341 Nonrheumatic mitral (valve) prolapse: Secondary | ICD-10-CM | POA: Diagnosis not present

## 2021-06-17 DIAGNOSIS — Z789 Other specified health status: Secondary | ICD-10-CM

## 2021-06-17 DIAGNOSIS — I639 Cerebral infarction, unspecified: Secondary | ICD-10-CM | POA: Diagnosis not present

## 2021-06-17 DIAGNOSIS — Q2112 Patent foramen ovale: Secondary | ICD-10-CM

## 2021-06-17 DIAGNOSIS — Q211 Atrial septal defect: Secondary | ICD-10-CM | POA: Diagnosis not present

## 2021-06-17 NOTE — Patient Instructions (Signed)
Medication Instructions:  Your physician recommends that you continue on your current medications as directed. Please refer to the Current Medication list given to you today.  *If you need a refill on your cardiac medications before your next appointment, please call your pharmacy*   Lab Work: Your physician recommends that you have lab work today. Fasting lipid and liver panel.  If you have labs (blood work) drawn today and your tests are completely normal, you will receive your results only by: [place] (if you have MyChart) OR A paper copy in the mail If you have any lab test that is abnormal or we need to change your treatment, we will call you to review the results.   Follow-Up: At [facility], you and your health needs are our priority.  As part of our continuing mission to provide you with exceptional heart care, we have created designated Provider Care Teams.  These Care Teams include your primary Cardiologist (physician) and Advanced Practice Providers (APPs -  Physician Assistants and Nurse Practitioners) who all work together to provide you with the care you need, when you need it.  We recommend signing up for the patient portal called "MyChart".  Sign up information is provided on this After Visit Summary.  MyChart is used to connect with patients for Virtual Visits (Telemedicine).  Patients are able to view lab/test results, encounter notes, upcoming appointments, etc.  Non-urgent messages can be sent to your provider as well.   To learn more about what you can do with MyChart, go to [email or web address].    Your next appointment:   1 year(s)  The format for your next appointment:   In Person  Provider:   You may see Jenkins Rouge, MD or one of the following Advanced Practice Providers on your designated Care Team:   Cecilie Kicks, NP

## 2021-06-18 LAB — HEPATIC FUNCTION PANEL
ALT: 29 IU/L (ref 0–44)
AST: 34 IU/L (ref 0–40)
Albumin: 4.8 g/dL — ABNORMAL HIGH (ref 3.7–4.7)
Alkaline Phosphatase: 91 IU/L (ref 44–121)
Bilirubin Total: 1.5 mg/dL — ABNORMAL HIGH (ref 0.0–1.2)
Bilirubin, Direct: 0.32 mg/dL (ref 0.00–0.40)
Total Protein: 7.5 g/dL (ref 6.0–8.5)

## 2021-06-18 LAB — LIPID PANEL
Chol/HDL Ratio: 2.7 ratio (ref 0.0–5.0)
Cholesterol, Total: 151 mg/dL (ref 100–199)
HDL: 55 mg/dL (ref >39)
LDL Chol Calc (NIH): 67 mg/dL (ref 0–99)
Triglycerides: 174 mg/dL — ABNORMAL HIGH (ref 0–149)
VLDL Cholesterol Cal: 29 mg/dL (ref 5–40)

## 2021-06-19 ENCOUNTER — Telehealth: Payer: Self-pay

## 2021-06-19 DIAGNOSIS — I639 Cerebral infarction, unspecified: Secondary | ICD-10-CM

## 2021-06-19 DIAGNOSIS — Z789 Other specified health status: Secondary | ICD-10-CM

## 2021-06-19 NOTE — Telephone Encounter (Signed)
-----   Message from Josue Hector, MD sent at 06/18/2021  7:26 AM EDT ----- Cholesterol is at goal and LFT's are normal F/U labs in 6 months

## 2021-06-19 NOTE — Telephone Encounter (Signed)
Will order repeat lab work for 6 months.

## 2021-07-06 ENCOUNTER — Ambulatory Visit: Payer: Medicare Other | Admitting: Neurology

## 2021-07-13 DIAGNOSIS — Z20822 Contact with and (suspected) exposure to covid-19: Secondary | ICD-10-CM | POA: Diagnosis not present

## 2021-08-03 DIAGNOSIS — G4733 Obstructive sleep apnea (adult) (pediatric): Secondary | ICD-10-CM | POA: Diagnosis not present

## 2021-08-05 ENCOUNTER — Other Ambulatory Visit: Payer: Self-pay

## 2021-08-05 ENCOUNTER — Ambulatory Visit (INDEPENDENT_AMBULATORY_CARE_PROVIDER_SITE_OTHER): Payer: Medicare Other | Admitting: Neurology

## 2021-08-05 ENCOUNTER — Encounter: Payer: Self-pay | Admitting: Neurology

## 2021-08-05 VITALS — BP 107/64 | HR 78 | Ht 72.0 in | Wt 194.2 lb

## 2021-08-05 DIAGNOSIS — I639 Cerebral infarction, unspecified: Secondary | ICD-10-CM

## 2021-08-05 DIAGNOSIS — R413 Other amnesia: Secondary | ICD-10-CM

## 2021-08-05 NOTE — Patient Instructions (Signed)
Good to meet you!  Bloodwork for TSH, B12  2. Schedule MRI brain with and without contrast  3. Follow-up in 6 months, call for any changes   RECOMMENDATIONS FOR ALL PATIENTS WITH MEMORY PROBLEMS: 1. Continue to exercise (Recommend 30 minutes of walking everyday, or 3 hours every week) 2. Increase social interactions - continue going to [place] and enjoy social gatherings with friends and family 3. Eat healthy, avoid fried foods and eat more fruits and vegetables 4. Maintain adequate blood pressure, blood sugar, and blood cholesterol level. Reducing the risk of stroke and cardiovascular disease also helps promoting better memory. 5. Avoid stressful situations. Live a simple life and avoid aggravations. Organize your time and prepare for the next day in anticipation. 6. Sleep well, avoid any interruptions of sleep and avoid any distractions in the bedroom that may interfere with adequate sleep quality 7. Avoid sugar, avoid sweets as there is a strong link between excessive sugar intake, diabetes, and cognitive impairment We discussed the Mediterranean diet, which has been shown to help patients reduce the risk of progressive memory disorders and reduces cardiovascular risk. This includes eating fish, eat fruits and green leafy vegetables, nuts like almonds and hazelnuts, walnuts, and also use olive oil. Avoid fast foods and fried foods as much as possible. Avoid sweets and sugar as sugar use has been linked to worsening of memory function.

## 2021-08-05 NOTE — Progress Notes (Signed)
NEUROLOGY CONSULTATION NOTE  [name] MRN: [id number] DOB: 1946-10-03  Referring provider: Dr. Gaynelle Arabian Primary care provider: Dr. Gaynelle Arabian  Reason for consult:  memory loss  Dear Dr Marisue Humble:  Thank you for your kind referral of James Tate for consultation of the above symptoms. Although his history is well known to you, please allow me to reiterate it for the purpose of our medical record. The patient was accompanied to the clinic by his wife James Tate who also provides collateral information. Records and images were personally reviewed where available.   HISTORY OF PRESENT ILLNESS: This is a pleasant 75 year old right-handed man with a history of hyperlipidemia, right parietal stroke in 2016 with no residual focal deficits, malignant melanoma, OSA on CPAP, presenting for evaluation of memory loss. His wife James Tate is present to provide additional history. He feels his memory is okay, thinks it could be better. Sometimes he cannot remember things from year ago, or yesterday. He and his wife started noticing changes after his stroke in 2016. He was at [facility] at that time, records unavailable for review, he had left-sided weakness that resolved. James Tate notes that he was having a little memory change prior to the stroke such as inability to recall much of his childhood or their dating/marriage, but it became more pronounced that he would forget what she had said. He has always "not a conversation initiator," but he would not recall parts of their decision-making conversations or that he had seen a TV show 3 days prior. He denies getting lost driving, his wife denies any driving concerns. He denies forgetting medications regularly, occasionally he may forget his aspirin. He denies missing bill payments, James Tate notes he is very structured. He denies leaving the stove on or misplacing things regularly (except his glasses sometimes). He states his mood is pretty good. James Tate notes he is very  calm and "low-key," a little more pronounced now that sometimes she wonders if he is a little depressed. No paranoia or hallucinations. Sleep is overall good with his CPAP, he has rare times that he would "bop" her in his sleep, 1-2 times a year. James Tate has noticed he has tremors when writing, but none when drinking from a cup or using utensils. He denies any headaches, dizziness, diplopia, dysarthria, dysphagia, neck/back pain, focal numbness/tingling/weakness, bowel/bladder dysfunction, anosmia, no falls. 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 His older sister has dementia, his mother had memory issues. He denies any significant head injuries. He drinks alcohol occasionally. He is pretty active and plays golf.   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   Flat feet    History of colon polyps    History of gallstones    History of hyperparathyroidism    Melanoma of back ([place])    MR (mitral regurgitation) 05/16/2018   Moderate noted on ECHO   MVP (mitral valve prolapse) 05/16/2018   Noted on ECHO   OSA on CPAP    Osteopenia    resolved with medical therapy (calcium and fosamax)   PFO (patent foramen ovale)    Sensorineural hearing loss (SNHL) of both ears    Wears hearing aid    both    PAST SURGICAL HISTORY: Past Surgical History:  Procedure Laterality Date   APPLICATION OF A-CELL OF BACK N/A 12/19/2014   Procedure: APPLICATION OF A-CELL OF  BACK;  Surgeon: Theodoro Kos, DO;  Location: [facility];  Service: Plastics;  Laterality: N/A;   CHOLECYSTECTOMY  1996   COLONOSCOPY     EP IMPLANTABLE DEVICE N/A 07/02/2015   Procedure: Loop Recorder Insertion;  Surgeon: Thompson Grayer, MD;  Location: [facility];  Service: Cardiovascular;  Laterality: N/A;   implantable loop recorder removal  12/13/2018   MDT LINQ removed in office by Dr Rayann Heman   LASIK  2003    MELANOMA EXCISION N/A 10/04/2014   Procedure: WIDE LOCAL EXCISION OF UPPER BACK MELANOMA ;  Surgeon: Gayland Curry, MD;  [facility];  Service: General;  Laterality: N/A;   PARATHYROIDECTOMY  2004   TEE WITHOUT CARDIOVERSION N/A 06/12/2015   Procedure: TRANSESOPHAGEAL ECHOCARDIOGRAM (TEE);  Surgeon: Josue Hector, MD;  Location: [facility];  Service: Cardiovascular;  Laterality: N/A;   TONSILLECTOMY     TOTAL KNEE ARTHROPLASTY Left 12/22/2018   Procedure: TOTAL KNEE ARTHROPLASTY;  Surgeon: Dorna Leitz, MD;  [facility];  Service: Orthopedics;  Laterality: Left;    MEDICATIONS: Current Outpatient Medications on File Prior to Visit  Medication Sig Dispense Refill   aspirin EC 81 MG tablet Take 81 mg by mouth daily.     Cholecalciferol (D2000 ULTRA STRENGTH) 50 MCG (2000 UT) CAPS Take 2,000 Units by mouth daily.     Cyanocobalamin (VITAMIN B-12 PO) Take 1 tablet by mouth every morning.     Miconazole Nitrate 2 % OINT Apply 1 application topically daily as needed.     Multiple Minerals-Vitamins (CAL-MAG-ZINC-D PO) Take 1 tablet by mouth daily.     Multiple Vitamin (MULTIVITAMIN) capsule Take 1 capsule by mouth daily.     Omega-3 Fatty Acids (OMEGA 3 PO) Take 1 tablet by mouth every morning.     PRALUENT 75 MG/ML SOAJ INJECT 1 PEN INTO THE SKIN EVERY 14 DAYS 2 mL 11   tadalafil (CIALIS) 20 MG tablet Take 20 mg by mouth daily as needed for erectile dysfunction.     triamcinolone (KENALOG) 0.1 % paste Use as directed 1 application in the mouth or throat 2 (two) times daily as needed (mouth sores).     Vitamin K, Phytonadione, 100 MCG TABS Take 100 mcg by mouth daily.      No current facility-administered medications on file prior to visit.    ALLERGIES: Allergies  Allergen Reactions   Codeine Other (See Comments)    Headache.    Erythromycin     Childhood allergy - unknown    FAMILY HISTORY: Family History  Problem Relation Age of Onset   Heart Problems Mother         valve replacement & pacemaker   Osteoporosis Father    Other Maternal Grandfather        poss MI, per pt    SOCIAL HISTORY: Social History   Socioeconomic History   Marital status: Married    Spouse name: Not on file   Number of children: Not on file   Years of education: Not on file   Highest education level: Not on file  Occupational History   Not on file  Tobacco Use   Smoking status: Former    Types: Cigarettes    Quit date: 09/27/1971    Years since quitting: 49.8   Smokeless tobacco: Never  Vaping Use   Vaping Use: Never used  Substance and Sexual Activity   Alcohol use: Yes    Alcohol/week: 2.0 standard drinks    Types:  2 Glasses of wine per week    Comment: about 2-3 glasses a week   Drug use: No   Sexual activity: Not on file  Other Topics Concern   Not on file  Social History Narrative   Pt lives in [place] with spouse.  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  Right handed    Social Determinants of Health   Financial Resource Strain: Not on file  Food Insecurity: Not on file  Transportation Needs: Not on file  Physical Activity: Not on file  Stress: Not on file  Social Connections: Not on file  Intimate Partner Violence: Not on file     PHYSICAL EXAM: Vitals:   08/05/21 1033  BP: 107/64  Pulse: 78  SpO2: 95%   General: No acute distress Head:  Normocephalic/atraumatic Skin/Extremities: No rash, no edema Neurological Exam: Mental status: alert and oriented to person, place, and time, no dysarthria or aphasia, Fund of knowledge is appropriate.  Recent and remote memory are intact.  Attention and concentration are normal.    Able to name objects and repeat phrases.  Montreal Cognitive Assessment  08/05/2021  Visuospatial/ Executive (0/5) 5  Naming (0/3) 3  Attention: Read list of digits (0/2) 2  Attention: Read list of letters (0/1) 1  Attention: Serial 7 subtraction starting at 100 (0/3) 3  Language: Repeat phrase (0/2) 2  Language :  Fluency (0/1) 1  Abstraction (0/2) 1  Delayed Recall (0/5) 4  Orientation (0/6) 6  Total 28  Adjusted Score (based on education) 28    Cranial nerves: CN I: not tested CN II: pupils equal, round and reactive to light, visual fields intact CN III, IV, VI:  full range of motion, no nystagmus, no ptosis CN V: facial sensation intact CN VII: upper and lower face symmetric CN VIII: hearing intact to conversation CN XI: sternocleidomastoid and trapezius muscles intact CN XII: tongue midline Bulk & Tone: normal, no fasciculations, no cogwheeling Motor: 5/5 throughout with no pronator drift. Sensation: intact to light touch, cold, pin, vibration sense.  No extinction to double simultaneous stimulation.  Romberg test slight sway Deep Tendon Reflexes: +2 throughout Cerebellar: no incoordination on finger to nose testing Gait: narrow-based and steady, able to tandem walk adequately. Tremor: none today Good finger taps.   IMPRESSION: This is a pleasant 75 year old right-handed man with a history of hyperlipidemia, right parietal stroke in 2016 with no residual focal deficits, malignant melanoma, OSA on CPAP, presenting for evaluation of memory loss. His neurological exam is normal, MOCA score today normal 28/30. They started noticing memory changes after his stroke, we discussed vascular cognitive changes, but at this point he is overall functioning well with no difficulties with complex tasks. We discussed different causes of memory loss, check TSH, B12. MRI brain with and without contrast will be ordered to assess for underlying structural abnormality and assess vascular load. No clear indication to start cholinesterase inhibitors at this time. Continue aspirin for secondary stroke prevention. We discussed the importance of  control of vascular risk factors, physical exercise, and brain stimulation exercises for brain health. Follow-up in 6 months, they know to call for any changes.    Thank you  for allowing me to participate in the care of this patient. Please do not hesitate to call for any questions or concerns.   Ellouise Newer, M.D.  CC: Dr. Marisue Humble

## 2021-08-07 ENCOUNTER — Other Ambulatory Visit: Payer: Self-pay

## 2021-08-07 ENCOUNTER — Ambulatory Visit
Admission: RE | Admit: 2021-08-07 | Discharge: 2021-08-07 | Disposition: A | Discharge status: Home / Self Care | Payer: Medicare Other | Source: Ambulatory Visit | Attending: Neurology | Admitting: Neurology

## 2021-08-07 ENCOUNTER — Other Ambulatory Visit (INDEPENDENT_AMBULATORY_CARE_PROVIDER_SITE_OTHER): Payer: Medicare Other

## 2021-08-07 DIAGNOSIS — R413 Other amnesia: Secondary | ICD-10-CM

## 2021-08-07 DIAGNOSIS — I639 Cerebral infarction, unspecified: Secondary | ICD-10-CM | POA: Diagnosis not present

## 2021-08-07 DIAGNOSIS — I6782 Cerebral ischemia: Secondary | ICD-10-CM | POA: Diagnosis not present

## 2021-08-07 DIAGNOSIS — G319 Degenerative disease of nervous system, unspecified: Secondary | ICD-10-CM | POA: Diagnosis not present

## 2021-08-07 LAB — TSH: TSH: 3.37 u[IU]/mL (ref 0.35–5.50)

## 2021-08-07 LAB — VITAMIN B12: Vitamin B-12: 1039 pg/mL — ABNORMAL HIGH (ref 211–911)

## 2021-08-07 MED ORDER — GADOBENATE DIMEGLUMINE 529 MG/ML IV SOLN
20.0000 mL | Freq: Once | INTRAVENOUS | Status: AC | PRN
  Administered 2021-08-07: 20 mL via INTRAVENOUS

## 2021-08-14 ENCOUNTER — Telehealth: Payer: Self-pay

## 2021-08-14 NOTE — Telephone Encounter (Signed)
-----   Message from Cameron Sprang, MD sent at 08/07/2021  1:02 PM EDT ----- Pls let him know the brain MRI looked good, no evidence of tumor, stroke, or bleed. It showed age-related changes. There were changes seen on the parotid gland in his right cheek area, the radiologist recommended an ENT evaluation. If he would like to talk to his PCP or want [place] to refer him to ENT, pls let us know. Thanks

## 2021-08-14 NOTE — Telephone Encounter (Signed)
Pt called and informed brain MRI looked good, no evidence of tumor, stroke, or bleed. It showed age-related changes. There were changes seen on the parotid gland in his right cheek area, the radiologist recommended an ENT evaluation. Pt has an ENT. Pt also informed that lab work is normal. Pt asked for MRI results to be faxed to PCP

## 2021-08-24 DIAGNOSIS — Z23 Encounter for immunization: Secondary | ICD-10-CM | POA: Diagnosis not present

## 2021-09-10 DIAGNOSIS — L821 Other seborrheic keratosis: Secondary | ICD-10-CM | POA: Diagnosis not present

## 2021-09-10 DIAGNOSIS — L814 Other melanin hyperpigmentation: Secondary | ICD-10-CM | POA: Diagnosis not present

## 2021-09-10 DIAGNOSIS — L738 Other specified follicular disorders: Secondary | ICD-10-CM | POA: Diagnosis not present

## 2021-09-10 DIAGNOSIS — Z85820 Personal history of malignant melanoma of skin: Secondary | ICD-10-CM | POA: Diagnosis not present

## 2021-09-10 DIAGNOSIS — L84 Corns and callosities: Secondary | ICD-10-CM | POA: Diagnosis not present

## 2021-09-10 DIAGNOSIS — D225 Melanocytic nevi of trunk: Secondary | ICD-10-CM | POA: Diagnosis not present

## 2021-09-10 DIAGNOSIS — Z08 Encounter for follow-up examination after completed treatment for malignant neoplasm: Secondary | ICD-10-CM | POA: Diagnosis not present

## 2021-09-10 DIAGNOSIS — L819 Disorder of pigmentation, unspecified: Secondary | ICD-10-CM | POA: Diagnosis not present

## 2021-10-15 DIAGNOSIS — R972 Elevated prostate specific antigen [PSA]: Secondary | ICD-10-CM | POA: Diagnosis not present

## 2021-10-19 DIAGNOSIS — N400 Enlarged prostate without lower urinary tract symptoms: Secondary | ICD-10-CM | POA: Diagnosis not present

## 2021-10-19 DIAGNOSIS — R972 Elevated prostate specific antigen [PSA]: Secondary | ICD-10-CM | POA: Diagnosis not present

## 2021-10-22 ENCOUNTER — Other Ambulatory Visit: Payer: Self-pay | Admitting: Urology

## 2021-10-22 DIAGNOSIS — R972 Elevated prostate specific antigen [PSA]: Secondary | ICD-10-CM

## 2021-11-18 ENCOUNTER — Ambulatory Visit
Admission: RE | Admit: 2021-11-18 | Discharge: 2021-11-18 | Disposition: A | Discharge status: Home / Self Care | Payer: Medicare Other | Source: Ambulatory Visit | Attending: Urology | Admitting: Urology

## 2021-11-18 ENCOUNTER — Other Ambulatory Visit: Payer: Self-pay

## 2021-11-18 DIAGNOSIS — K5730 Diverticulosis of large intestine without perforation or abscess without bleeding: Secondary | ICD-10-CM | POA: Diagnosis not present

## 2021-11-18 DIAGNOSIS — N3289 Other specified disorders of bladder: Secondary | ICD-10-CM | POA: Diagnosis not present

## 2021-11-18 DIAGNOSIS — R972 Elevated prostate specific antigen [PSA]: Secondary | ICD-10-CM | POA: Diagnosis not present

## 2021-11-18 DIAGNOSIS — N402 Nodular prostate without lower urinary tract symptoms: Secondary | ICD-10-CM | POA: Diagnosis not present

## 2021-11-18 MED ORDER — GADOBENATE DIMEGLUMINE 529 MG/ML IV SOLN
17.0000 mL | Freq: Once | INTRAVENOUS | Status: AC | PRN
  Administered 2021-11-18: 15:00:00 17 mL via INTRAVENOUS

## 2021-12-11 ENCOUNTER — Telehealth: Payer: Self-pay | Admitting: *Deleted

## 2021-12-11 NOTE — Telephone Encounter (Signed)
James Tate. [name] 76 year old male is requesting preoperative cardiac clearance for MRI fusion biopsy.  He was last seen in the clinic on 06/17/2021.  During that time he continued to do well.  His follow-up echocardiogram 08/02/2019 showed an EF of 60-65% with mild MR.  Follow-up was planned for 1 year.  His PMH includes CVA, PFO (no closure recommended), ILR explanted with no PAF noted, hyperlipidemia, MR, and BPH.  May his aspirin be held for 5 days prior to his procedure?  Thank you for your help.  Please direct your response to CV DIV preop pool.  Jossie Ng. Alana Dayton NP-C    12/11/2021, 4:38 PM [address] Office [phone] Fax [phone]

## 2021-12-11 NOTE — Telephone Encounter (Signed)
° °  Pre-operative Risk Assessment    Patient Name: James Tate  DOB: [birth year] MRN: [id number]      Request for Surgical Clearance    Procedure:   MRI FUSION Bx  Date of Surgery:  Clearance 02/11/22                                 Surgeon:  DR. Ellison Hughs Surgeon's Group or Practice Name:  [facility] Phone number:  [phone] Fax number:  [phone]   Type of Clearance Requested:   - Medical  - Pharmacy:  Hold Aspirin x 5 DAYS PRIOR   Type of Anesthesia:  Not Indicated   Additional requests/questions:    [name]   12/11/2021, 4:17 PM

## 2021-12-14 NOTE — Telephone Encounter (Signed)
Patient was returning call. Please advise ?

## 2021-12-14 NOTE — Telephone Encounter (Signed)
Left message to call back at 8:01 AM on 12/14/2021.  Patient needs call back.

## 2021-12-14 NOTE — Telephone Encounter (Signed)
° °  Primary Cardiologist: Jenkins Rouge, MD  Chart reviewed as part of pre-operative protocol coverage. Given past medical history and time since last visit, based on ACC/AHA guidelines, James Tate would be at acceptable risk for the planned procedure without further cardiovascular testing.   His aspirin may be held for 5 days prior to his procedure.  Please resume as soon as hemostasis is achieved.  Patient was advised that if he develops new symptoms prior to surgery to contact our office to arrange a follow-up appointment.  He verbalized understanding.  I will route this recommendation to the requesting party via Epic fax function and remove from pre-op pool.  Please call with questions.  Jossie Ng. Shamarr Faucett NP-C    12/14/2021, 12:21 PM James Tate [address] Office [phone] Fax [phone]

## 2022-02-09 ENCOUNTER — Ambulatory Visit (INDEPENDENT_AMBULATORY_CARE_PROVIDER_SITE_OTHER): Payer: Medicare Other | Admitting: Physician Assistant

## 2022-02-09 ENCOUNTER — Other Ambulatory Visit: Payer: Self-pay

## 2022-02-09 ENCOUNTER — Encounter: Payer: Self-pay | Admitting: Physician Assistant

## 2022-02-09 VITALS — BP 127/73 | HR 82 | Resp 20 | Ht 72.0 in | Wt 197.0 lb

## 2022-02-09 DIAGNOSIS — R413 Other amnesia: Secondary | ICD-10-CM

## 2022-02-09 DIAGNOSIS — I639 Cerebral infarction, unspecified: Secondary | ICD-10-CM

## 2022-02-09 NOTE — Patient Instructions (Signed)
Good to meet you! ? ?3. Follow-up in 6 months, call for any changes. No indication for memory medicines at this time ? ? ?RECOMMENDATIONS FOR ALL PATIENTS WITH MEMORY PROBLEMS: ?1. Continue to exercise (Recommend 30 minutes of walking everyday, or 3 hours every week) ?2. Increase social interactions - continue going to [place] and enjoy social gatherings with friends and family ?3. Eat healthy, avoid fried foods and eat more fruits and vegetables ?4. Maintain adequate blood pressure, blood sugar, and blood cholesterol level. Reducing the risk of stroke and cardiovascular disease also helps promoting better memory. ?5. Avoid stressful situations. Live a simple life and avoid aggravations. Organize your time and prepare for the next day in anticipation. ?6. Sleep well, avoid any interruptions of sleep and avoid any distractions in the bedroom that may interfere with adequate sleep quality ?7. Avoid sugar, avoid sweets as there is a strong link between excessive sugar intake, diabetes, and cognitive impairment ?We discussed the Mediterranean diet, which has been shown to help patients reduce the risk of progressive memory disorders and reduces cardiovascular risk. This includes eating fish, eat fruits and green leafy vegetables, nuts like almonds and hazelnuts, walnuts, and also use olive oil. Avoid fast foods and fried foods as much as possible. Avoid sweets and sugar as sugar use has been linked to worsening of memory function. ? ? ?

## 2022-02-09 NOTE — Progress Notes (Signed)
Assessment/Plan:   Memory difficulties   Recommendations:  Discussed safety both in and out of the home.  Discussed the importance of regular daily schedule with inclusion of crossword puzzles to maintain brain function.  Continue to monitor mood by PCP Stay active at least 30 minutes at least 3 times a week.  Naps should be scheduled and should be no longer than 60 minutes and should not occur after 2 PM.  Mediterranean diet is recommended  Control cardiovascular risk factors, continue daily aspirin for stroke prevention. Continue donepezil 10 mg daily Side effects were discussed Follow up in 6  months.  The patient has already an appointment with Dr. Delice Lesch in August 2023, will keep that appointment for now.   Case discussed with Dr. Delice Lesch who agrees with the plan   Subjective:    James Tate is a very pleasant 76 y.o. 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 His neurological exam is normal, MOCA score today normal 28/30. They started noticing memory changes after his stroke, we discussed vascular cognitive changes, but at this point he is overall functioning well with no difficulties with complex tasks.  He is seen today in follow-up.  This patient is accompanied in the office by his wife who supplements the history.  Previous records as well as any outside records available were reviewed prior to todays visit.  Patient was last seen at our office on 08/05/2021 .  He is not on antidementia medications. In today's visit, his main complaint is memory trouble remembering some recent events, and to a certain extent some long-term memory being affected.  "I am not sure if my decreasing hearing has something to do with it, and I have an appointment soon with my ear doctor ".  He also has some difficulties remembering having watched a show for example  in [employer].  He denies getting lost driving, he is wife denies any driving concerns.  He denies forgetting to take medications.  He is in charge of his finances, denies missing any bill payments.  He denies leaving the stove on, or misplacing things regularly.  His mood is good, he continues to be very calm and "low-key ".  He denies any depression.  He denies paranoia or hallucinations.  His sleep is overall good with the CPAP.  He has known bilateral very mild tremors when writing, but not when drinking from a cup or using a pencils or dropping objects.  He denies any headaches, dizziness, diplopia, dysarthria, dysphagia, neck/back pain, focal numbness/tingling/weakness, bowel/bladder dysfunction, anosmia, no falls.  He continues to be active playing golf.    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 His wife [name] is present to provide additional history. He feels his memory is okay, thinks it could be better. Sometimes he cannot remember things from year ago, or yesterday. He and his wife started noticing changes after his stroke in 2016. He was at [facility] at that time, records unavailable for review, he had left-sided weakness that resolved. [name] notes that he was having a little memory change prior to the stroke such as inability to recall much of his childhood or their dating/marriage, but it became more pronounced  that he would forget what she had said. He has always "not a conversation initiator," but he would not recall parts of their decision-making conversations or that he had seen a TV show 3 days prior. He denies getting lost driving, his wife denies any driving concerns. He denies forgetting medications regularly, occasionally he may forget his aspirin. He denies missing bill payments, [name] notes he  is very structured. He denies leaving the stove on or misplacing things regularly (except his glasses sometimes). He states his mood is pretty good. [name] notes he is very calm and "low-key," a little more pronounced now that sometimes she wonders if he is a little depressed. No paranoia or hallucinations. Sleep is overall good with his CPAP, he has rare times that he would "bop" her in his sleep, 1-2 times a year. [name] has noticed he has tremors when writing, but none when drinking from a cup or using utensils. He denies any headaches, dizziness, diplopia, dysarthria, dysphagia, neck/back pain, focal numbness/tingling/weakness, bowel/bladder dysfunction, anosmia, no falls. 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 His older sister has dementia, his mother had memory issues. He denies any significant head injuries. He drinks alcohol occasionally. He is pretty active and plays golf.     Labs 08/07/21 TSH 3.37, B12 1000  PREVIOUS MEDICATIONS:   CURRENT MEDICATIONS:  Outpatient Encounter Medications as of 02/09/2022  Medication Sig   aspirin EC 81 MG tablet Take 81 mg by mouth daily.   Cholecalciferol (D2000 ULTRA STRENGTH) 50 MCG (2000 UT) CAPS Take 2,000 Units by mouth daily.   Cyanocobalamin (VITAMIN B-12 PO) Take 1 tablet by mouth every morning.   Miconazole Nitrate 2 % OINT Apply 1 application topically daily as needed.   Multiple Minerals-Vitamins (CAL-MAG-ZINC-D PO) Take 1 tablet by mouth daily.   Multiple Vitamin (MULTIVITAMIN) capsule Take 1 capsule by mouth daily.   Omega-3 Fatty Acids (OMEGA 3 PO) Take 1 tablet by mouth every morning.   PRALUENT 75 MG/ML SOAJ INJECT 1 PEN INTO THE SKIN EVERY 14 DAYS   tadalafil (CIALIS) 20 MG tablet Take 20 mg by mouth daily as needed for erectile dysfunction.   triamcinolone (KENALOG) 0.1 % paste Use as directed 1 application in the mouth or throat 2 (two) times daily as needed (mouth sores).   Vitamin K, Phytonadione, 100 MCG TABS Take 100 mcg by mouth daily.    No  facility-administered encounter medications on file as of 02/09/2022.     Objective:      PHYSICAL EXAMINATION:    VITALS:   Vitals:   02/09/22 1108  BP: 127/73  Pulse: 82  Resp: 20  SpO2: 95%  Weight: 197 lb (89.4 kg)  Height: 6' (1.829 m)    GEN:  The patient appears stated age and is in NAD. HEENT:  Normocephalic, atraumatic.   Neurological examination:  General: NAD, well-groomed, appears stated age. Orientation: The patient is alert. Oriented to person, place and date.  Delayed recall 3/3, orientation 5 out of 5 Cranial nerves: There is good facial symmetry.The speech is fluent and clear. No aphasia or dysarthria. Fund of knowledge is appropriate. Recent and remote memory are impaired. Attention and concentration are reduced.  Able to name objects and repeat phrases.  Hearing is intact to conversational tone.    Sensation: Sensation is intact to light touch throughout Motor: Strength is at least antigravity x4. Tremors: none  DTR's 2/4 in [facility] Cognitive Assessment  08/05/2021  Visuospatial/ Executive (0/5) 5  Naming (  0/3) 3  Attention: Read list of digits (0/2) 2  Attention: Read list of letters (0/1) 1  Attention: Serial 7 subtraction starting at 100 (0/3) 3  Language: Repeat phrase (0/2) 2  Language : Fluency (0/1) 1  Abstraction (0/2) 1  Delayed Recall (0/5) 4  Orientation (0/6) 6  Total 28  Adjusted Score (based on education) 28   No flowsheet data found.  No flowsheet data found.     Movement examination: Tone: There is normal tone in the UE/LE Abnormal movements:  no tremor.  No myoclonus.  No asterixis.   Coordination:  There is no decremation with RAM's. Normal finger to nose  Gait and Station: The patient has no difficulty arising out of a deep-seated chair without the use of the hands. The patient's stride length is good.  Gait is cautious and narrow.     Total time spent on today's visit was 30 minutes, including both face-to-face  time and nonface-to-face time. Time included that spent on review of records (prior notes available to me/labs/imaging if pertinent), discussing treatment and goals, answering patient's questions and coordinating care.  Cc:  Gaynelle Arabian, MD Sharene Butters, PA-C

## 2022-02-11 DIAGNOSIS — R972 Elevated prostate specific antigen [PSA]: Secondary | ICD-10-CM | POA: Diagnosis not present

## 2022-02-11 DIAGNOSIS — C61 Malignant neoplasm of prostate: Secondary | ICD-10-CM | POA: Diagnosis not present

## 2022-02-22 DIAGNOSIS — C61 Malignant neoplasm of prostate: Secondary | ICD-10-CM | POA: Diagnosis not present

## 2022-02-24 ENCOUNTER — Telehealth: Payer: Self-pay | Admitting: Radiation Oncology

## 2022-02-24 ENCOUNTER — Other Ambulatory Visit (HOSPITAL_COMMUNITY): Payer: Self-pay | Admitting: Urology

## 2022-02-24 DIAGNOSIS — C61 Malignant neoplasm of prostate: Secondary | ICD-10-CM

## 2022-02-24 NOTE — Telephone Encounter (Signed)
Called patient to schedule a consultation w. Dr. Manning. No answer, LVM for a return call.  ?

## 2022-02-25 ENCOUNTER — Telehealth: Payer: Self-pay | Admitting: Radiation Oncology

## 2022-02-25 NOTE — Telephone Encounter (Signed)
Called patient to schedule a consultation w. Dr. Manning. No answer, LVM for a return call.  ?

## 2022-03-08 ENCOUNTER — Other Ambulatory Visit: Payer: Self-pay | Admitting: Cardiovascular Disease

## 2022-03-16 NOTE — Progress Notes (Signed)
GU Location of Tumor / Histology: Prostate Ca ? ?If Prostate Cancer, Gleason Score is (4 + 4) and PSA is (13.20 as of 10/2021). ? ?Biopsies  ?Dr. Lovena Neighbours ? ? ? ? ? ?Past/Anticipated interventions by urology, if any:  ? ? ? ? ?Past/Anticipated interventions by medical oncology, if any:  ?NA ? ?Weight changes, if any:  No ? ?IPSS:  16 ?SHIM:  6 ? ?Bowel/Bladder complaints, if any:  No bowel, urgency on bladder. ? ?Nausea/Vomiting, if any:  No ? ?Pain issues, if any:  0/10 ? ?SAFETY ISSUES: ?Prior radiation?  No ?Pacemaker/ICD?  No ?Possible current pregnancy?  Male ?Is the patient on methotrexate? No ? ?Current Complaints / other details:  Implantable Loop Recorder (implanted 06/2015 and removal 12/2018) Dr. Thompson Grayer. ?

## 2022-03-18 ENCOUNTER — Ambulatory Visit (HOSPITAL_COMMUNITY)
Admission: RE | Admit: 2022-03-18 | Discharge: 2022-03-18 | Disposition: A | Discharge status: Home / Self Care | Payer: Medicare Other | Source: Ambulatory Visit | Attending: Urology | Admitting: Urology

## 2022-03-18 DIAGNOSIS — N400 Enlarged prostate without lower urinary tract symptoms: Secondary | ICD-10-CM | POA: Diagnosis not present

## 2022-03-18 DIAGNOSIS — I2510 Atherosclerotic heart disease of native coronary artery without angina pectoris: Secondary | ICD-10-CM | POA: Diagnosis not present

## 2022-03-18 DIAGNOSIS — C61 Malignant neoplasm of prostate: Secondary | ICD-10-CM | POA: Insufficient documentation

## 2022-03-18 DIAGNOSIS — I700 Atherosclerosis of aorta: Secondary | ICD-10-CM | POA: Diagnosis not present

## 2022-03-18 MED ORDER — PIFLIFOLASTAT F 18 (PYLARIFY) INJECTION
9.0000 | Freq: Once | INTRAVENOUS | Status: AC
  Administered 2022-03-18: 8.92 via INTRAVENOUS

## 2022-03-21 DIAGNOSIS — C61 Malignant neoplasm of prostate: Secondary | ICD-10-CM

## 2022-03-21 HISTORY — DX: Malignant neoplasm of prostate: C61

## 2022-03-21 NOTE — Progress Notes (Signed)
?Radiation Oncology         [phone] ?________________________________ ? ?Initial Outpatient Consultation ? ?Name: James Tate MRN: [id number]  ?Date: 03/22/2022  DOB: 28-Jan-1946 ? ?OI:[id number], James Baltimore, MD  James Tate*  ? ?REFERRING PHYSICIAN: Winter, Christopher Aar* ? ?DIAGNOSIS: 76 y.o. gentleman with Stage T1c adenocarcinoma of the prostate with Gleason score of 4+4, and PSA of 13.2. ? ?  ICD-10-CM   ?1. Malignant neoplasm of prostate ([place])  C61   ?  ? ? ?HISTORY OF PRESENT ILLNESS: James Tate is a 76 y.o. male with a diagnosis of prostate cancer. He had PSA of 4.43 in 2011 and TRUS with Biopsy on 07/31/10, showed only HGPIN on the right side.  PSA remained elevated in 2013 at 5.68, and repeat biopsy on 10/11/12 also showed only HGPIN now on the left side.  He subsequently had surveillance prostate MRI's in 2014 and 2021, unremarkable. ? ?He was noted to have an increasingly elevated PSA up to 13.2 in November 2022.  Accordingly, he had MRI prostate on 11/18/21 ordered by Dr. Lovena Neighbours.  THis showed an ill-defined T2 hypointense nodule is seen in the left and right anterior apex, measuring 2.2 x 1.4 cm on image 24/8. This nodule shows marked ADC hypointensity, mild DWI hyperintensity, and early focal contrast enhancement. PI-RADS 4.   The patient proceeded to MRI fusion transrectal ultrasound with 4 ROI biopsies and standard 12 biopsies of the prostate on 02/11/22.  The prostate volume measured 140 cc.  The four ROI biopsies were negative.  Out of 12 core biopsies, 1 was positive.  The maximum Gleason score was 4+4, and this was seen in left apex. ? ? ? ?He underwent PSMA PET for staging on 03/18/22 which showed several foci of radiotracer activity within the prostate gland ?are suspicious for adenocarcinoma.  There was no evidence metastatic adenopathy in the pelvis or periaortic retroperitoneum. There was also no evidence of visceral metastasis or skeletal metastasis. ? ? ? ?The patient  reviewed the biopsy results with his urologist and he has kindly been referred today for discussion of potential radiation treatment options. ? ? ?PREVIOUS RADIATION THERAPY: No ? ?PAST MEDICAL HISTORY:  ?Past Medical History:  ?Diagnosis Date  ? Arthritis   ? BPH (benign prostatic hyperplasia)   ? Cor triatriatum   ? 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 Flat feet   ? History of colon polyps   ? History of gallstones   ? History of hyperparathyroidism   ? Melanoma of back ([place])   ? MR (mitral regurgitation) 05/16/2018  ? Moderate noted on ECHO  ? MVP (mitral valve prolapse) 05/16/2018  ? Noted on ECHO  ? OSA on CPAP   ? Osteopenia   ? resolved with medical therapy (calcium and fosamax)  ? PFO (patent foramen ovale)   ? Sensorineural hearing loss (SNHL) of both ears   ? Wears hearing aid   ? both  ?   ? ?PAST SURGICAL HISTORY: ?Past Surgical History:  ?Procedure Laterality Date  ? APPLICATION OF A-CELL OF BACK N/A 12/19/2014  ? Procedure: APPLICATION OF A-CELL OF BACK;  Surgeon: Theodoro Kos, DO;  Location: [facility];  Service: Plastics;  Laterality: N/A;  ? CHOLECYSTECTOMY  1996  ? COLONOSCOPY    ? EP IMPLANTABLE DEVICE N/A 07/02/2015  ? Procedure: Loop Recorder Insertion;  Surgeon: Thompson Grayer, MD;  Location: [facility];  Service: Cardiovascular;  Laterality: N/A;  ? implantable loop recorder removal  12/13/2018  ?  MDT LINQ removed in office by Dr Rayann Heman  ? LASIK  2003  ? MELANOMA EXCISION N/A 10/04/2014  ? Procedure: WIDE LOCAL EXCISION OF UPPER BACK MELANOMA ;  Surgeon: Gayland Curry, MD;  Location: [facility];  Service: General;  Laterality: N/A;  ? PARATHYROIDECTOMY  2004  ? TEE WITHOUT CARDIOVERSION N/A 06/12/2015  ? Procedure: TRANSESOPHAGEAL ECHOCARDIOGRAM (TEE);  Surgeon: Josue Hector, MD;  [facility];  Service: Cardiovascular;  Laterality: N/A;  ? TONSILLECTOMY    ? TOTAL KNEE ARTHROPLASTY Left 12/22/2018  ? Procedure: TOTAL KNEE ARTHROPLASTY;  Surgeon: Dorna Leitz, MD;   Location: [facility];  Service: Orthopedics;  Laterality: Left;  ? ? ?FAMILY HISTORY:  ?Family History  ?Problem Relation Age of Onset  ? Heart Problems Mother   ?     valve replacement & pacemaker  ? Osteoporosis Father   ? Other Maternal Grandfather   ?     poss MI, per pt  ? ? ?SOCIAL HISTORY:  ?Social History  ? ?Socioeconomic History  ? Marital status: Married  ?  Spouse name: Not on file  ? Number of children: Not on file  ? Years of education: Not on file  ? Highest education level: Not on file  ?Occupational History  ? Not on file  ?Tobacco Use  ? Smoking status: Former  ?  Types: Cigarettes  ?  Quit date: 09/27/1971  ?  Years since quitting: 50.5  ? Smokeless tobacco: Never  ?Vaping Use  ? Vaping Use: Never used  ?Substance and Sexual Activity  ? Alcohol use: Yes  ?  Alcohol/week: 2.0 standard drinks  ?  Types: 2 Glasses of wine per week  ?  Comment: about 2-3 glasses a week  ? Drug use: No  ? Sexual activity: Not on file  ?Other Topics Concern  ? Not on file  ?Social History Narrative  ? Pt lives in [place] with spouse.  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  Right handed   ? ?Social Determinants of Health  ? ?Financial Resource Strain: Not on file  ?Food Insecurity: Not on file  ?Transportation Needs: Not on file  ?Physical Activity: Not on file  ?Stress: Not on file  ?Social Connections: Not on file  ?Intimate Partner Violence: Not on file  ? ? ?ALLERGIES: Atorvastatin, Codeine, Erythromycin, Other, and Rosuvastatin calcium ? ?MEDICATIONS:  ?Current Outpatient Medications  ?Medication Sig Dispense Refill  ? econazole nitrate 1 % cream 1 application to affected area    ? acetaminophen (TYLENOL) 325 MG tablet 1 tablet as needed    ? aspirin EC 81 MG tablet Take 81 mg by mouth daily.    ? Cholecalciferol (D2000 ULTRA STRENGTH) 50 MCG (2000 UT) CAPS Take 2,000 Units by mouth daily.    ? Cyanocobalamin (VITAMIN B-12 PO) Take 1 tablet by mouth every morning.    ? Miconazole Nitrate 2 % OINT Apply 1  application topically daily as needed.    ? Multiple Minerals-Vitamins (CAL-MAG-ZINC-D PO) Take 1 tablet by mouth daily.    ? Multiple Vitamin (MULTIVITAMIN) capsule Take 1 capsule by mouth daily.    ? Omega-3 Fatty Acids (OMEGA 3 PO) Take 1 tablet by mouth every morning.    ? PRALUENT 75 MG/ML SOAJ INJECT 1 PEN UNDER THE SKIN EVERY 14 DAYS 2 mL 11  ? SHINGRIX injection     ? tadalafil (CIALIS) 20 MG tablet Take 20 mg by mouth daily as needed for erectile dysfunction.    ?  triamcinolone (KENALOG) 0.1 % paste Use as directed 1 application in the mouth or throat 2 (two) times daily as needed (mouth sores).    ? Vitamin K, Phytonadione, 100 MCG TABS Take 100 mcg by mouth daily.     ? ?No current facility-administered medications for this encounter.  ? ? ?REVIEW OF SYSTEMS:  On review of systems, the patient reports that he is doing well overall. He denies any chest pain, shortness of breath, cough, fevers, chills, night sweats, unintended weight changes. He denies any bowel disturbances, and denies abdominal pain, nausea or vomiting. He denies any new musculoskeletal or joint aches or pains. His IPSS was Total Score: 16, indicating moderate urinary symptoms (Reference 0-7 mild, 8-19 moderate, 20-35 severe).  His SHIM: 6, indicating he has severe erectile dysfunction (Reference - 22-25 None, 17-21 Mild, 8-16 Moderate, 1-7 Severe). A complete review of systems is obtained and is otherwise negative. ?  ?PHYSICAL EXAM:  ?Wt Readings from Last 3 Encounters:  ?03/22/22 192 lb (87.1 kg)  ?03/18/22 193 lb (87.5 kg)  ?02/09/22 197 lb (89.4 kg)  ? ?Temp Readings from Last 3 Encounters:  ?03/22/22 (!) 96.1 ?F (35.6 ?C) (Temporal)  ?12/23/18 98.1 ?F (36.7 ?C) (Oral)  ?12/18/18 98.8 ?F (37.1 ?C) (Oral)  ? ?BP Readings from Last 3 Encounters:  ?03/22/22 131/79  ?02/09/22 127/73  ?08/05/21 107/64  ? ?Pulse Readings from Last 3 Encounters:  ?03/22/22 79  ?02/09/22 82  ?08/05/21 78  ? ?Pain Assessment ?Pain Score: 0-No pain/10 ? ?In  general this is a well appearing gentleman in no acute distress. He's alert and oriented x4 and appropriate throughout the examination. Cardiopulmonary assessment is negative for acute distress, and he exhibits normal effor

## 2022-03-22 ENCOUNTER — Other Ambulatory Visit: Payer: Self-pay

## 2022-03-22 ENCOUNTER — Ambulatory Visit
Admission: RE | Admit: 2022-03-22 | Discharge: 2022-03-22 | Disposition: A | Discharge status: Home / Self Care | Payer: Medicare Other | Source: Ambulatory Visit | Attending: Radiation Oncology | Admitting: Radiation Oncology

## 2022-03-22 VITALS — BP 131/79 | HR 79 | Temp 96.1°F | Resp 18 | Ht 72.0 in | Wt 192.0 lb

## 2022-03-22 DIAGNOSIS — Z860100 Personal history of colon polyps, unspecified: Secondary | ICD-10-CM | POA: Insufficient documentation

## 2022-03-22 DIAGNOSIS — E7800 Pure hypercholesterolemia, unspecified: Secondary | ICD-10-CM

## 2022-03-22 DIAGNOSIS — R413 Other amnesia: Secondary | ICD-10-CM | POA: Insufficient documentation

## 2022-03-22 DIAGNOSIS — Z87891 Personal history of nicotine dependence: Secondary | ICD-10-CM | POA: Insufficient documentation

## 2022-03-22 DIAGNOSIS — Z85820 Personal history of malignant melanoma of skin: Secondary | ICD-10-CM

## 2022-03-22 DIAGNOSIS — R17 Unspecified jaundice: Secondary | ICD-10-CM

## 2022-03-22 DIAGNOSIS — N529 Male erectile dysfunction, unspecified: Secondary | ICD-10-CM | POA: Insufficient documentation

## 2022-03-22 DIAGNOSIS — G4733 Obstructive sleep apnea (adult) (pediatric): Secondary | ICD-10-CM | POA: Insufficient documentation

## 2022-03-22 DIAGNOSIS — M129 Arthropathy, unspecified: Secondary | ICD-10-CM | POA: Insufficient documentation

## 2022-03-22 DIAGNOSIS — Q211 Atrial septal defect, unspecified: Secondary | ICD-10-CM

## 2022-03-22 DIAGNOSIS — Z79899 Other long term (current) drug therapy: Secondary | ICD-10-CM | POA: Diagnosis not present

## 2022-03-22 DIAGNOSIS — N400 Enlarged prostate without lower urinary tract symptoms: Secondary | ICD-10-CM | POA: Diagnosis not present

## 2022-03-22 DIAGNOSIS — M8580 Other specified disorders of bone density and structure, unspecified site: Secondary | ICD-10-CM | POA: Insufficient documentation

## 2022-03-22 DIAGNOSIS — C61 Malignant neoplasm of prostate: Secondary | ICD-10-CM | POA: Diagnosis not present

## 2022-03-22 DIAGNOSIS — I2510 Atherosclerotic heart disease of native coronary artery without angina pectoris: Secondary | ICD-10-CM | POA: Insufficient documentation

## 2022-03-22 DIAGNOSIS — Z7982 Long term (current) use of aspirin: Secondary | ICD-10-CM | POA: Diagnosis not present

## 2022-03-22 DIAGNOSIS — G459 Transient cerebral ischemic attack, unspecified: Secondary | ICD-10-CM

## 2022-03-22 DIAGNOSIS — Z191 Hormone sensitive malignancy status: Secondary | ICD-10-CM | POA: Diagnosis not present

## 2022-03-22 DIAGNOSIS — H9190 Unspecified hearing loss, unspecified ear: Secondary | ICD-10-CM | POA: Insufficient documentation

## 2022-03-22 DIAGNOSIS — Z8673 Personal history of transient ischemic attack (TIA), 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 residual deficits: Secondary | ICD-10-CM | POA: Insufficient documentation

## 2022-03-22 HISTORY — DX: Obstructive sleep apnea (adult) (pediatric): G47.33

## 2022-03-22 HISTORY — DX: Male erectile dysfunction, unspecified: N52.9

## 2022-03-22 HISTORY — DX: Unspecified jaundice: R17

## 2022-03-22 HISTORY — DX: Personal history of colon polyps, unspecified: Z86.0100

## 2022-03-22 HISTORY — DX: Personal history of malignant melanoma of skin: Z85.820

## 2022-03-22 HISTORY — DX: Atrial septal defect, unspecified: Q21.10

## 2022-03-22 HISTORY — DX: Pure hypercholesterolemia, unspecified: E78.00

## 2022-03-22 HISTORY — DX: Transient cerebral ischemic attack, unspecified: G45.9

## 2022-03-22 NOTE — Progress Notes (Signed)
Introduced myself to the patient, and his wife, as the prostate nurse navigator.  He is here to discuss his radiation treatment options.  Patient has decided to proceed with ADT followed by 8 weeks of IMRT approximately 2 months after starting ADT.  I explained what to expect with upcoming IMRT and explained my role. I gave him my business card and asked him to call me with questions or concerns.  Verbalized understanding.  ?

## 2022-03-23 ENCOUNTER — Telehealth: Payer: Self-pay | Admitting: *Deleted

## 2022-03-23 NOTE — Telephone Encounter (Signed)
CALLED PATIENT TO INFORM OF ADT APPT. FOR 04-02-22- ARRIVAL TIME- 9:30 AM @ [facility], LVM FOR A RETURN CALL ?

## 2022-04-02 DIAGNOSIS — C61 Malignant neoplasm of prostate: Secondary | ICD-10-CM | POA: Diagnosis not present

## 2022-04-06 ENCOUNTER — Other Ambulatory Visit: Payer: Self-pay | Admitting: Family Medicine

## 2022-04-06 DIAGNOSIS — M8580 Other specified disorders of bone density and structure, unspecified site: Secondary | ICD-10-CM

## 2022-04-06 DIAGNOSIS — I700 Atherosclerosis of aorta: Secondary | ICD-10-CM | POA: Diagnosis not present

## 2022-04-06 DIAGNOSIS — G4733 Obstructive sleep apnea (adult) (pediatric): Secondary | ICD-10-CM | POA: Diagnosis not present

## 2022-04-06 DIAGNOSIS — E7800 Pure hypercholesterolemia, unspecified: Secondary | ICD-10-CM | POA: Diagnosis not present

## 2022-04-06 DIAGNOSIS — Z85820 Personal history of malignant melanoma of skin: Secondary | ICD-10-CM | POA: Diagnosis not present

## 2022-04-06 DIAGNOSIS — R413 Other amnesia: Secondary | ICD-10-CM | POA: Diagnosis not present

## 2022-04-06 DIAGNOSIS — Z0000 Encounter for general adult medical examination without abnormal findings: Secondary | ICD-10-CM | POA: Diagnosis not present

## 2022-04-06 DIAGNOSIS — H409 Unspecified glaucoma: Secondary | ICD-10-CM | POA: Diagnosis not present

## 2022-04-06 DIAGNOSIS — Z8673 Personal history of transient ischemic attack (TIA), 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 residual deficits: Secondary | ICD-10-CM | POA: Diagnosis not present

## 2022-04-06 DIAGNOSIS — Z1389 Encounter for screening for other disorder: Secondary | ICD-10-CM | POA: Diagnosis not present

## 2022-04-06 DIAGNOSIS — C61 Malignant neoplasm of prostate: Secondary | ICD-10-CM | POA: Diagnosis not present

## 2022-04-06 DIAGNOSIS — Z79899 Other long term (current) drug therapy: Secondary | ICD-10-CM | POA: Diagnosis not present

## 2022-04-14 ENCOUNTER — Other Ambulatory Visit: Payer: Self-pay | Admitting: Urology

## 2022-04-20 DIAGNOSIS — H903 Sensorineural hearing loss, bilateral: Secondary | ICD-10-CM | POA: Diagnosis not present

## 2022-04-20 DIAGNOSIS — K118 Other diseases of salivary glands: Secondary | ICD-10-CM | POA: Diagnosis not present

## 2022-04-20 HISTORY — DX: Other diseases of salivary glands: K11.8

## 2022-04-21 DIAGNOSIS — R972 Elevated prostate specific antigen [PSA]: Secondary | ICD-10-CM | POA: Diagnosis not present

## 2022-04-26 DIAGNOSIS — C61 Malignant neoplasm of prostate: Secondary | ICD-10-CM | POA: Diagnosis not present

## 2022-05-07 DIAGNOSIS — H401121 Primary open-angle glaucoma, left eye, mild stage: Secondary | ICD-10-CM | POA: Diagnosis not present

## 2022-05-07 DIAGNOSIS — H5340 Unspecified visual field defects: Secondary | ICD-10-CM | POA: Diagnosis not present

## 2022-05-07 DIAGNOSIS — H52223 Regular astigmatism, bilateral: Secondary | ICD-10-CM | POA: Diagnosis not present

## 2022-05-07 DIAGNOSIS — H4010X1 Unspecified open-angle glaucoma, mild stage: Secondary | ICD-10-CM | POA: Diagnosis not present

## 2022-05-07 DIAGNOSIS — H5213 Myopia, bilateral: Secondary | ICD-10-CM | POA: Diagnosis not present

## 2022-06-15 ENCOUNTER — Other Ambulatory Visit: Payer: Self-pay | Admitting: Urology

## 2022-07-15 ENCOUNTER — Ambulatory Visit: Payer: Medicare Other | Admitting: Radiation Oncology

## 2022-07-19 ENCOUNTER — Telehealth: Payer: Self-pay | Admitting: Cardiovascular Disease

## 2022-07-19 NOTE — Telephone Encounter (Signed)
Left message for the pt to call back for in office for pre op clearance

## 2022-07-19 NOTE — Telephone Encounter (Signed)
   Name: James Tate  DOB: [birth year]  MRN: [id number]  Primary Cardiologist: Jenkins Rouge, MD  Chart reviewed as part of pre-operative protocol coverage. Patient has not been seen in our office in over 1 year (06/2021) Therefore, Because of [name] past medical history and time since last visit, he will require a follow-up in-office visit in order to better assess preoperative cardiovascular risk.  Pre-op covering staff: - Please schedule appointment and call patient to inform them. If patient already had an upcoming appointment within acceptable timeframe, please add "pre-op clearance" to the appointment notes so provider is aware. - Please contact requesting surgeon's office via preferred method (i.e, phone, fax) to inform them of need for appointment prior to surgery.  Patient is on Aspirin for prior stroke and PFO. He has previously been given the OK to hold Aspirin for 5 days prior MRI fusion biopsy in 02/2022. This same recommendation should be able to be used.  Darreld Mclean, PA-C  07/19/2022, 4:24 PM

## 2022-07-19 NOTE — Telephone Encounter (Signed)
   Pre-operative Risk Assessment    Patient Name: James Tate  DOB: 1946-11-14 MRN: [id number]      Request for Surgical Clearance    Procedure:   Fiducial markers and space OAR  Date of Surgery:  Clearance 07/29/22                                 Surgeon:  Dr. Ellison Hughs  Surgeon's Group or Practice Name:  [facility]  Phone number:  [phone] Fax number:  [phone]   Type of Clearance Requested:   - Medical  - Pharmacy:  Hold Aspirin 5 days prior    Type of Anesthesia:  General    Additional requests/questions:    Dorthey Sawyer   07/19/2022, 1:54 PM

## 2022-07-19 NOTE — Progress Notes (Signed)
[facility] cardiology 06-09-2021  dr Johnsie Cancel  epic and lov neurology 02-09-2022 Sharene Butters, pa epic reviewed by dr Carlynn Herald, pt ok for 07-29-2022 surgery at [facility] barring any acute status changes per dr e oddono mda.

## 2022-07-20 NOTE — Progress Notes (Unsigned)
Office Visit    Patient Name: [name] Date of Encounter: 07/21/2022  Primary Care Provider:  Gaynelle Arabian, MD Primary Cardiologist:  Jenkins Rouge, MD Primary Electrophysiologist: None  Chief Complaint    James Tate is a 76 y.o. male with PMH of CVA, Cor triatriatum, PFO, OSA on CPAP who presents for preoperative clearance.  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   Flat feet    History of colon polyps    History of gallstones    History of hyperparathyroidism    Melanoma of back ([place])    MR (mitral regurgitation) 05/16/2018   Moderate noted on ECHO   MVP (mitral valve prolapse) 05/16/2018   Noted on ECHO   OSA on CPAP    Osteopenia    resolved with medical therapy (calcium and fosamax)   PFO (patent foramen ovale)    Sensorineural hearing loss (SNHL) of both ears    Wears hearing aid    both   Past Surgical History:  Procedure Laterality Date   APPLICATION OF A-CELL OF BACK N/A 12/19/2014   Procedure: APPLICATION OF A-CELL OF BACK;  Surgeon: Theodoro Kos, DO;  Location: [facility];  Service: Plastics;  Laterality: N/A;   CHOLECYSTECTOMY  1996   COLONOSCOPY     EP IMPLANTABLE DEVICE N/A 07/02/2015   Procedure: Loop Recorder Insertion;  Surgeon: Thompson Grayer, MD;  Location: [facility];  Service: Cardiovascular;  Laterality: N/A;   implantable loop recorder removal  12/13/2018   MDT LINQ removed in office by Dr Rayann Heman   LASIK  2003   MELANOMA EXCISION N/A 10/04/2014   Procedure: WIDE LOCAL EXCISION OF UPPER BACK MELANOMA ;  Surgeon: Gayland Curry, MD;  Location: [facility];  Service: General;  Laterality: N/A;   PARATHYROIDECTOMY  2004   TEE WITHOUT CARDIOVERSION N/A 06/12/2015   Procedure: TRANSESOPHAGEAL ECHOCARDIOGRAM (TEE);  Surgeon: Josue Hector, MD;  Location: [facility];  Service: Cardiovascular;   Laterality: N/A;   TONSILLECTOMY     TOTAL KNEE ARTHROPLASTY Left 12/22/2018   Procedure: TOTAL KNEE ARTHROPLASTY;  Surgeon: Dorna Leitz, MD;  Location: [facility];  Service: Orthopedics;  Laterality: Left;    Allergies  Allergies  Allergen Reactions   Atorvastatin Other (See Comments)   Codeine Other (See Comments)    Headache.    Erythromycin     Childhood allergy - unknown   Other     Other reaction(s): Headache   Rosuvastatin Calcium     Other reaction(s): ? Memory problem (03/2020)    History of Present Illness    James Tate is a 76 year old male with the above mentioned PMH who presents today for preoperative clearance for prostate surgery. 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 He was referred to Dr. Burt Knack for PFO closure and was recommended to continue medical therapy with statin and ASA.  EF was noted to be 55- 60% with mild  LVH and dilated LA, positive right to left shunt bubble study with atrial septal aneurysm and mild MR regurgitation. There was no revealing cause of his stroke. He was then referred to EP and had a ILR placed in 06/2015. He has had no PAF  recorded to date. He is currently Praluent  due to myalgias and memory issues with statins  He was last seen by Dr. Johnsie Cancel on 06/2021 for follow up and had no cardiac issues or concerns. No medication adjustments or changes noted. He was seen by neurology in 07/2021 for memory issues. MRI of the brain was completed by neurologist with no evidence of tumor or stroke. He is currently being treated for prostate CA that was diagnosed 03/2022.  Mr. Mabile presents today alone for surgical clearance.  He reports that he has been doing well since last year and denies any angina or chest pain.  He is quite active and plays golf with his friends.  He uses a golf cart due to right knee arthritis.  He is otherwise active with housework and  other chores around his home.  He did report 1 isolated episode on Monday where he became dizzy while shopping with his wife.  He noticed a hot flash and felt like he needed to rest and noted that the spell was short-lived. He denied any vision changes, unilateral weakness or facial droop. He denied any palpitations or chest pressure with this episode.  I encouraged him to contact our office if he has an additional episode similar to the one that occurred on Monday.  Patient denies chest pain, palpitations, dyspnea, PND, orthopnea, nausea, vomiting, syncope, edema, weight gain, or early satiety.  Home Medications    Current Outpatient Medications  Medication Sig Dispense Refill   acetaminophen (TYLENOL) 325 MG tablet 1 tablet as needed     aspirin EC 81 MG tablet Take 81 mg by mouth daily.     Cholecalciferol (D2000 ULTRA STRENGTH) 50 MCG (2000 UT) CAPS Take 2,000 Units by mouth daily.     Cyanocobalamin (VITAMIN B-12 PO) Take 1 tablet by mouth every morning.     econazole nitrate 1 % cream 1 application to affected area     Miconazole Nitrate 2 % OINT Apply 1 application topically daily as needed.     Multiple Minerals-Vitamins (CAL-MAG-ZINC-D PO) Take 1 tablet by mouth daily.     Multiple Vitamin (MULTIVITAMIN) capsule Take 1 capsule by mouth daily.     Omega-3 Fatty Acids (OMEGA 3 PO) Take 1 tablet by mouth every morning.     PRALUENT 75 MG/ML SOAJ INJECT 1 PEN UNDER THE SKIN EVERY 14 DAYS 2 mL 11   SHINGRIX injection      tadalafil (CIALIS) 20 MG tablet Take 20 mg by mouth daily as needed for erectile dysfunction.     triamcinolone (KENALOG) 0.1 % paste Use as directed 1 application in the mouth or throat 2 (two) times daily as needed (mouth sores).     Vitamin K, Phytonadione, 100 MCG TABS Take 100 mcg by mouth daily.      No current facility-administered medications for this visit.     Review of Systems  Please see the history of present illness.    (+) Dizzy spell (+) Arthritic  right knee pain  All other systems reviewed and are otherwise negative except as noted above.  Physical Exam    Wt Readings from Last 3 Encounters:  07/21/22 187 lb (84.8 kg)  03/22/22 192 lb (87.1 kg)  03/18/22 193 lb (87.5 kg)   VS: Vitals:   07/21/22 1022  BP: 126/74  Pulse: 60  SpO2: 96%  ,Body mass index is 25.36 kg/m.  Constitutional:      Appearance: Healthy appearance. Not in distress.  Neck:  Vascular: JVD normal.  Pulmonary:     Effort: Pulmonary effort is normal.     Breath sounds: No wheezing. No rales. Diminished in the bases Cardiovascular:     Normal rate. Regular rhythm. Normal S1. Normal S2.      Murmurs: There is no murmur.  Edema:    Peripheral edema absent.  Abdominal:     Palpations: Abdomen is soft non tender. There is no hepatomegaly.  Skin:    General: Skin is warm and dry.  Neurological:     General: No focal deficit present.     Mental Status: Alert and oriented to person, place and time.     Cranial Nerves: Cranial nerves are intact.  EKG/LABS/Other Studies Reviewed    ECG personally reviewed by me today -sinus rhythm with rate of 60 and no acute changes.  Lab Results  Component Value Date   WBC 16.2 (H) 12/23/2018   HGB 12.9 (L) 12/23/2018   HCT 39.0 12/23/2018   MCV 92.4 12/23/2018   PLT 222 12/23/2018   Lab Results  Component Value Date   CREATININE 0.86 12/18/2018   BUN 21 12/18/2018   NA 141 12/18/2018   K 4.3 12/18/2018   CL 103 12/18/2018   CO2 27 12/18/2018   Lab Results  Component Value Date   ALT 29 06/17/2021   AST 34 06/17/2021   ALKPHOS 91 06/17/2021   BILITOT 1.5 (H) 06/17/2021   Lab Results  Component Value Date   CHOL 151 06/17/2021   HDL 55 06/17/2021   LDLCALC 67 06/17/2021   TRIG 174 (H) 06/17/2021   CHOLHDL 2.7 06/17/2021    No results found for: "HGBA1C"  Assessment & Plan    Surgical Clearance: The patient affirms he has been doing well without any new cardiac symptoms. They are able  to achieve 5 METS without cardiac limitations. Therefore, based on ACC/AHA guidelines, the patient would be at acceptable risk for the planned procedure without further cardiovascular testing. The patient was advised that if he develops new symptoms prior to surgery to contact our office to arrange for a follow-up visit, and he verbalized understanding.  6} Mr. Sesler perioperative risk of a major cardiac event is 0.9% according to the Revised Cardiac Risk Index (RCRI).  Therefore, he is at low risk for perioperative complications.   His functional capacity is excellent at 6.61 METs according to the Duke Activity Status Index (DASI). Recommendations: According to ACC/AHA guidelines, no further cardiovascular testing needed.  The patient may proceed to surgery at acceptable risk.   Antiplatelet and/or Anticoagulation Recommendations: Aspirin can be held for 5 days prior to his surgery.  Please resume Aspirin post operatively when it is felt to be safe from a bleeding standpoint.    2. Cryptogenic stroke: -Patient reports no recent changes or symptoms or accelerated heart rates since previous visit. -He did note an isolated episode of dizziness that required resting for short spell and passed without any additional interventions. -He was encouraged to contact our office if he episodes recur and increase in frequency. -Continue ASA 81 mg following upcoming procedure and cholesterol therapy with Praluent as noted below  3. HLD: -Patients last LDL was 63 at goal of less than 70 -Continue Praluent 75 mg Bi monthly  4.OSA: -Patient reports compliance nightly with CPAP. -Blood pressure and heart rate well controlled today  5.Prostate Cancer: -Currently followed by oncology and is currently on hormonal therapy and radiation treatment.  6.  Mitral valve prolapse: -Patient's last  2D echo was performed 07/2019 with EF of 60-65%, moderate basal septal hypertrophy, no RWMA, -Blood pressure currently  well controlled at 126/74 with heart rate of 60  Disposition: Follow-up with Jenkins Rouge, MD or APP in 12 months    Medication Adjustments/Labs and Tests Ordered: Current medicines are reviewed at length with the patient today.  Concerns regarding medicines are outlined above.   Signed, Mable Fill, Marissa Nestle, NP 07/21/2022, 10:42 AM [place]

## 2022-07-20 NOTE — Telephone Encounter (Signed)
Pt has appt 8/16/213 for pre op clearance

## 2022-07-21 ENCOUNTER — Ambulatory Visit (INDEPENDENT_AMBULATORY_CARE_PROVIDER_SITE_OTHER): Payer: Medicare Other | Admitting: Nurse Practitioner

## 2022-07-21 ENCOUNTER — Encounter: Payer: Self-pay | Admitting: Nurse Practitioner

## 2022-07-21 VITALS — BP 126/74 | HR 60 | Ht 72.0 in | Wt 187.0 lb

## 2022-07-21 DIAGNOSIS — E7800 Pure hypercholesterolemia, unspecified: Secondary | ICD-10-CM | POA: Diagnosis not present

## 2022-07-21 DIAGNOSIS — C61 Malignant neoplasm of prostate: Secondary | ICD-10-CM

## 2022-07-21 DIAGNOSIS — I341 Nonrheumatic mitral (valve) prolapse: Secondary | ICD-10-CM

## 2022-07-21 DIAGNOSIS — Z01810 Encounter for preprocedural cardiovascular examination: Secondary | ICD-10-CM

## 2022-07-21 DIAGNOSIS — I639 Cerebral infarction, unspecified: Secondary | ICD-10-CM

## 2022-07-21 NOTE — Patient Instructions (Addendum)
Medication Instructions:  Your physician recommends that you continue on your current medications as directed. Please refer to the Current Medication list given to you today.  *If you need a refill on your cardiac medications before your next appointment, please call your pharmacy*   Follow-Up: At [facility], you and your health needs are our priority.  As part of our continuing mission to provide you with exceptional heart care, we have created designated Provider Care Teams.  These Care Teams include your primary Cardiologist (physician) and Advanced Practice Providers (APPs -  Physician Assistants and Nurse Practitioners) who all work together to provide you with the care you need, when you need it.   Your next appointment:   1 year(s)  The format for your next appointment:   In Person  Provider:   Jenkins Rouge, MD {

## 2022-07-22 ENCOUNTER — Encounter (HOSPITAL_BASED_OUTPATIENT_CLINIC_OR_DEPARTMENT_OTHER): Payer: Self-pay | Admitting: Urology

## 2022-07-22 ENCOUNTER — Other Ambulatory Visit: Payer: Self-pay

## 2022-07-22 NOTE — Progress Notes (Addendum)
Spoke w/ via phone for pre-op interview---pt Lab needs dos----   I stat            Lab results------ COVID test -----patient states asymptomatic no test needed Arrive at -------530 am 07-29-2022 NPO after MN NO Solid Food.  Clear liquids from MN until---430 am Med rec completed Medications to take morning of surgery -----none Diabetic medication -----n/a Patient instructed no nail polish to be worn day of surgery Patient instructed to bring photo id and insurance card day of surgery Patient aware to have Driver (ride ) / caregiver   wife patricia will stay  for 24 hours after surgery  Patient Special Instructions -----fleets enema am of surgery Pre-Op special Istructions -----pt states he was told by cardiology to stop 81 mg asa 5 days before surgery (last dose to be 07-23-2022 per pt), Coni at dr borden office called pt to verify pt aware to stop 81 mg asa prior to 07-29-2022 surgery Patient verbalized understanding of instructions that were given at this phone interview. Patient denies shortness of breath, chest pain, fever, cough at this phone interview.   Cardiac clearance note James Pancoast jr np 07-21-2022 chart/epic Ekg 07-21-2022 chart/epic Echo 08-02-2019 epic Neurology lov James Butters pa 02-09-2022 epic, next neurology appointment 08-04-2022

## 2022-07-26 DIAGNOSIS — C61 Malignant neoplasm of prostate: Secondary | ICD-10-CM | POA: Diagnosis not present

## 2022-07-26 DIAGNOSIS — Z01818 Encounter for other preprocedural examination: Secondary | ICD-10-CM | POA: Diagnosis not present

## 2022-07-28 NOTE — Anesthesia Preprocedure Evaluation (Addendum)
Anesthesia Evaluation  Patient identified by MRN, date of birth, ID band Patient awake    Reviewed: Allergy & Precautions, NPO status , Patient's Chart, lab work & pertinent test results  History of Anesthesia Complications Negative for: history of anesthetic complications  Airway Mallampati: II  TM Distance: >3 FB Neck ROM: Full    Dental  (+) Teeth Intact   Pulmonary sleep apnea and Continuous Positive Airway Pressure Ventilation , former smoker,    Pulmonary exam normal        Cardiovascular Normal cardiovascular exam  Cor triatriatum  Echo 2020: EF 60-65%, moderate basal septal hypertrophy, normal DF, normal RVSF, mild MR, mild AV sclerosis, redundancy of the interatrial septum   Neuro/Psych CVA    GI/Hepatic negative GI ROS, Neg liver ROS,   Endo/Other    Renal/GU negative Renal ROS   Prostate cancer    Musculoskeletal negative musculoskeletal ROS (+)   Abdominal   Peds  Hematology negative hematology ROS (+)   Anesthesia Other Findings   Reproductive/Obstetrics                           Anesthesia Physical Anesthesia Plan  ASA: 3  Anesthesia Plan: MAC   Post-op Pain Management: Tylenol PO (pre-op)* and Minimal or no pain anticipated   Induction: Intravenous  PONV Risk Score and Plan: 1 and Propofol infusion, TIVA and Treatment may vary due to age or medical condition  Airway Management Planned: Natural Airway, Nasal Cannula and Simple Face Mask  Additional Equipment: None  Intra-op Plan:   Post-operative Plan:   Informed Consent: I have reviewed the patients History and Physical, chart, labs and discussed the procedure including the risks, benefits and alternatives for the proposed anesthesia with the patient or authorized representative who has indicated his/her understanding and acceptance.       Plan Discussed with:   Anesthesia Plan Comments:         Anesthesia Quick Evaluation

## 2022-07-28 NOTE — H&P (Signed)
Office Visit Report     07/26/2022   --------------------------------------------------------------------------------   [name]  MRN: [id number]  DOB: 1946-09-04, 76 year old Male  SSN: [id number]   PRIMARY CARE:  Modena Jansky. Marisue Humble, MD  REFERRING:  Glena Norfolk. Lovena Neighbours, MD  PROVIDER:  Raynelle Bring, M.D.  TREATING:  Jiles Crocker, NP  LOCATION:  [facility] [phone]     --------------------------------------------------------------------------------   CC/HPI: CC: Elevated PSA   HPI: Mr. James Tate is a 76 year old male with a history of an elevated PSA value, BPH and ED (previously treated with Cialis).   Last PSA- 13.2 (10/2021), 9.56 (08/2020), 7.89 (07/2019), 7.27 (01/2018). He had two negative prostate biopsies in 2011, 2013 as well as a negative prostate MRIs in 2014 and 2021.  Prostate volume: 140 cm  Biopsy Date: 02/11/2022  TNM stage: T1c  Gleason score: 4+4 = 8  Left: 1 out of 6 cores with grade 4 prostate cancer  Right: NED   10/19/21: The patient is here today for a routine follow-up. He is doing well and reports no new health issues. From a urinary standpoint, he reports a good FOS and feels like he is emptying his bladder well. He has occasional urgency/frequency, but is not bothered by it. Nocturia x 1. Denies interval UTIs, dysuria or hematuria.   02/11/22: The patient is here today for a fusion prostate biopsy after he was found to have a PIRADS 4 lesion involving the anterior transition zone.   02/22/2022: The patient is here today with his wife to discuss his prostate biopsy results, which revealed 1 out of 16 cores positive for grade 4 prostate cancer. He is doing well after his biopsy and denies residual hematuria or blood per rectum.   04/02/22: The patient is here today for a routine follow-up and to discuss LT-ADT.   04/26/22: The patient is here today to have his 89-monthEligard injection. He denies any new health issues since his last  visit and has no complaints.   07/26/2022: Pt here for pre-op appointment prior to undergoing fiducial marker placement and SpaceOAR on 8/24 in anticipation of starting XRT in the near future for continued treatment of his underlying prostate cancer diagnosis. He continues on long-term ADT with Eligard, last injection was in May of this year. Denies changes in past medical history, prescription medications taken on daily basis, no interval surgical or procedural intervention. Patient's voiding symptoms remain grossly stable. He has nocturia x1 but no bothersome daytime frequency/urgency. Force of stream remains adequate. He denies any interval dysuria or gross hematuria. Denies any interval chest pain, shortness of breath. No interval fevers or chills, nausea/vomiting. He's tolerating ADT fairly well. He does have some occasional hot flashes but no excessive fatigue or limitations on normal daily activities including exercise. He continues to play golf.     ALLERGIES: Codeine Derivatives Erythromycin statins    MEDICATIONS: Levofloxacin 750 mg tablet 1 tablet PO As Directed Take one hour prior to your scheduled procedure.  Aspirin Ec 81 mg tablet, delayed release Oral  Calcium  Cialis 20 MG Oral Tablet Oral  Eye Drops  Fish Oil  Latanoprost 0.005 % drops  Multivitamin  Praluent Pen 75 mg/ml pen injector  Tylenol Pm Extra Strength  Vitamin B12  Vitamin D3  Vitamin K2     GU PSH: Prostate Needle Biopsy - 02/11/2022       PSH Notes: Excision Of Lesion Trunk Malignant, Corneal LASIK Bilateral, Cholecystectomy  NON-GU PSH: Cholecystectomy (open) - 2011 Knee replacement, Left Surgical Pathology, Gross And Microscopic Examination For Prostate Needle - 02/11/2022     GU PMH: Prostate Cancer - 04/26/2022, - 04/02/2022, - 02/22/2022 Elevated PSA - 02/11/2022, - 10/19/2021, - 09/11/2020, Elevated prostate specific antigen (PSA), - 2016 BPH w/o LUTS - 10/19/2021, - 09/11/2020, Benign prostatic  hypertrophy without lower urinary tract symptoms, - 2015 BPH w/LUTS - 2019 Nocturia - 2018 ED due to arterial insufficiency, Erectile dysfunction due to arterial insufficiency - 2015      PMH Notes: Past urologic history:   James Tate underwent transrectal ultrasound of the prostate with biopsy in August 2011 for a PSA of approximately 4.4. Prostate was 52 grams. No significant voiding complaints with an AUA symptom score in the past of 3. The patient does have a family history of prostate cancer. Biopsy at that time showed no cancer but 2/12 biopsies did show some focal areas of high grade PIN. Clinically there has been no change. PSA in 01/2011 was up to 4.7. PSA in 8/ 2012 was 3.99. No clinical change.    02/2012 PSA = 4.22    PSA 08/2012 was up to 5.7 with a PSA 2 of 24%.   Nomogram data would suggest a risk of prostate cancer in the 20-25% range with these numbers.    Repeat biopsy was performed in November 2013.Ultrasound this time revealed a prostate of approximately 66 g. No adenocarcinoma was found. The patient again had several biopsies which showed some focal high-grade PIN.     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, Melanoma of back - 2016 Personal history of transient ischemic attack (TIA), 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 residual deficits, History of stroke - 2016 Personal history of other diseases of the circulatory system, History of hypertension - 2014 Personal history of other diseases of the musculoskeletal system and connective tissue, History of osteopenia - 2014 Cerebellar stroke syndrome Glaucoma    FAMILY HISTORY: Family Health Status Number - Runs In Family Father Deceased At Age3 ___ - Runs In Family Mother Deceased At Age 71 from diabetic complicati - Mother osteopenia - Brother, Sister Osteoporosis - Father Prostate  Cancer - Father   SOCIAL HISTORY: Marital Status: Married Preferred Language: English; Race: White Current Smoking Status: Patient does not smoke anymore. Has not smoked since 08/03/1971.  Types of alcohol consumed: Wine, Beer. Light Drinker.  Drinks 3 caffeinated drinks per day. Patient's occupation is/was Retired.    REVIEW OF SYSTEMS:    GU Review Male:   Patient reports get up at night to urinate. Patient denies frequent urination, hard to postpone urination, burning/ pain with urination, leakage of urine, stream starts and stops, trouble starting your stream, have to strain to urinate , erection problems, and penile pain.  Gastrointestinal (Upper):   Patient denies nausea, vomiting, and indigestion/ heartburn.  Gastrointestinal (Lower):   Patient denies diarrhea and constipation.  Constitutional:   Patient reports night sweats. Patient denies fever, weight loss, and fatigue.  Skin:   Patient denies skin rash/ lesion and itching.  Eyes:   Patient denies blurred vision and double vision.  Ears/ Nose/ Throat:   Patient denies sore throat and sinus problems.  Hematologic/Lymphatic:   Patient denies swollen glands and easy bruising.  Cardiovascular:   Patient denies leg swelling and chest pains.  Respiratory:   Patient denies cough and shortness of breath.  Endocrine:  Patient denies excessive thirst.  Musculoskeletal:   Patient denies back pain and joint pain.  Neurological:   Patient reports dizziness. Patient denies headaches.  Psychologic:   Patient denies depression and anxiety.   VITAL SIGNS:      07/26/2022 09:49 AM  BP 116/71 mmHg  Pulse 87 /min  Temperature 97.1 F / 36.1 C   MULTI-SYSTEM PHYSICAL EXAMINATION:    Constitutional: Well-nourished. No physical deformities. Normally developed. Good grooming.  Neck: Neck symmetrical, not swollen. Normal tracheal position.  Respiratory: No labored breathing, no use of accessory muscles.   Cardiovascular: Normal temperature,  normal extremity pulses, no swelling, no varicosities.  Skin: No paleness, no jaundice, no cyanosis. No lesion, no ulcer, no rash.  Neurologic / Psychiatric: Oriented to time, oriented to place, oriented to person. No depression, no anxiety, no agitation.  Gastrointestinal: No mass, no tenderness, no rigidity, non obese abdomen.  Musculoskeletal: Normal gait and station of head and neck.     Complexity of Data:  Source Of History:  Patient, Medical Record Summary  Lab Test Review:   PSA  Records Review:   Pathology Reports, Previous Doctor Records, Previous Hospital Records, Previous Patient Records  Urine Test Review:   Urinalysis, Urine Culture  X-Ray Review: PET Scan: Reviewed Report.  MRI Prostate GSORAD: Reviewed Films. Reviewed Report.     04/21/22 10/15/21 09/03/20 07/20/19 01/16/18 01/10/17 07/26/16 04/23/15  PSA  Total PSA 9.83 ng/mL 13.20 ng/mL 9.56 ng/mL 7.89 ng/mL 7.27 ng/mL 5.85 ng/dl 6.70  4.65   Free PSA 2.32 ng/mL  2.18 ng/mL  1.81 ng/mL 1.89 ng/dl 1.56  1.52   % Free PSA 24 % PSA  23 % PSA  25 % PSA 32 % 23  33     07/26/22  Urinalysis  Urine Appearance Clear   Urine Color Yellow   Urine Glucose Neg mg/dL  Urine Bilirubin Neg mg/dL  Urine Ketones Neg mg/dL  Urine Specific Gravity 1.020   Urine Blood Neg ery/uL  Urine pH 6.0   Urine Protein Neg mg/dL  Urine Urobilinogen 0.2 mg/dL  Urine Nitrites Neg   Urine Leukocyte Esterase Neg leu/uL   PROCEDURES:          Urinalysis Dipstick Dipstick Cont'd  Color: Yellow Bilirubin: Neg mg/dL  Appearance: Clear Ketones: Neg mg/dL  Specific Gravity: 1.020 Blood: Neg ery/uL  pH: 6.0 Protein: Neg mg/dL  Glucose: Neg mg/dL Urobilinogen: 0.2 mg/dL    Nitrites: Neg    Leukocyte Esterase: Neg leu/uL    ASSESSMENT:      ICD-10 Details  1 GU:   Prostate Cancer - C61 Chronic, Threat to Bodily Function  2 NON-GU:   Encounter for other preprocedural examination - Z01.818 Undiagnosed New Problem   PLAN:            Orders Labs Urine Culture, PSA, Total Testosterone          Schedule Return Visit/Planned Activity: Keep Scheduled Appointment - Follow up MD, Schedule Surgery          Document Letter(s):  Created for Patient: Clinical Summary         Notes:   All questions answered to the best of my ability regarding the upcoming procedure and expected postoperative course with understanding expressed by the patient. Urine culture sent today to serve as a baseline. We will also reassess PSA and testosterone levels today. Moving forward he will proceed with previously scheduled fiducial marker placement and SpaceOAR insertion on 8/24.  Next Appointment:      Next Appointment: 07/29/2022 07:30 AM    Appointment Type: Surgery     Location: [facility] [phone]    Provider: Raynelle Bring, M.D.    Reason for Visit: NE/OP FIDUCIAL MARKER AND SPACE OAR      * Signed by Jiles Crocker, NP on 07/26/22 at 10:01 AM (EDT)*

## 2022-07-29 ENCOUNTER — Ambulatory Visit (HOSPITAL_BASED_OUTPATIENT_CLINIC_OR_DEPARTMENT_OTHER): Payer: Medicare Other | Admitting: Anesthesiology

## 2022-07-29 ENCOUNTER — Encounter (HOSPITAL_BASED_OUTPATIENT_CLINIC_OR_DEPARTMENT_OTHER): Payer: Self-pay | Admitting: Urology

## 2022-07-29 ENCOUNTER — Encounter (HOSPITAL_BASED_OUTPATIENT_CLINIC_OR_DEPARTMENT_OTHER)
Admission: RE | Disposition: A | Discharge status: Home / Self Care | Payer: Self-pay | Source: Home / Self Care | Attending: Urology

## 2022-07-29 ENCOUNTER — Ambulatory Visit (HOSPITAL_BASED_OUTPATIENT_CLINIC_OR_DEPARTMENT_OTHER)
Admission: RE | Admit: 2022-07-29 | Discharge: 2022-07-29 | Disposition: A | Discharge status: Home / Self Care | Payer: Medicare Other | Attending: Urology | Admitting: Urology

## 2022-07-29 ENCOUNTER — Other Ambulatory Visit: Payer: Self-pay

## 2022-07-29 DIAGNOSIS — C61 Malignant neoplasm of prostate: Secondary | ICD-10-CM

## 2022-07-29 DIAGNOSIS — G4733 Obstructive sleep apnea (adult) (pediatric): Secondary | ICD-10-CM

## 2022-07-29 DIAGNOSIS — I517 Cardiomegaly: Secondary | ICD-10-CM | POA: Diagnosis not present

## 2022-07-29 DIAGNOSIS — Q242 Cor triatriatum: Secondary | ICD-10-CM | POA: Insufficient documentation

## 2022-07-29 DIAGNOSIS — Z8673 Personal history of transient ischemic attack (TIA), 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 residual deficits: Secondary | ICD-10-CM | POA: Diagnosis not present

## 2022-07-29 DIAGNOSIS — G4730 Sleep apnea, unspecified: Secondary | ICD-10-CM | POA: Insufficient documentation

## 2022-07-29 DIAGNOSIS — Z87891 Personal history of nicotine dependence: Secondary | ICD-10-CM | POA: Insufficient documentation

## 2022-07-29 DIAGNOSIS — Z01818 Encounter for other preprocedural examination: Secondary | ICD-10-CM

## 2022-07-29 DIAGNOSIS — Z9989 Dependence on other enabling machines and devices: Secondary | ICD-10-CM

## 2022-07-29 HISTORY — DX: Unspecified glaucoma: H40.9

## 2022-07-29 HISTORY — PX: GOLD SEED IMPLANT: SHX6343

## 2022-07-29 HISTORY — PX: SPACE OAR INSTILLATION: SHX6769

## 2022-07-29 LAB — POCT I-STAT, CHEM 8
BUN: 16 mg/dL (ref 8–23)
Calcium, Ion: 1.13 mmol/L — ABNORMAL LOW (ref 1.15–1.40)
Chloride: 105 mmol/L (ref 98–111)
Creatinine, Ser: 0.7 mg/dL (ref 0.61–1.24)
Glucose, Bld: 99 mg/dL (ref 70–99)
HCT: 40 % (ref 39.0–52.0)
Hemoglobin: 13.6 g/dL (ref 13.0–17.0)
Potassium: 3.8 mmol/L (ref 3.5–5.1)
Sodium: 141 mmol/L (ref 135–145)
TCO2: 27 mmol/L (ref 22–32)

## 2022-07-29 SURGERY — INSERTION, GOLD SEEDS
Anesthesia: Monitor Anesthesia Care | Site: Prostate

## 2022-07-29 MED ORDER — ACETAMINOPHEN 500 MG PO TABS
ORAL_TABLET | ORAL | Status: AC
  Filled 2022-07-29: qty 2

## 2022-07-29 MED ORDER — PROPOFOL 1000 MG/100ML IV EMUL
INTRAVENOUS | Status: AC
  Filled 2022-07-29: qty 300

## 2022-07-29 MED ORDER — OXYCODONE HCL 5 MG/5ML PO SOLN: 5.0000 mg | Freq: Once | ORAL | Status: DC | PRN

## 2022-07-29 MED ORDER — CEFAZOLIN SODIUM-DEXTROSE 2-4 GM/100ML-% IV SOLN
2.0000 g | Freq: Once | INTRAVENOUS | Status: AC
  Administered 2022-07-29: 2 g via INTRAVENOUS

## 2022-07-29 MED ORDER — PROPOFOL 500 MG/50ML IV EMUL
INTRAVENOUS | Status: DC | PRN
  Administered 2022-07-29: 150 ug/kg/min via INTRAVENOUS

## 2022-07-29 MED ORDER — OXYCODONE HCL 5 MG PO TABS: 5.0000 mg | ORAL_TABLET | Freq: Once | ORAL | Status: DC | PRN

## 2022-07-29 MED ORDER — PROPOFOL 10 MG/ML IV BOLUS
INTRAVENOUS | Status: DC | PRN
  Administered 2022-07-29: 20 mg via INTRAVENOUS

## 2022-07-29 MED ORDER — CEFAZOLIN SODIUM-DEXTROSE 2-4 GM/100ML-% IV SOLN
INTRAVENOUS | Status: AC
  Filled 2022-07-29: qty 100

## 2022-07-29 MED ORDER — SODIUM CHLORIDE (PF) 0.9 % IJ SOLN
INTRAMUSCULAR | Status: DC | PRN
  Administered 2022-07-29: 10 mL

## 2022-07-29 MED ORDER — LIDOCAINE 2% (20 MG/ML) 5 ML SYRINGE
INTRAMUSCULAR | Status: DC | PRN
  Administered 2022-07-29: 50 mg via INTRAVENOUS

## 2022-07-29 MED ORDER — BUPIVACAINE HCL 0.25 % IJ SOLN
INTRAMUSCULAR | Status: DC | PRN
  Administered 2022-07-29: 10 mL

## 2022-07-29 MED ORDER — FENTANYL CITRATE (PF) 100 MCG/2ML IJ SOLN
INTRAMUSCULAR | Status: AC
  Filled 2022-07-29: qty 2

## 2022-07-29 MED ORDER — LACTATED RINGERS IV SOLN: INTRAVENOUS | Status: DC

## 2022-07-29 MED ORDER — ONDANSETRON HCL 4 MG/2ML IJ SOLN
INTRAMUSCULAR | Status: DC | PRN
  Administered 2022-07-29: 4 mg via INTRAVENOUS

## 2022-07-29 MED ORDER — FENTANYL CITRATE (PF) 250 MCG/5ML IJ SOLN
INTRAMUSCULAR | Status: DC | PRN
  Administered 2022-07-29: 50 ug via INTRAVENOUS

## 2022-07-29 MED ORDER — ACETAMINOPHEN 500 MG PO TABS
1000.0000 mg | ORAL_TABLET | Freq: Once | ORAL | Status: AC
  Administered 2022-07-29: 1000 mg via ORAL

## 2022-07-29 MED ORDER — FENTANYL CITRATE (PF) 100 MCG/2ML IJ SOLN: 25.0000 ug | INTRAMUSCULAR | Status: DC | PRN

## 2022-07-29 MED ORDER — AMISULPRIDE (ANTIEMETIC) 5 MG/2ML IV SOLN
10.0000 mg | Freq: Once | INTRAVENOUS | Status: DC | PRN
Start: 2022-07-29 — End: 2022-07-29

## 2022-07-29 MED ORDER — MIDAZOLAM HCL 2 MG/2ML IJ SOLN
INTRAMUSCULAR | Status: AC
  Filled 2022-07-29: qty 2

## 2022-07-29 MED ORDER — ONDANSETRON HCL 4 MG/2ML IJ SOLN: 4.0000 mg | Freq: Once | INTRAMUSCULAR | Status: DC | PRN

## 2022-07-29 MED ORDER — MIDAZOLAM HCL 2 MG/2ML IJ SOLN
INTRAMUSCULAR | Status: DC | PRN
  Administered 2022-07-29: 1 mg via INTRAVENOUS

## 2022-07-29 MED ORDER — PROPOFOL 10 MG/ML IV BOLUS
INTRAVENOUS | Status: AC
  Filled 2022-07-29: qty 20

## 2022-07-29 MED ORDER — FLEET ENEMA 7-19 GM/118ML RE ENEM: 1.0000 | ENEMA | Freq: Once | RECTAL | Status: DC

## 2022-07-29 SURGICAL SUPPLY — 24 items

## 2022-07-29 NOTE — Interval H&P Note (Signed)
History and Physical Interval Note:  07/29/2022 7:11 AM  James Tate  has presented today for surgery, with the diagnosis of PROSTATE CANCER.  The various methods of treatment have been discussed with the patient and family. After consideration of risks, benefits and other options for treatment, the patient has consented to  Procedure(s) with comments: GOLD SEED IMPLANT (N/A) - ONLY NEEDS 30 MIN SPACE OAR INSTILLATION (N/A) as a surgical intervention.  The patient's history has been reviewed, patient examined, no change in status, stable for surgery.  I have reviewed the patient's chart and labs.  Questions were answered to the patient's satisfaction.     [employer]

## 2022-07-29 NOTE — Discharge Instructions (Addendum)
You should avoid strenuous activities today but may resume all normal activities tomorrow.  2.   You can take Tylenol as needed for any pain or discomfort.  3.    Follow up with your radiation oncologist for your simulation appointment as scheduled.  If this is not currently scheduled or you do not know the date/time for that appointment, please contact the radiation oncology office to confirm.    Post Anesthesia Home Care Instructions  Activity: Get plenty of rest for the remainder of the day. A responsible individual must stay with you for 24 hours following the procedure.  For the next 24 hours, DO NOT: -Drive a car -Operate machinery -Drink alcoholic beverages -Take any medication unless instructed by your physician -Make any legal decisions or sign important papers.  Meals: Start with liquid foods such as gelatin or soup. Progress to regular foods as tolerated. Avoid greasy, spicy, heavy foods. If nausea and/or vomiting occur, drink only clear liquids until the nausea and/or vomiting subsides. Call your physician if vomiting continues.  Special Instructions/Symptoms: Your throat may feel dry or sore from the anesthesia or the breathing tube placed in your throat during surgery. If this causes discomfort, gargle with warm salt water. The discomfort should disappear within 24 hours.  

## 2022-07-29 NOTE — Transfer of Care (Signed)
Immediate Anesthesia Transfer of Care Note  Patient: James Tate  Procedure(s) Performed: GOLD SEED IMPLANT (Prostate) SPACE OAR INSTILLATION (Prostate)  Patient Location: PACU  Anesthesia Type:MAC  Level of Consciousness: drowsy and patient cooperative  Airway & Oxygen Therapy: Patient Spontanous Breathing  Post-op Assessment: Report given to RN and Post -op Vital signs reviewed and stable  Post vital signs: Reviewed and stable  Last Vitals:  Vitals Value Taken Time  BP 101/63 07/29/22 0754  Temp    Pulse 62 07/29/22 0756  Resp 16 07/29/22 0756  SpO2 91 % 07/29/22 0756  Vitals shown include unvalidated device data.  Last Pain:  Vitals:   07/29/22 0617  TempSrc: Oral  PainSc: 0-No pain      Patients Stated Pain Goal: 5 (09/62/83 6629)  Complications: No notable events documented.

## 2022-07-29 NOTE — Anesthesia Postprocedure Evaluation (Signed)
Anesthesia Post Note  Patient: JOURDYN FERRIN  Procedure(s) Performed: GOLD SEED IMPLANT (Prostate) SPACE OAR INSTILLATION (Prostate)     Patient location during evaluation: PACU Anesthesia Type: MAC Level of consciousness: awake and alert Pain management: pain level controlled Vital Signs Assessment: post-procedure vital signs reviewed and stable Respiratory status: spontaneous breathing, nonlabored ventilation and respiratory function stable Cardiovascular status: blood pressure returned to baseline and stable Postop Assessment: no apparent nausea or vomiting Anesthetic complications: no   No notable events documented.  Last Vitals:  Vitals:   07/29/22 0815 07/29/22 0828  BP: 122/79 127/75  Pulse: (!) 55 (!) 57  Resp: 12 18  Temp:  36.6 C  SpO2: 93% 93%    Last Pain:  Vitals:   07/29/22 0828  TempSrc:   PainSc: 1                  Lidia Collum

## 2022-07-29 NOTE — Op Note (Signed)
Preoperative diagnosis: Prostate cancer   Postoperative diagnosis: Prostate cancer   Procedure:  1) Fiducial marker placement into the prostate 2) Insertion of SpaceOAR hydrogel    Surgeon: Abdulkadir Emmanuel S. Leopoldo Mazzie, Jr. M.D.   Anesthesia: IV sedation, Local anesthesia   EBL: Minimal   Complications: None   Indication: The potential risks, complications, alternative options, and expected recovery course associated with the above procedure(s) have been discussed in detail with the patient and he has provided informed consent to proceed.   Description of procedure: The patient was administered preoperative antibiotics, placed in the dorsal lithotomy position, and prepped and draped in the usual sterile fashion. A preoperative time out was performed.  Next, transrectal ultrasonography was utilized to visualize the prostate. 10 cc of 0.25% bupivacaine was then used to infiltrate the subcuateous tissue of the perineum and an additional 10 cc was injected into the lateral apical tissue surrounding the prostate for a periprostatic nerve block.  Three gold fiducial markers were then placed into the prostate via transperineal needles under ultrasound guidance at the right apex, right base, and left mid gland under direct ultrasound guidance.  A site in the midline was then selected on the perineum for placement of an 18 g needle with saline.  The needle was advanced above the rectum and below Denonvillier's fascia to the mid gland and confirmed to be in the midline on transverse imaging.  One cc of saline was injected confirming appropriate expansion of this space.  A total of 5-10 cc of saline was then injected to open the space further bilaterally.  The saline syringe was then removed and the SpaceOAR hydrogel was injected with good distribution bilaterally. He tolerated the procedure well and without complications. He was able to be awakened and transferred to the PACU in stable condition.  

## 2022-07-30 ENCOUNTER — Telehealth: Payer: Self-pay | Admitting: *Deleted

## 2022-07-30 ENCOUNTER — Encounter (HOSPITAL_BASED_OUTPATIENT_CLINIC_OR_DEPARTMENT_OTHER): Payer: Self-pay | Admitting: Urology

## 2022-07-30 NOTE — Telephone Encounter (Signed)
CALLED PATIENT TO REMIND OF SIM APPT. FOR 08-02-22- ARRIVAL TIME- 10:45 AM @ [facility], INFORMED PATIENT TO ARRIVE WITH A FULL BLADDER, SPOKE WITH PATIENT AND HE IS AWARE OF THIS APPT. AND THE INSTRUCTIONS

## 2022-08-01 NOTE — Progress Notes (Signed)
  Radiation Oncology         ([phone]) [phone] ________________________________  Name: James Tate MRN: [id number]  Date: 08/02/2022  DOB: Aug 03, 1946  SIMULATION AND TREATMENT PLANNING NOTE    ICD-10-CM   1. Malignant neoplasm of prostate ([place])  C61       DIAGNOSIS:  76 y.o. gentleman with Stage T1c adenocarcinoma of the prostate with Gleason score of 4+4, and PSA of 13.2.  NARRATIVE:  The patient was brought to the [facility].  Identity was confirmed.  All relevant records and images related to the planned course of therapy were reviewed.  The patient freely provided informed written consent to proceed with treatment after reviewing the details related to the planned course of therapy. The consent form was witnessed and verified by the simulation staff.  Then, the patient was set-up in a stable reproducible supine position for radiation therapy.  A vacuum lock pillow device was custom fabricated to position his legs in a reproducible immobilized position.  Then, I performed a urethrogram under sterile conditions to identify the prostatic bed.  CT images were obtained.  Surface markings were placed.  The CT images were loaded into the planning software.  Then the prostate bed target, pelvic lymph node target and avoidance structures including the rectum, bladder, bowel and hips were contoured.  Treatment planning then occurred.  The radiation prescription was entered and confirmed.  A total of one complex treatment devices were fabricated. I have requested : Intensity Modulated Radiotherapy (IMRT) is medically necessary for this case for the following reason:  Rectal sparing.Marland Kitchen  PLAN:  The patient will receive 45 Gy in 25 fractions of 1.8 Gy, followed by a boost to the prostate to a total dose of 75 Gy with 15 additional fractions of 2 Gy.   ________________________________  Sheral Apley Tammi Klippel, M.D.

## 2022-08-02 ENCOUNTER — Other Ambulatory Visit: Payer: Self-pay

## 2022-08-02 ENCOUNTER — Ambulatory Visit
Admission: RE | Admit: 2022-08-02 | Discharge: 2022-08-02 | Disposition: A | Discharge status: Home / Self Care | Payer: Medicare Other | Source: Ambulatory Visit | Attending: Radiation Oncology | Admitting: Radiation Oncology

## 2022-08-02 DIAGNOSIS — Z510 Encounter for antineoplastic radiation therapy: Secondary | ICD-10-CM | POA: Insufficient documentation

## 2022-08-02 DIAGNOSIS — C61 Malignant neoplasm of prostate: Secondary | ICD-10-CM

## 2022-08-02 DIAGNOSIS — Z191 Hormone sensitive malignancy status: Secondary | ICD-10-CM | POA: Diagnosis not present

## 2022-08-03 DIAGNOSIS — C61 Malignant neoplasm of prostate: Secondary | ICD-10-CM | POA: Diagnosis not present

## 2022-08-03 DIAGNOSIS — Z191 Hormone sensitive malignancy status: Secondary | ICD-10-CM | POA: Diagnosis not present

## 2022-08-03 DIAGNOSIS — Z510 Encounter for antineoplastic radiation therapy: Secondary | ICD-10-CM | POA: Diagnosis not present

## 2022-08-04 ENCOUNTER — Ambulatory Visit: Payer: Medicare Other | Admitting: Neurology

## 2022-08-12 ENCOUNTER — Ambulatory Visit
Admission: RE | Admit: 2022-08-12 | Discharge: 2022-08-12 | Disposition: A | Discharge status: Home / Self Care | Payer: Medicare Other | Source: Ambulatory Visit | Attending: Radiation Oncology | Admitting: Radiation Oncology

## 2022-08-12 ENCOUNTER — Other Ambulatory Visit: Payer: Self-pay

## 2022-08-12 DIAGNOSIS — Z510 Encounter for antineoplastic radiation therapy: Secondary | ICD-10-CM | POA: Diagnosis not present

## 2022-08-12 DIAGNOSIS — Z191 Hormone sensitive malignancy status: Secondary | ICD-10-CM | POA: Diagnosis not present

## 2022-08-12 DIAGNOSIS — C61 Malignant neoplasm of prostate: Secondary | ICD-10-CM | POA: Insufficient documentation

## 2022-08-12 LAB — RAD ONC ARIA SESSION SUMMARY
Course Elapsed Days: 0
Plan Fractions Treated to Date: 1
Plan Prescribed Dose Per Fraction: 1.8 Gy
Plan Total Fractions Prescribed: 25
Plan Total Prescribed Dose: 45 Gy
Reference Point Dosage Given to Date: 1.8 Gy
Reference Point Session Dosage Given: 1.8 Gy
Session Number: 1

## 2022-08-13 ENCOUNTER — Ambulatory Visit
Admission: RE | Admit: 2022-08-13 | Discharge: 2022-08-13 | Disposition: A | Discharge status: Home / Self Care | Payer: Medicare Other | Source: Ambulatory Visit | Attending: Radiation Oncology | Admitting: Radiation Oncology

## 2022-08-13 ENCOUNTER — Other Ambulatory Visit: Payer: Self-pay

## 2022-08-13 DIAGNOSIS — C61 Malignant neoplasm of prostate: Secondary | ICD-10-CM | POA: Diagnosis not present

## 2022-08-13 DIAGNOSIS — Z191 Hormone sensitive malignancy status: Secondary | ICD-10-CM | POA: Diagnosis not present

## 2022-08-13 DIAGNOSIS — Z510 Encounter for antineoplastic radiation therapy: Secondary | ICD-10-CM | POA: Diagnosis not present

## 2022-08-13 LAB — RAD ONC ARIA SESSION SUMMARY
Course Elapsed Days: 1
Plan Fractions Treated to Date: 2
Plan Prescribed Dose Per Fraction: 1.8 Gy
Plan Total Fractions Prescribed: 25
Plan Total Prescribed Dose: 45 Gy
Reference Point Dosage Given to Date: 3.6 Gy
Reference Point Session Dosage Given: 1.8 Gy
Session Number: 2

## 2022-08-16 ENCOUNTER — Ambulatory Visit
Admission: RE | Admit: 2022-08-16 | Discharge: 2022-08-16 | Disposition: A | Discharge status: Home / Self Care | Payer: Medicare Other | Source: Ambulatory Visit | Attending: Radiation Oncology | Admitting: Radiation Oncology

## 2022-08-16 ENCOUNTER — Other Ambulatory Visit: Payer: Self-pay

## 2022-08-16 DIAGNOSIS — Z191 Hormone sensitive malignancy status: Secondary | ICD-10-CM | POA: Diagnosis not present

## 2022-08-16 DIAGNOSIS — Z510 Encounter for antineoplastic radiation therapy: Secondary | ICD-10-CM | POA: Diagnosis not present

## 2022-08-16 DIAGNOSIS — C61 Malignant neoplasm of prostate: Secondary | ICD-10-CM | POA: Diagnosis not present

## 2022-08-16 LAB — RAD ONC ARIA SESSION SUMMARY
Course Elapsed Days: 4
Plan Fractions Treated to Date: 3
Plan Prescribed Dose Per Fraction: 1.8 Gy
Plan Total Fractions Prescribed: 25
Plan Total Prescribed Dose: 45 Gy
Reference Point Dosage Given to Date: 5.4 Gy
Reference Point Session Dosage Given: 1.8 Gy
Session Number: 3

## 2022-08-17 ENCOUNTER — Ambulatory Visit
Admission: RE | Admit: 2022-08-17 | Discharge: 2022-08-17 | Disposition: A | Discharge status: Home / Self Care | Payer: Medicare Other | Source: Ambulatory Visit | Attending: Radiation Oncology | Admitting: Radiation Oncology

## 2022-08-17 ENCOUNTER — Other Ambulatory Visit: Payer: Self-pay

## 2022-08-17 DIAGNOSIS — Z191 Hormone sensitive malignancy status: Secondary | ICD-10-CM | POA: Diagnosis not present

## 2022-08-17 DIAGNOSIS — Z510 Encounter for antineoplastic radiation therapy: Secondary | ICD-10-CM | POA: Diagnosis not present

## 2022-08-17 DIAGNOSIS — G4733 Obstructive sleep apnea (adult) (pediatric): Secondary | ICD-10-CM | POA: Diagnosis not present

## 2022-08-17 DIAGNOSIS — C61 Malignant neoplasm of prostate: Secondary | ICD-10-CM | POA: Diagnosis not present

## 2022-08-17 LAB — RAD ONC ARIA SESSION SUMMARY
Course Elapsed Days: 5
Plan Fractions Treated to Date: 4
Plan Prescribed Dose Per Fraction: 1.8 Gy
Plan Total Fractions Prescribed: 25
Plan Total Prescribed Dose: 45 Gy
Reference Point Dosage Given to Date: 7.2 Gy
Reference Point Session Dosage Given: 1.8 Gy
Session Number: 4

## 2022-08-18 ENCOUNTER — Ambulatory Visit
Admission: RE | Admit: 2022-08-18 | Discharge: 2022-08-18 | Disposition: A | Discharge status: Home / Self Care | Payer: Medicare Other | Source: Ambulatory Visit | Attending: Radiation Oncology | Admitting: Radiation Oncology

## 2022-08-18 ENCOUNTER — Other Ambulatory Visit: Payer: Self-pay

## 2022-08-18 DIAGNOSIS — Z510 Encounter for antineoplastic radiation therapy: Secondary | ICD-10-CM | POA: Diagnosis not present

## 2022-08-18 DIAGNOSIS — C61 Malignant neoplasm of prostate: Secondary | ICD-10-CM | POA: Diagnosis not present

## 2022-08-18 DIAGNOSIS — H401121 Primary open-angle glaucoma, left eye, mild stage: Secondary | ICD-10-CM | POA: Diagnosis not present

## 2022-08-18 DIAGNOSIS — H52223 Regular astigmatism, bilateral: Secondary | ICD-10-CM | POA: Diagnosis not present

## 2022-08-18 DIAGNOSIS — Z191 Hormone sensitive malignancy status: Secondary | ICD-10-CM | POA: Diagnosis not present

## 2022-08-18 DIAGNOSIS — H5213 Myopia, bilateral: Secondary | ICD-10-CM | POA: Diagnosis not present

## 2022-08-18 LAB — RAD ONC ARIA SESSION SUMMARY
Course Elapsed Days: 6
Plan Fractions Treated to Date: 5
Plan Prescribed Dose Per Fraction: 1.8 Gy
Plan Total Fractions Prescribed: 25
Plan Total Prescribed Dose: 45 Gy
Reference Point Dosage Given to Date: 9 Gy
Reference Point Session Dosage Given: 1.8 Gy
Session Number: 5

## 2022-08-19 ENCOUNTER — Other Ambulatory Visit: Payer: Self-pay

## 2022-08-19 ENCOUNTER — Ambulatory Visit
Admission: RE | Admit: 2022-08-19 | Discharge: 2022-08-19 | Disposition: A | Discharge status: Home / Self Care | Payer: Medicare Other | Source: Ambulatory Visit | Attending: Radiation Oncology | Admitting: Radiation Oncology

## 2022-08-19 DIAGNOSIS — Z191 Hormone sensitive malignancy status: Secondary | ICD-10-CM | POA: Diagnosis not present

## 2022-08-19 DIAGNOSIS — Z510 Encounter for antineoplastic radiation therapy: Secondary | ICD-10-CM | POA: Diagnosis not present

## 2022-08-19 DIAGNOSIS — C61 Malignant neoplasm of prostate: Secondary | ICD-10-CM | POA: Diagnosis not present

## 2022-08-19 LAB — RAD ONC ARIA SESSION SUMMARY
Course Elapsed Days: 7
Plan Fractions Treated to Date: 6
Plan Prescribed Dose Per Fraction: 1.8 Gy
Plan Total Fractions Prescribed: 25
Plan Total Prescribed Dose: 45 Gy
Reference Point Dosage Given to Date: 10.8 Gy
Reference Point Session Dosage Given: 1.8 Gy
Session Number: 6

## 2022-08-20 ENCOUNTER — Ambulatory Visit
Admission: RE | Admit: 2022-08-20 | Discharge: 2022-08-20 | Disposition: A | Discharge status: Home / Self Care | Payer: Medicare Other | Source: Ambulatory Visit | Attending: Radiation Oncology | Admitting: Radiation Oncology

## 2022-08-20 ENCOUNTER — Other Ambulatory Visit: Payer: Self-pay

## 2022-08-20 DIAGNOSIS — Z191 Hormone sensitive malignancy status: Secondary | ICD-10-CM | POA: Diagnosis not present

## 2022-08-20 DIAGNOSIS — C61 Malignant neoplasm of prostate: Secondary | ICD-10-CM | POA: Diagnosis not present

## 2022-08-20 DIAGNOSIS — Z510 Encounter for antineoplastic radiation therapy: Secondary | ICD-10-CM | POA: Diagnosis not present

## 2022-08-20 LAB — RAD ONC ARIA SESSION SUMMARY
Course Elapsed Days: 8
Plan Fractions Treated to Date: 7
Plan Prescribed Dose Per Fraction: 1.8 Gy
Plan Total Fractions Prescribed: 25
Plan Total Prescribed Dose: 45 Gy
Reference Point Dosage Given to Date: 12.6 Gy
Reference Point Session Dosage Given: 1.8 Gy
Session Number: 7

## 2022-08-23 ENCOUNTER — Ambulatory Visit
Admission: RE | Admit: 2022-08-23 | Discharge: 2022-08-23 | Disposition: A | Discharge status: Home / Self Care | Payer: Medicare Other | Source: Ambulatory Visit | Attending: Radiation Oncology | Admitting: Radiation Oncology

## 2022-08-23 ENCOUNTER — Other Ambulatory Visit: Payer: Self-pay

## 2022-08-23 DIAGNOSIS — C61 Malignant neoplasm of prostate: Secondary | ICD-10-CM | POA: Diagnosis not present

## 2022-08-23 DIAGNOSIS — Z510 Encounter for antineoplastic radiation therapy: Secondary | ICD-10-CM | POA: Diagnosis not present

## 2022-08-23 DIAGNOSIS — Z191 Hormone sensitive malignancy status: Secondary | ICD-10-CM | POA: Diagnosis not present

## 2022-08-23 LAB — RAD ONC ARIA SESSION SUMMARY
Course Elapsed Days: 11
Plan Fractions Treated to Date: 8
Plan Prescribed Dose Per Fraction: 1.8 Gy
Plan Total Fractions Prescribed: 25
Plan Total Prescribed Dose: 45 Gy
Reference Point Dosage Given to Date: 14.4 Gy
Reference Point Session Dosage Given: 1.8 Gy
Session Number: 8

## 2022-08-24 ENCOUNTER — Ambulatory Visit
Admission: RE | Admit: 2022-08-24 | Discharge: 2022-08-24 | Disposition: A | Discharge status: Home / Self Care | Payer: Medicare Other | Source: Ambulatory Visit | Attending: Radiation Oncology | Admitting: Radiation Oncology

## 2022-08-24 ENCOUNTER — Other Ambulatory Visit: Payer: Self-pay

## 2022-08-24 DIAGNOSIS — Z510 Encounter for antineoplastic radiation therapy: Secondary | ICD-10-CM | POA: Diagnosis not present

## 2022-08-24 DIAGNOSIS — Z191 Hormone sensitive malignancy status: Secondary | ICD-10-CM | POA: Diagnosis not present

## 2022-08-24 DIAGNOSIS — C61 Malignant neoplasm of prostate: Secondary | ICD-10-CM | POA: Diagnosis not present

## 2022-08-24 LAB — RAD ONC ARIA SESSION SUMMARY
Course Elapsed Days: 12
Plan Fractions Treated to Date: 9
Plan Prescribed Dose Per Fraction: 1.8 Gy
Plan Total Fractions Prescribed: 25
Plan Total Prescribed Dose: 45 Gy
Reference Point Dosage Given to Date: 16.2 Gy
Reference Point Session Dosage Given: 1.8 Gy
Session Number: 9

## 2022-08-25 ENCOUNTER — Other Ambulatory Visit: Payer: Self-pay

## 2022-08-25 ENCOUNTER — Ambulatory Visit
Admission: RE | Admit: 2022-08-25 | Discharge: 2022-08-25 | Disposition: A | Discharge status: Home / Self Care | Payer: Medicare Other | Source: Ambulatory Visit | Attending: Radiation Oncology | Admitting: Radiation Oncology

## 2022-08-25 DIAGNOSIS — Z191 Hormone sensitive malignancy status: Secondary | ICD-10-CM | POA: Diagnosis not present

## 2022-08-25 DIAGNOSIS — C61 Malignant neoplasm of prostate: Secondary | ICD-10-CM | POA: Diagnosis not present

## 2022-08-25 DIAGNOSIS — Z510 Encounter for antineoplastic radiation therapy: Secondary | ICD-10-CM | POA: Diagnosis not present

## 2022-08-25 LAB — RAD ONC ARIA SESSION SUMMARY
Course Elapsed Days: 13
Plan Fractions Treated to Date: 10
Plan Prescribed Dose Per Fraction: 1.8 Gy
Plan Total Fractions Prescribed: 25
Plan Total Prescribed Dose: 45 Gy
Reference Point Dosage Given to Date: 18 Gy
Reference Point Session Dosage Given: 1.8 Gy
Session Number: 10

## 2022-08-26 ENCOUNTER — Ambulatory Visit
Admission: RE | Admit: 2022-08-26 | Discharge: 2022-08-26 | Disposition: A | Discharge status: Home / Self Care | Payer: Medicare Other | Source: Ambulatory Visit | Attending: Radiation Oncology | Admitting: Radiation Oncology

## 2022-08-26 ENCOUNTER — Other Ambulatory Visit: Payer: Self-pay

## 2022-08-26 DIAGNOSIS — C61 Malignant neoplasm of prostate: Secondary | ICD-10-CM | POA: Diagnosis not present

## 2022-08-26 DIAGNOSIS — Z191 Hormone sensitive malignancy status: Secondary | ICD-10-CM | POA: Diagnosis not present

## 2022-08-26 DIAGNOSIS — Z510 Encounter for antineoplastic radiation therapy: Secondary | ICD-10-CM | POA: Diagnosis not present

## 2022-08-26 LAB — RAD ONC ARIA SESSION SUMMARY
Course Elapsed Days: 14
Plan Fractions Treated to Date: 11
Plan Prescribed Dose Per Fraction: 1.8 Gy
Plan Total Fractions Prescribed: 25
Plan Total Prescribed Dose: 45 Gy
Reference Point Dosage Given to Date: 19.8 Gy
Reference Point Session Dosage Given: 1.8 Gy
Session Number: 11

## 2022-08-27 ENCOUNTER — Other Ambulatory Visit: Payer: Self-pay

## 2022-08-27 ENCOUNTER — Ambulatory Visit
Admission: RE | Admit: 2022-08-27 | Discharge: 2022-08-27 | Disposition: A | Discharge status: Home / Self Care | Payer: Medicare Other | Source: Ambulatory Visit | Attending: Radiation Oncology | Admitting: Radiation Oncology

## 2022-08-27 DIAGNOSIS — Z191 Hormone sensitive malignancy status: Secondary | ICD-10-CM | POA: Diagnosis not present

## 2022-08-27 DIAGNOSIS — Z510 Encounter for antineoplastic radiation therapy: Secondary | ICD-10-CM | POA: Diagnosis not present

## 2022-08-27 DIAGNOSIS — C61 Malignant neoplasm of prostate: Secondary | ICD-10-CM | POA: Diagnosis not present

## 2022-08-27 LAB — RAD ONC ARIA SESSION SUMMARY
Course Elapsed Days: 15
Plan Fractions Treated to Date: 12
Plan Prescribed Dose Per Fraction: 1.8 Gy
Plan Total Fractions Prescribed: 25
Plan Total Prescribed Dose: 45 Gy
Reference Point Dosage Given to Date: 21.6 Gy
Reference Point Session Dosage Given: 1.8 Gy
Session Number: 12

## 2022-08-30 ENCOUNTER — Other Ambulatory Visit: Payer: Self-pay

## 2022-08-30 ENCOUNTER — Ambulatory Visit
Admission: RE | Admit: 2022-08-30 | Discharge: 2022-08-30 | Disposition: A | Discharge status: Home / Self Care | Payer: Medicare Other | Source: Ambulatory Visit | Attending: Radiation Oncology | Admitting: Radiation Oncology

## 2022-08-30 DIAGNOSIS — Z510 Encounter for antineoplastic radiation therapy: Secondary | ICD-10-CM | POA: Diagnosis not present

## 2022-08-30 DIAGNOSIS — Z191 Hormone sensitive malignancy status: Secondary | ICD-10-CM | POA: Diagnosis not present

## 2022-08-30 DIAGNOSIS — C61 Malignant neoplasm of prostate: Secondary | ICD-10-CM | POA: Diagnosis not present

## 2022-08-30 LAB — RAD ONC ARIA SESSION SUMMARY
Course Elapsed Days: 18
Plan Fractions Treated to Date: 13
Plan Prescribed Dose Per Fraction: 1.8 Gy
Plan Total Fractions Prescribed: 25
Plan Total Prescribed Dose: 45 Gy
Reference Point Dosage Given to Date: 23.4 Gy
Reference Point Session Dosage Given: 1.8 Gy
Session Number: 13

## 2022-08-31 ENCOUNTER — Ambulatory Visit
Admission: RE | Admit: 2022-08-31 | Discharge: 2022-08-31 | Disposition: A | Discharge status: Home / Self Care | Payer: Medicare Other | Source: Ambulatory Visit | Attending: Radiation Oncology | Admitting: Radiation Oncology

## 2022-08-31 ENCOUNTER — Other Ambulatory Visit: Payer: Self-pay

## 2022-08-31 DIAGNOSIS — Z191 Hormone sensitive malignancy status: Secondary | ICD-10-CM | POA: Diagnosis not present

## 2022-08-31 DIAGNOSIS — C61 Malignant neoplasm of prostate: Secondary | ICD-10-CM | POA: Diagnosis not present

## 2022-08-31 DIAGNOSIS — Z510 Encounter for antineoplastic radiation therapy: Secondary | ICD-10-CM | POA: Diagnosis not present

## 2022-08-31 LAB — RAD ONC ARIA SESSION SUMMARY
Course Elapsed Days: 19
Plan Fractions Treated to Date: 14
Plan Prescribed Dose Per Fraction: 1.8 Gy
Plan Total Fractions Prescribed: 25
Plan Total Prescribed Dose: 45 Gy
Reference Point Dosage Given to Date: 25.2 Gy
Reference Point Session Dosage Given: 1.8 Gy
Session Number: 14

## 2022-09-01 ENCOUNTER — Ambulatory Visit
Admission: RE | Admit: 2022-09-01 | Discharge: 2022-09-01 | Disposition: A | Discharge status: Home / Self Care | Payer: Medicare Other | Source: Ambulatory Visit | Attending: Radiation Oncology | Admitting: Radiation Oncology

## 2022-09-01 ENCOUNTER — Other Ambulatory Visit: Payer: Self-pay

## 2022-09-01 DIAGNOSIS — C61 Malignant neoplasm of prostate: Secondary | ICD-10-CM | POA: Diagnosis not present

## 2022-09-01 DIAGNOSIS — Z510 Encounter for antineoplastic radiation therapy: Secondary | ICD-10-CM | POA: Diagnosis not present

## 2022-09-01 DIAGNOSIS — Z191 Hormone sensitive malignancy status: Secondary | ICD-10-CM | POA: Diagnosis not present

## 2022-09-01 LAB — RAD ONC ARIA SESSION SUMMARY
Course Elapsed Days: 20
Plan Fractions Treated to Date: 15
Plan Prescribed Dose Per Fraction: 1.8 Gy
Plan Total Fractions Prescribed: 25
Plan Total Prescribed Dose: 45 Gy
Reference Point Dosage Given to Date: 27 Gy
Reference Point Session Dosage Given: 1.8 Gy
Session Number: 15

## 2022-09-02 ENCOUNTER — Other Ambulatory Visit: Payer: Self-pay

## 2022-09-02 ENCOUNTER — Ambulatory Visit
Admission: RE | Admit: 2022-09-02 | Discharge: 2022-09-02 | Disposition: A | Discharge status: Home / Self Care | Payer: Medicare Other | Source: Ambulatory Visit | Attending: Radiation Oncology | Admitting: Radiation Oncology

## 2022-09-02 DIAGNOSIS — C61 Malignant neoplasm of prostate: Secondary | ICD-10-CM | POA: Diagnosis not present

## 2022-09-02 DIAGNOSIS — Z191 Hormone sensitive malignancy status: Secondary | ICD-10-CM | POA: Diagnosis not present

## 2022-09-02 DIAGNOSIS — Z510 Encounter for antineoplastic radiation therapy: Secondary | ICD-10-CM | POA: Diagnosis not present

## 2022-09-02 LAB — RAD ONC ARIA SESSION SUMMARY
Course Elapsed Days: 21
Plan Fractions Treated to Date: 16
Plan Prescribed Dose Per Fraction: 1.8 Gy
Plan Total Fractions Prescribed: 25
Plan Total Prescribed Dose: 45 Gy
Reference Point Dosage Given to Date: 28.8 Gy
Reference Point Session Dosage Given: 1.8 Gy
Session Number: 16

## 2022-09-03 ENCOUNTER — Other Ambulatory Visit: Payer: Self-pay

## 2022-09-03 ENCOUNTER — Ambulatory Visit
Admission: RE | Admit: 2022-09-03 | Discharge: 2022-09-03 | Disposition: A | Discharge status: Home / Self Care | Payer: Medicare Other | Source: Ambulatory Visit | Attending: Radiation Oncology | Admitting: Radiation Oncology

## 2022-09-03 DIAGNOSIS — Z191 Hormone sensitive malignancy status: Secondary | ICD-10-CM | POA: Diagnosis not present

## 2022-09-03 DIAGNOSIS — C61 Malignant neoplasm of prostate: Secondary | ICD-10-CM | POA: Diagnosis not present

## 2022-09-03 DIAGNOSIS — Z510 Encounter for antineoplastic radiation therapy: Secondary | ICD-10-CM | POA: Diagnosis not present

## 2022-09-03 LAB — RAD ONC ARIA SESSION SUMMARY
Course Elapsed Days: 22
Plan Fractions Treated to Date: 17
Plan Prescribed Dose Per Fraction: 1.8 Gy
Plan Total Fractions Prescribed: 25
Plan Total Prescribed Dose: 45 Gy
Reference Point Dosage Given to Date: 30.6 Gy
Reference Point Session Dosage Given: 1.8 Gy
Session Number: 17

## 2022-09-06 ENCOUNTER — Ambulatory Visit
Admission: RE | Admit: 2022-09-06 | Discharge: 2022-09-06 | Disposition: A | Discharge status: Home / Self Care | Payer: Medicare Other | Source: Ambulatory Visit | Attending: Radiation Oncology | Admitting: Radiation Oncology

## 2022-09-06 ENCOUNTER — Other Ambulatory Visit: Payer: Self-pay

## 2022-09-06 DIAGNOSIS — Z510 Encounter for antineoplastic radiation therapy: Secondary | ICD-10-CM | POA: Insufficient documentation

## 2022-09-06 DIAGNOSIS — C61 Malignant neoplasm of prostate: Secondary | ICD-10-CM | POA: Diagnosis not present

## 2022-09-06 DIAGNOSIS — Z191 Hormone sensitive malignancy status: Secondary | ICD-10-CM | POA: Diagnosis not present

## 2022-09-06 LAB — RAD ONC ARIA SESSION SUMMARY
Course Elapsed Days: 25
Plan Fractions Treated to Date: 18
Plan Prescribed Dose Per Fraction: 1.8 Gy
Plan Total Fractions Prescribed: 25
Plan Total Prescribed Dose: 45 Gy
Reference Point Dosage Given to Date: 32.4 Gy
Reference Point Session Dosage Given: 1.8 Gy
Session Number: 18

## 2022-09-07 ENCOUNTER — Ambulatory Visit
Admission: RE | Admit: 2022-09-07 | Discharge: 2022-09-07 | Disposition: A | Discharge status: Home / Self Care | Payer: Medicare Other | Source: Ambulatory Visit | Attending: Radiation Oncology | Admitting: Radiation Oncology

## 2022-09-07 ENCOUNTER — Other Ambulatory Visit: Payer: Self-pay

## 2022-09-07 DIAGNOSIS — Z191 Hormone sensitive malignancy status: Secondary | ICD-10-CM | POA: Diagnosis not present

## 2022-09-07 DIAGNOSIS — Z510 Encounter for antineoplastic radiation therapy: Secondary | ICD-10-CM | POA: Diagnosis not present

## 2022-09-07 DIAGNOSIS — C61 Malignant neoplasm of prostate: Secondary | ICD-10-CM | POA: Diagnosis not present

## 2022-09-07 LAB — RAD ONC ARIA SESSION SUMMARY
Course Elapsed Days: 26
Plan Fractions Treated to Date: 19
Plan Prescribed Dose Per Fraction: 1.8 Gy
Plan Total Fractions Prescribed: 25
Plan Total Prescribed Dose: 45 Gy
Reference Point Dosage Given to Date: 34.2 Gy
Reference Point Session Dosage Given: 1.8 Gy
Session Number: 19

## 2022-09-08 ENCOUNTER — Other Ambulatory Visit: Payer: Self-pay

## 2022-09-08 ENCOUNTER — Ambulatory Visit
Admission: RE | Admit: 2022-09-08 | Discharge: 2022-09-08 | Disposition: A | Discharge status: Home / Self Care | Payer: Medicare Other | Source: Ambulatory Visit | Attending: Radiation Oncology | Admitting: Radiation Oncology

## 2022-09-08 DIAGNOSIS — Z510 Encounter for antineoplastic radiation therapy: Secondary | ICD-10-CM | POA: Diagnosis not present

## 2022-09-08 DIAGNOSIS — C61 Malignant neoplasm of prostate: Secondary | ICD-10-CM | POA: Diagnosis not present

## 2022-09-08 DIAGNOSIS — Z191 Hormone sensitive malignancy status: Secondary | ICD-10-CM | POA: Diagnosis not present

## 2022-09-08 LAB — RAD ONC ARIA SESSION SUMMARY
Course Elapsed Days: 27
Plan Fractions Treated to Date: 20
Plan Prescribed Dose Per Fraction: 1.8 Gy
Plan Total Fractions Prescribed: 25
Plan Total Prescribed Dose: 45 Gy
Reference Point Dosage Given to Date: 36 Gy
Reference Point Session Dosage Given: 1.8 Gy
Session Number: 20

## 2022-09-09 ENCOUNTER — Ambulatory Visit
Admission: RE | Admit: 2022-09-09 | Discharge: 2022-09-09 | Disposition: A | Discharge status: Home / Self Care | Payer: Medicare Other | Source: Ambulatory Visit | Attending: Radiation Oncology | Admitting: Radiation Oncology

## 2022-09-09 ENCOUNTER — Other Ambulatory Visit: Payer: Self-pay

## 2022-09-09 DIAGNOSIS — Z510 Encounter for antineoplastic radiation therapy: Secondary | ICD-10-CM | POA: Diagnosis not present

## 2022-09-09 DIAGNOSIS — C61 Malignant neoplasm of prostate: Secondary | ICD-10-CM | POA: Diagnosis not present

## 2022-09-09 DIAGNOSIS — Z191 Hormone sensitive malignancy status: Secondary | ICD-10-CM | POA: Diagnosis not present

## 2022-09-09 LAB — RAD ONC ARIA SESSION SUMMARY
Course Elapsed Days: 28
Plan Fractions Treated to Date: 21
Plan Prescribed Dose Per Fraction: 1.8 Gy
Plan Total Fractions Prescribed: 25
Plan Total Prescribed Dose: 45 Gy
Reference Point Dosage Given to Date: 37.8 Gy
Reference Point Session Dosage Given: 1.8 Gy
Session Number: 21

## 2022-09-10 ENCOUNTER — Other Ambulatory Visit: Payer: Self-pay

## 2022-09-10 ENCOUNTER — Ambulatory Visit
Admission: RE | Admit: 2022-09-10 | Discharge: 2022-09-10 | Disposition: A | Discharge status: Home / Self Care | Payer: Medicare Other | Source: Ambulatory Visit | Attending: Radiation Oncology | Admitting: Radiation Oncology

## 2022-09-10 DIAGNOSIS — Z510 Encounter for antineoplastic radiation therapy: Secondary | ICD-10-CM | POA: Diagnosis not present

## 2022-09-10 DIAGNOSIS — C61 Malignant neoplasm of prostate: Secondary | ICD-10-CM | POA: Diagnosis not present

## 2022-09-10 DIAGNOSIS — Z191 Hormone sensitive malignancy status: Secondary | ICD-10-CM | POA: Diagnosis not present

## 2022-09-10 LAB — RAD ONC ARIA SESSION SUMMARY
Course Elapsed Days: 29
Plan Fractions Treated to Date: 22
Plan Prescribed Dose Per Fraction: 1.8 Gy
Plan Total Fractions Prescribed: 25
Plan Total Prescribed Dose: 45 Gy
Reference Point Dosage Given to Date: 39.6 Gy
Reference Point Session Dosage Given: 1.8 Gy
Session Number: 22

## 2022-09-13 ENCOUNTER — Other Ambulatory Visit: Payer: Self-pay

## 2022-09-13 ENCOUNTER — Ambulatory Visit
Admission: RE | Admit: 2022-09-13 | Discharge: 2022-09-13 | Disposition: A | Discharge status: Home / Self Care | Payer: Medicare Other | Source: Ambulatory Visit | Attending: Radiation Oncology | Admitting: Radiation Oncology

## 2022-09-13 DIAGNOSIS — Z191 Hormone sensitive malignancy status: Secondary | ICD-10-CM | POA: Diagnosis not present

## 2022-09-13 DIAGNOSIS — Z510 Encounter for antineoplastic radiation therapy: Secondary | ICD-10-CM | POA: Diagnosis not present

## 2022-09-13 DIAGNOSIS — C61 Malignant neoplasm of prostate: Secondary | ICD-10-CM | POA: Diagnosis not present

## 2022-09-13 LAB — RAD ONC ARIA SESSION SUMMARY
Course Elapsed Days: 32
Plan Fractions Treated to Date: 23
Plan Prescribed Dose Per Fraction: 1.8 Gy
Plan Total Fractions Prescribed: 25
Plan Total Prescribed Dose: 45 Gy
Reference Point Dosage Given to Date: 41.4 Gy
Reference Point Session Dosage Given: 1.8 Gy
Session Number: 23

## 2022-09-14 ENCOUNTER — Ambulatory Visit
Admission: RE | Admit: 2022-09-14 | Discharge: 2022-09-14 | Disposition: A | Discharge status: Home / Self Care | Payer: Medicare Other | Source: Ambulatory Visit | Attending: Radiation Oncology | Admitting: Radiation Oncology

## 2022-09-14 ENCOUNTER — Other Ambulatory Visit: Payer: Self-pay

## 2022-09-14 DIAGNOSIS — C61 Malignant neoplasm of prostate: Secondary | ICD-10-CM | POA: Diagnosis not present

## 2022-09-14 DIAGNOSIS — Z510 Encounter for antineoplastic radiation therapy: Secondary | ICD-10-CM | POA: Diagnosis not present

## 2022-09-14 DIAGNOSIS — Z191 Hormone sensitive malignancy status: Secondary | ICD-10-CM | POA: Diagnosis not present

## 2022-09-14 LAB — RAD ONC ARIA SESSION SUMMARY
Course Elapsed Days: 33
Plan Fractions Treated to Date: 24
Plan Prescribed Dose Per Fraction: 1.8 Gy
Plan Total Fractions Prescribed: 25
Plan Total Prescribed Dose: 45 Gy
Reference Point Dosage Given to Date: 43.2 Gy
Reference Point Session Dosage Given: 1.8 Gy
Session Number: 24

## 2022-09-15 ENCOUNTER — Ambulatory Visit
Admission: RE | Admit: 2022-09-15 | Discharge: 2022-09-15 | Disposition: A | Discharge status: Home / Self Care | Payer: Medicare Other | Source: Ambulatory Visit | Attending: Radiation Oncology | Admitting: Radiation Oncology

## 2022-09-15 ENCOUNTER — Other Ambulatory Visit: Payer: Self-pay

## 2022-09-15 DIAGNOSIS — C61 Malignant neoplasm of prostate: Secondary | ICD-10-CM | POA: Diagnosis not present

## 2022-09-15 DIAGNOSIS — Z191 Hormone sensitive malignancy status: Secondary | ICD-10-CM | POA: Diagnosis not present

## 2022-09-15 DIAGNOSIS — Z510 Encounter for antineoplastic radiation therapy: Secondary | ICD-10-CM | POA: Diagnosis not present

## 2022-09-15 LAB — RAD ONC ARIA SESSION SUMMARY
Course Elapsed Days: 34
Plan Fractions Treated to Date: 25
Plan Prescribed Dose Per Fraction: 1.8 Gy
Plan Total Fractions Prescribed: 25
Plan Total Prescribed Dose: 45 Gy
Reference Point Dosage Given to Date: 45 Gy
Reference Point Session Dosage Given: 1.8 Gy
Session Number: 25

## 2022-09-16 ENCOUNTER — Ambulatory Visit
Admission: RE | Admit: 2022-09-16 | Discharge: 2022-09-16 | Disposition: A | Discharge status: Home / Self Care | Payer: Medicare Other | Source: Ambulatory Visit | Attending: Radiation Oncology | Admitting: Radiation Oncology

## 2022-09-16 ENCOUNTER — Other Ambulatory Visit: Payer: Self-pay

## 2022-09-16 DIAGNOSIS — Z191 Hormone sensitive malignancy status: Secondary | ICD-10-CM | POA: Diagnosis not present

## 2022-09-16 DIAGNOSIS — Z510 Encounter for antineoplastic radiation therapy: Secondary | ICD-10-CM | POA: Diagnosis not present

## 2022-09-16 DIAGNOSIS — C61 Malignant neoplasm of prostate: Secondary | ICD-10-CM | POA: Diagnosis not present

## 2022-09-16 LAB — RAD ONC ARIA SESSION SUMMARY
Course Elapsed Days: 35
Plan Fractions Treated to Date: 1
Plan Prescribed Dose Per Fraction: 2 Gy
Plan Total Fractions Prescribed: 15
Plan Total Prescribed Dose: 30 Gy
Reference Point Dosage Given to Date: 2 Gy
Reference Point Session Dosage Given: 2 Gy
Session Number: 26

## 2022-09-17 ENCOUNTER — Ambulatory Visit
Admission: RE | Admit: 2022-09-17 | Discharge: 2022-09-17 | Disposition: A | Discharge status: Home / Self Care | Payer: Medicare Other | Source: Ambulatory Visit | Attending: Radiation Oncology | Admitting: Radiation Oncology

## 2022-09-17 ENCOUNTER — Other Ambulatory Visit: Payer: Self-pay

## 2022-09-17 DIAGNOSIS — C61 Malignant neoplasm of prostate: Secondary | ICD-10-CM | POA: Diagnosis not present

## 2022-09-17 DIAGNOSIS — Z510 Encounter for antineoplastic radiation therapy: Secondary | ICD-10-CM | POA: Diagnosis not present

## 2022-09-17 DIAGNOSIS — Z191 Hormone sensitive malignancy status: Secondary | ICD-10-CM | POA: Diagnosis not present

## 2022-09-17 LAB — RAD ONC ARIA SESSION SUMMARY
Course Elapsed Days: 36
Plan Fractions Treated to Date: 2
Plan Prescribed Dose Per Fraction: 2 Gy
Plan Total Fractions Prescribed: 15
Plan Total Prescribed Dose: 30 Gy
Reference Point Dosage Given to Date: 4 Gy
Reference Point Session Dosage Given: 2 Gy
Session Number: 27

## 2022-09-20 ENCOUNTER — Other Ambulatory Visit: Payer: Self-pay

## 2022-09-20 ENCOUNTER — Ambulatory Visit
Admission: RE | Admit: 2022-09-20 | Discharge: 2022-09-20 | Disposition: A | Discharge status: Home / Self Care | Payer: Medicare Other | Source: Ambulatory Visit | Attending: Radiation Oncology | Admitting: Radiation Oncology

## 2022-09-20 DIAGNOSIS — Z191 Hormone sensitive malignancy status: Secondary | ICD-10-CM | POA: Diagnosis not present

## 2022-09-20 DIAGNOSIS — C61 Malignant neoplasm of prostate: Secondary | ICD-10-CM | POA: Diagnosis not present

## 2022-09-20 DIAGNOSIS — Z510 Encounter for antineoplastic radiation therapy: Secondary | ICD-10-CM | POA: Diagnosis not present

## 2022-09-20 LAB — RAD ONC ARIA SESSION SUMMARY
Course Elapsed Days: 39
Plan Fractions Treated to Date: 3
Plan Prescribed Dose Per Fraction: 2 Gy
Plan Total Fractions Prescribed: 15
Plan Total Prescribed Dose: 30 Gy
Reference Point Dosage Given to Date: 6 Gy
Reference Point Session Dosage Given: 2 Gy
Session Number: 28

## 2022-09-21 ENCOUNTER — Other Ambulatory Visit: Payer: Self-pay

## 2022-09-21 ENCOUNTER — Ambulatory Visit
Admission: RE | Admit: 2022-09-21 | Discharge: 2022-09-21 | Disposition: A | Discharge status: Home / Self Care | Payer: Medicare Other | Source: Ambulatory Visit | Attending: Radiation Oncology | Admitting: Radiation Oncology

## 2022-09-21 DIAGNOSIS — C61 Malignant neoplasm of prostate: Secondary | ICD-10-CM | POA: Diagnosis not present

## 2022-09-21 DIAGNOSIS — Z510 Encounter for antineoplastic radiation therapy: Secondary | ICD-10-CM | POA: Diagnosis not present

## 2022-09-21 DIAGNOSIS — Z191 Hormone sensitive malignancy status: Secondary | ICD-10-CM | POA: Diagnosis not present

## 2022-09-21 LAB — RAD ONC ARIA SESSION SUMMARY
Course Elapsed Days: 40
Plan Fractions Treated to Date: 4
Plan Prescribed Dose Per Fraction: 2 Gy
Plan Total Fractions Prescribed: 15
Plan Total Prescribed Dose: 30 Gy
Reference Point Dosage Given to Date: 8 Gy
Reference Point Session Dosage Given: 2 Gy
Session Number: 29

## 2022-09-22 ENCOUNTER — Other Ambulatory Visit: Payer: Medicare Other

## 2022-09-22 ENCOUNTER — Other Ambulatory Visit: Payer: Self-pay

## 2022-09-22 ENCOUNTER — Ambulatory Visit
Admission: RE | Admit: 2022-09-22 | Discharge: 2022-09-22 | Disposition: A | Discharge status: Home / Self Care | Payer: Medicare Other | Source: Ambulatory Visit | Attending: Radiation Oncology | Admitting: Radiation Oncology

## 2022-09-22 DIAGNOSIS — Z510 Encounter for antineoplastic radiation therapy: Secondary | ICD-10-CM | POA: Diagnosis not present

## 2022-09-22 DIAGNOSIS — Z191 Hormone sensitive malignancy status: Secondary | ICD-10-CM | POA: Diagnosis not present

## 2022-09-22 DIAGNOSIS — C61 Malignant neoplasm of prostate: Secondary | ICD-10-CM | POA: Diagnosis not present

## 2022-09-22 LAB — RAD ONC ARIA SESSION SUMMARY
Course Elapsed Days: 41
Plan Fractions Treated to Date: 5
Plan Prescribed Dose Per Fraction: 2 Gy
Plan Total Fractions Prescribed: 15
Plan Total Prescribed Dose: 30 Gy
Reference Point Dosage Given to Date: 10 Gy
Reference Point Session Dosage Given: 2 Gy
Session Number: 30

## 2022-09-23 ENCOUNTER — Ambulatory Visit
Admission: RE | Admit: 2022-09-23 | Discharge: 2022-09-23 | Disposition: A | Discharge status: Home / Self Care | Payer: Medicare Other | Source: Ambulatory Visit | Attending: Radiation Oncology | Admitting: Radiation Oncology

## 2022-09-23 ENCOUNTER — Other Ambulatory Visit: Payer: Self-pay

## 2022-09-23 DIAGNOSIS — Z191 Hormone sensitive malignancy status: Secondary | ICD-10-CM | POA: Diagnosis not present

## 2022-09-23 DIAGNOSIS — C61 Malignant neoplasm of prostate: Secondary | ICD-10-CM | POA: Diagnosis not present

## 2022-09-23 DIAGNOSIS — Z510 Encounter for antineoplastic radiation therapy: Secondary | ICD-10-CM | POA: Diagnosis not present

## 2022-09-23 LAB — RAD ONC ARIA SESSION SUMMARY
Course Elapsed Days: 42
Plan Fractions Treated to Date: 6
Plan Prescribed Dose Per Fraction: 2 Gy
Plan Total Fractions Prescribed: 15
Plan Total Prescribed Dose: 30 Gy
Reference Point Dosage Given to Date: 12 Gy
Reference Point Session Dosage Given: 2 Gy
Session Number: 31

## 2022-09-24 ENCOUNTER — Other Ambulatory Visit: Payer: Self-pay

## 2022-09-24 ENCOUNTER — Ambulatory Visit
Admission: RE | Admit: 2022-09-24 | Discharge: 2022-09-24 | Disposition: A | Discharge status: Home / Self Care | Payer: Medicare Other | Source: Ambulatory Visit | Attending: Radiation Oncology | Admitting: Radiation Oncology

## 2022-09-24 DIAGNOSIS — C61 Malignant neoplasm of prostate: Secondary | ICD-10-CM | POA: Diagnosis not present

## 2022-09-24 DIAGNOSIS — Z191 Hormone sensitive malignancy status: Secondary | ICD-10-CM | POA: Diagnosis not present

## 2022-09-24 DIAGNOSIS — Z510 Encounter for antineoplastic radiation therapy: Secondary | ICD-10-CM | POA: Diagnosis not present

## 2022-09-24 LAB — RAD ONC ARIA SESSION SUMMARY
Course Elapsed Days: 43
Plan Fractions Treated to Date: 7
Plan Prescribed Dose Per Fraction: 2 Gy
Plan Total Fractions Prescribed: 15
Plan Total Prescribed Dose: 30 Gy
Reference Point Dosage Given to Date: 14 Gy
Reference Point Session Dosage Given: 2 Gy
Session Number: 32

## 2022-09-27 ENCOUNTER — Other Ambulatory Visit: Payer: Self-pay

## 2022-09-27 ENCOUNTER — Ambulatory Visit
Admission: RE | Admit: 2022-09-27 | Discharge: 2022-09-27 | Disposition: A | Discharge status: Home / Self Care | Payer: Medicare Other | Source: Ambulatory Visit | Attending: Radiation Oncology | Admitting: Radiation Oncology

## 2022-09-27 DIAGNOSIS — C61 Malignant neoplasm of prostate: Secondary | ICD-10-CM | POA: Diagnosis not present

## 2022-09-27 DIAGNOSIS — Z510 Encounter for antineoplastic radiation therapy: Secondary | ICD-10-CM | POA: Diagnosis not present

## 2022-09-27 DIAGNOSIS — Z191 Hormone sensitive malignancy status: Secondary | ICD-10-CM | POA: Diagnosis not present

## 2022-09-27 LAB — RAD ONC ARIA SESSION SUMMARY
Course Elapsed Days: 46
Plan Fractions Treated to Date: 8
Plan Prescribed Dose Per Fraction: 2 Gy
Plan Total Fractions Prescribed: 15
Plan Total Prescribed Dose: 30 Gy
Reference Point Dosage Given to Date: 16 Gy
Reference Point Session Dosage Given: 2 Gy
Session Number: 33

## 2022-09-28 ENCOUNTER — Other Ambulatory Visit: Payer: Self-pay

## 2022-09-28 ENCOUNTER — Ambulatory Visit
Admission: RE | Admit: 2022-09-28 | Discharge: 2022-09-28 | Disposition: A | Discharge status: Home / Self Care | Payer: Medicare Other | Source: Ambulatory Visit | Attending: Radiation Oncology | Admitting: Radiation Oncology

## 2022-09-28 DIAGNOSIS — Z191 Hormone sensitive malignancy status: Secondary | ICD-10-CM | POA: Diagnosis not present

## 2022-09-28 DIAGNOSIS — C61 Malignant neoplasm of prostate: Secondary | ICD-10-CM | POA: Diagnosis not present

## 2022-09-28 DIAGNOSIS — Z510 Encounter for antineoplastic radiation therapy: Secondary | ICD-10-CM | POA: Diagnosis not present

## 2022-09-28 LAB — RAD ONC ARIA SESSION SUMMARY
Course Elapsed Days: 47
Plan Fractions Treated to Date: 9
Plan Prescribed Dose Per Fraction: 2 Gy
Plan Total Fractions Prescribed: 15
Plan Total Prescribed Dose: 30 Gy
Reference Point Dosage Given to Date: 18 Gy
Reference Point Session Dosage Given: 2 Gy
Session Number: 34

## 2022-09-29 ENCOUNTER — Ambulatory Visit
Admission: RE | Admit: 2022-09-29 | Discharge: 2022-09-29 | Disposition: A | Discharge status: Home / Self Care | Payer: Medicare Other | Source: Ambulatory Visit | Attending: Radiation Oncology | Admitting: Radiation Oncology

## 2022-09-29 ENCOUNTER — Other Ambulatory Visit: Payer: Self-pay

## 2022-09-29 DIAGNOSIS — Z510 Encounter for antineoplastic radiation therapy: Secondary | ICD-10-CM | POA: Diagnosis not present

## 2022-09-29 DIAGNOSIS — Z191 Hormone sensitive malignancy status: Secondary | ICD-10-CM | POA: Diagnosis not present

## 2022-09-29 DIAGNOSIS — C61 Malignant neoplasm of prostate: Secondary | ICD-10-CM | POA: Diagnosis not present

## 2022-09-29 LAB — RAD ONC ARIA SESSION SUMMARY
Course Elapsed Days: 48
Plan Fractions Treated to Date: 10
Plan Prescribed Dose Per Fraction: 2 Gy
Plan Total Fractions Prescribed: 15
Plan Total Prescribed Dose: 30 Gy
Reference Point Dosage Given to Date: 20 Gy
Reference Point Session Dosage Given: 2 Gy
Session Number: 35

## 2022-09-30 ENCOUNTER — Other Ambulatory Visit: Payer: Self-pay

## 2022-09-30 ENCOUNTER — Ambulatory Visit
Admission: RE | Admit: 2022-09-30 | Discharge: 2022-09-30 | Disposition: A | Discharge status: Home / Self Care | Payer: Medicare Other | Source: Ambulatory Visit | Attending: Radiation Oncology | Admitting: Radiation Oncology

## 2022-09-30 DIAGNOSIS — Z510 Encounter for antineoplastic radiation therapy: Secondary | ICD-10-CM | POA: Diagnosis not present

## 2022-09-30 DIAGNOSIS — Z191 Hormone sensitive malignancy status: Secondary | ICD-10-CM | POA: Diagnosis not present

## 2022-09-30 DIAGNOSIS — C61 Malignant neoplasm of prostate: Secondary | ICD-10-CM | POA: Diagnosis not present

## 2022-09-30 LAB — RAD ONC ARIA SESSION SUMMARY
Course Elapsed Days: 49
Plan Fractions Treated to Date: 11
Plan Prescribed Dose Per Fraction: 2 Gy
Plan Total Fractions Prescribed: 15
Plan Total Prescribed Dose: 30 Gy
Reference Point Dosage Given to Date: 22 Gy
Reference Point Session Dosage Given: 2 Gy
Session Number: 36

## 2022-10-01 ENCOUNTER — Other Ambulatory Visit: Payer: Self-pay

## 2022-10-01 ENCOUNTER — Ambulatory Visit
Admission: RE | Admit: 2022-10-01 | Discharge: 2022-10-01 | Disposition: A | Discharge status: Home / Self Care | Payer: Medicare Other | Source: Ambulatory Visit | Attending: Radiation Oncology | Admitting: Radiation Oncology

## 2022-10-01 ENCOUNTER — Ambulatory Visit: Admission: RE | Admit: 2022-10-01 | Payer: Medicare Other | Source: Ambulatory Visit

## 2022-10-01 DIAGNOSIS — C61 Malignant neoplasm of prostate: Secondary | ICD-10-CM | POA: Diagnosis not present

## 2022-10-01 DIAGNOSIS — Z510 Encounter for antineoplastic radiation therapy: Secondary | ICD-10-CM | POA: Diagnosis not present

## 2022-10-01 DIAGNOSIS — Z191 Hormone sensitive malignancy status: Secondary | ICD-10-CM | POA: Diagnosis not present

## 2022-10-01 LAB — RAD ONC ARIA SESSION SUMMARY
Course Elapsed Days: 50
Plan Fractions Treated to Date: 12
Plan Prescribed Dose Per Fraction: 2 Gy
Plan Total Fractions Prescribed: 15
Plan Total Prescribed Dose: 30 Gy
Reference Point Dosage Given to Date: 24 Gy
Reference Point Session Dosage Given: 2 Gy
Session Number: 37

## 2022-10-04 ENCOUNTER — Other Ambulatory Visit: Payer: Self-pay

## 2022-10-04 ENCOUNTER — Ambulatory Visit
Admission: RE | Admit: 2022-10-04 | Discharge: 2022-10-04 | Disposition: A | Discharge status: Home / Self Care | Payer: Medicare Other | Source: Ambulatory Visit | Attending: Radiation Oncology | Admitting: Radiation Oncology

## 2022-10-04 DIAGNOSIS — Z510 Encounter for antineoplastic radiation therapy: Secondary | ICD-10-CM | POA: Diagnosis not present

## 2022-10-04 DIAGNOSIS — Z191 Hormone sensitive malignancy status: Secondary | ICD-10-CM | POA: Diagnosis not present

## 2022-10-04 DIAGNOSIS — C61 Malignant neoplasm of prostate: Secondary | ICD-10-CM | POA: Diagnosis not present

## 2022-10-04 LAB — RAD ONC ARIA SESSION SUMMARY
Course Elapsed Days: 53
Plan Fractions Treated to Date: 13
Plan Prescribed Dose Per Fraction: 2 Gy
Plan Total Fractions Prescribed: 15
Plan Total Prescribed Dose: 30 Gy
Reference Point Dosage Given to Date: 26 Gy
Reference Point Session Dosage Given: 2 Gy
Session Number: 38

## 2022-10-05 ENCOUNTER — Ambulatory Visit
Admission: RE | Admit: 2022-10-05 | Discharge: 2022-10-05 | Disposition: A | Discharge status: Home / Self Care | Payer: Medicare Other | Source: Ambulatory Visit | Attending: Radiation Oncology | Admitting: Radiation Oncology

## 2022-10-05 ENCOUNTER — Other Ambulatory Visit: Payer: Self-pay

## 2022-10-05 DIAGNOSIS — Z191 Hormone sensitive malignancy status: Secondary | ICD-10-CM | POA: Diagnosis not present

## 2022-10-05 DIAGNOSIS — C61 Malignant neoplasm of prostate: Secondary | ICD-10-CM | POA: Diagnosis not present

## 2022-10-05 DIAGNOSIS — Z510 Encounter for antineoplastic radiation therapy: Secondary | ICD-10-CM | POA: Diagnosis not present

## 2022-10-05 LAB — RAD ONC ARIA SESSION SUMMARY
Course Elapsed Days: 54
Plan Fractions Treated to Date: 14
Plan Prescribed Dose Per Fraction: 2 Gy
Plan Total Fractions Prescribed: 15
Plan Total Prescribed Dose: 30 Gy
Reference Point Dosage Given to Date: 28 Gy
Reference Point Session Dosage Given: 2 Gy
Session Number: 39

## 2022-10-06 ENCOUNTER — Other Ambulatory Visit: Payer: Self-pay

## 2022-10-06 ENCOUNTER — Encounter: Payer: Self-pay | Admitting: Urology

## 2022-10-06 ENCOUNTER — Ambulatory Visit
Admission: RE | Admit: 2022-10-06 | Discharge: 2022-10-06 | Disposition: A | Discharge status: Home / Self Care | Payer: Medicare Other | Source: Ambulatory Visit | Attending: Radiation Oncology | Admitting: Radiation Oncology

## 2022-10-06 DIAGNOSIS — Z510 Encounter for antineoplastic radiation therapy: Secondary | ICD-10-CM | POA: Diagnosis not present

## 2022-10-06 DIAGNOSIS — C61 Malignant neoplasm of prostate: Secondary | ICD-10-CM | POA: Insufficient documentation

## 2022-10-06 DIAGNOSIS — Z191 Hormone sensitive malignancy status: Secondary | ICD-10-CM | POA: Diagnosis not present

## 2022-10-06 LAB — RAD ONC ARIA SESSION SUMMARY
Course Elapsed Days: 55
Plan Fractions Treated to Date: 15
Plan Prescribed Dose Per Fraction: 2 Gy
Plan Total Fractions Prescribed: 15
Plan Total Prescribed Dose: 30 Gy
Reference Point Dosage Given to Date: 30 Gy
Reference Point Session Dosage Given: 2 Gy
Session Number: 40

## 2022-10-14 DIAGNOSIS — R972 Elevated prostate specific antigen [PSA]: Secondary | ICD-10-CM | POA: Diagnosis not present

## 2022-10-15 ENCOUNTER — Encounter: Payer: Self-pay | Admitting: Cardiovascular Disease

## 2022-10-24 DIAGNOSIS — Z23 Encounter for immunization: Secondary | ICD-10-CM | POA: Diagnosis not present

## 2022-11-02 ENCOUNTER — Ambulatory Visit
Admission: RE | Admit: 2022-11-02 | Discharge: 2022-11-02 | Disposition: A | Discharge status: Home / Self Care | Payer: Medicare Other | Source: Ambulatory Visit | Attending: Radiation Oncology | Admitting: Radiation Oncology

## 2022-11-02 NOTE — Progress Notes (Signed)
  Radiation Oncology         [phone] ________________________________  Name: James Tate MRN: [id number]  Date of Service: 11/02/2022  DOB: [birth year]  Post Treatment Telephone Note  Diagnosis:  76 y.o. gentleman with Stage T1c adenocarcinoma of the prostate with Gleason score of 4+4, and PSA of 13.2.(as documented in provider EOT note)   Pre Treatment IPSS Score: 16 (as documented in the provider consult note)   The patient was not available for call today. A voicemail was left.  Patient has a scheduled follow up visit with his urologist, Dr. Lovena Neighbours, on 11/04/2022 at [facility] for ongoing surveillance. He was counseled that PSA levels will be drawn in the urology office, and was reassured that additional time is expected to improve bowel and bladder symptoms. He was encouraged to call back with concerns or questions regarding radiation.  This concludes the interview.   Leandra Kern, LPN

## 2022-11-04 DIAGNOSIS — R310 Gross hematuria: Secondary | ICD-10-CM | POA: Diagnosis not present

## 2022-11-04 DIAGNOSIS — C61 Malignant neoplasm of prostate: Secondary | ICD-10-CM | POA: Diagnosis not present

## 2022-11-09 DIAGNOSIS — M25561 Pain in right knee: Secondary | ICD-10-CM | POA: Diagnosis not present

## 2022-11-09 DIAGNOSIS — M1711 Unilateral primary osteoarthritis, right knee: Secondary | ICD-10-CM | POA: Diagnosis not present

## 2022-11-09 DIAGNOSIS — Z96652 Presence of left artificial knee joint: Secondary | ICD-10-CM | POA: Diagnosis not present

## 2022-11-12 DIAGNOSIS — Z23 Encounter for immunization: Secondary | ICD-10-CM | POA: Diagnosis not present

## 2022-11-17 DIAGNOSIS — H401121 Primary open-angle glaucoma, left eye, mild stage: Secondary | ICD-10-CM | POA: Diagnosis not present

## 2022-11-17 DIAGNOSIS — H5213 Myopia, bilateral: Secondary | ICD-10-CM | POA: Diagnosis not present

## 2022-11-17 DIAGNOSIS — H52223 Regular astigmatism, bilateral: Secondary | ICD-10-CM | POA: Diagnosis not present

## 2022-11-22 DIAGNOSIS — M5416 Radiculopathy, lumbar region: Secondary | ICD-10-CM | POA: Diagnosis not present

## 2022-12-13 ENCOUNTER — Telehealth: Payer: Self-pay | Admitting: Pharmacist

## 2022-12-13 DIAGNOSIS — H401131 Primary open-angle glaucoma, bilateral, mild stage: Secondary | ICD-10-CM | POA: Diagnosis not present

## 2022-12-13 DIAGNOSIS — H2513 Age-related nuclear cataract, bilateral: Secondary | ICD-10-CM | POA: Diagnosis not present

## 2022-12-13 MED ORDER — REPATHA SURECLICK 140 MG/ML ~~LOC~~ SOAJ: 1.0000 | SUBCUTANEOUS | 11 refills | Status: DC

## 2022-12-13 NOTE — Telephone Encounter (Signed)
Pt's insurance covers [place], not Praluent this year. Prior Josem Kaufmann has been submitted, Key: [id number], and approved through 12/13/23. New rx sent to pharmacy, left a message for pt making him aware.

## 2022-12-15 DIAGNOSIS — L814 Other melanin hyperpigmentation: Secondary | ICD-10-CM | POA: Diagnosis not present

## 2022-12-15 DIAGNOSIS — D225 Melanocytic nevi of trunk: Secondary | ICD-10-CM | POA: Diagnosis not present

## 2022-12-15 DIAGNOSIS — L821 Other seborrheic keratosis: Secondary | ICD-10-CM | POA: Diagnosis not present

## 2022-12-15 DIAGNOSIS — Z08 Encounter for follow-up examination after completed treatment for malignant neoplasm: Secondary | ICD-10-CM | POA: Diagnosis not present

## 2022-12-15 DIAGNOSIS — L819 Disorder of pigmentation, unspecified: Secondary | ICD-10-CM | POA: Diagnosis not present

## 2022-12-15 DIAGNOSIS — Z85820 Personal history of malignant melanoma of skin: Secondary | ICD-10-CM | POA: Diagnosis not present

## 2022-12-20 DIAGNOSIS — M1711 Unilateral primary osteoarthritis, right knee: Secondary | ICD-10-CM | POA: Diagnosis not present

## 2022-12-22 DIAGNOSIS — M5416 Radiculopathy, lumbar region: Secondary | ICD-10-CM | POA: Diagnosis not present

## 2022-12-27 DIAGNOSIS — H2511 Age-related nuclear cataract, right eye: Secondary | ICD-10-CM | POA: Diagnosis not present

## 2022-12-27 DIAGNOSIS — H2513 Age-related nuclear cataract, bilateral: Secondary | ICD-10-CM | POA: Diagnosis not present

## 2023-01-18 DIAGNOSIS — M5416 Radiculopathy, lumbar region: Secondary | ICD-10-CM | POA: Diagnosis not present

## 2023-02-14 DIAGNOSIS — H2513 Age-related nuclear cataract, bilateral: Secondary | ICD-10-CM | POA: Diagnosis not present

## 2023-02-14 DIAGNOSIS — H401131 Primary open-angle glaucoma, bilateral, mild stage: Secondary | ICD-10-CM | POA: Diagnosis not present

## 2023-02-22 ENCOUNTER — Ambulatory Visit: Payer: Medicare Other | Admitting: Neurology

## 2023-02-23 ENCOUNTER — Ambulatory Visit
Admission: RE | Admit: 2023-02-23 | Discharge: 2023-02-23 | Disposition: A | Discharge status: Home / Self Care | Payer: Medicare Other | Source: Ambulatory Visit | Attending: Family Medicine | Admitting: Family Medicine

## 2023-02-23 DIAGNOSIS — M8589 Other specified disorders of bone density and structure, multiple sites: Secondary | ICD-10-CM | POA: Diagnosis not present

## 2023-02-23 DIAGNOSIS — M8580 Other specified disorders of bone density and structure, unspecified site: Secondary | ICD-10-CM

## 2023-03-17 DIAGNOSIS — H401111 Primary open-angle glaucoma, right eye, mild stage: Secondary | ICD-10-CM | POA: Diagnosis not present

## 2023-03-17 DIAGNOSIS — H2511 Age-related nuclear cataract, right eye: Secondary | ICD-10-CM | POA: Diagnosis not present

## 2023-03-18 DIAGNOSIS — H25042 Posterior subcapsular polar age-related cataract, left eye: Secondary | ICD-10-CM | POA: Diagnosis not present

## 2023-03-18 DIAGNOSIS — H25012 Cortical age-related cataract, left eye: Secondary | ICD-10-CM | POA: Diagnosis not present

## 2023-03-18 DIAGNOSIS — H2512 Age-related nuclear cataract, left eye: Secondary | ICD-10-CM | POA: Diagnosis not present

## 2023-03-23 DIAGNOSIS — H2511 Age-related nuclear cataract, right eye: Secondary | ICD-10-CM | POA: Diagnosis not present

## 2023-03-23 DIAGNOSIS — Z961 Presence of intraocular lens: Secondary | ICD-10-CM | POA: Diagnosis not present

## 2023-04-07 DIAGNOSIS — H25042 Posterior subcapsular polar age-related cataract, left eye: Secondary | ICD-10-CM | POA: Diagnosis not present

## 2023-04-07 DIAGNOSIS — H401121 Primary open-angle glaucoma, left eye, mild stage: Secondary | ICD-10-CM | POA: Diagnosis not present

## 2023-04-07 DIAGNOSIS — H2512 Age-related nuclear cataract, left eye: Secondary | ICD-10-CM | POA: Diagnosis not present

## 2023-04-07 DIAGNOSIS — H25012 Cortical age-related cataract, left eye: Secondary | ICD-10-CM | POA: Diagnosis not present

## 2023-04-08 NOTE — Progress Notes (Signed)
  Radiation Oncology         [phone]) [phone] ________________________________  Name: James Tate MRN: [id number]  Date: 10/06/2022  DOB: 1946/11/12  End of Treatment Note  Diagnosis:   77 y.o. gentleman with Stage T1c adenocarcinoma of the prostate with Gleason score of 4+4, and PSA of 13.2.      Indication for treatment:  Curative, Definitive Radiotherapy       Radiation treatment dates:   08/12/22 - 10/06/22  Site/dose:  1. The prostate, seminal vesicles, and pelvic lymph nodes were initially treated to 45 Gy in 25 fractions of 1.8 Gy  2. The prostate only was boosted to 75 Gy with 15 additional fractions of 2.0 Gy   Beams/energy:  1. The prostate, seminal vesicles, and pelvic lymph nodes were initially treated using VMAT intensity modulated radiotherapy delivering 6 megavolt photons. Image guidance was performed with CB-CT studies prior to each fraction. He was immobilized with a body fix lower extremity mold.  2. the prostate only was boosted using VMAT intensity modulated radiotherapy delivering 6 megavolt photons. Image guidance was performed with CB-CT studies prior to each fraction. He was immobilized with a body fix lower extremity mold.  Narrative: The patient tolerated radiation treatment relatively well.   The patient experienced some minor urinary irritation and modest fatigue.    Plan: The patient has completed radiation treatment. He will return to radiation oncology clinic for routine followup in one month. I advised him to call or return sooner if he has any questions or concerns related to his recovery or treatment. ________________________________  Artist Pais. Kathrynn Running, M.D.

## 2023-04-13 DIAGNOSIS — Z9849 Cataract extraction status, unspecified eye: Secondary | ICD-10-CM | POA: Diagnosis not present

## 2023-04-13 DIAGNOSIS — H2512 Age-related nuclear cataract, left eye: Secondary | ICD-10-CM | POA: Diagnosis not present

## 2023-04-13 DIAGNOSIS — Z961 Presence of intraocular lens: Secondary | ICD-10-CM | POA: Diagnosis not present

## 2023-04-27 DIAGNOSIS — C61 Malignant neoplasm of prostate: Secondary | ICD-10-CM | POA: Diagnosis not present

## 2023-04-28 DIAGNOSIS — Z0000 Encounter for general adult medical examination without abnormal findings: Secondary | ICD-10-CM | POA: Diagnosis not present

## 2023-04-28 DIAGNOSIS — G4733 Obstructive sleep apnea (adult) (pediatric): Secondary | ICD-10-CM | POA: Diagnosis not present

## 2023-04-28 DIAGNOSIS — I700 Atherosclerosis of aorta: Secondary | ICD-10-CM | POA: Diagnosis not present

## 2023-04-28 DIAGNOSIS — Z85820 Personal history of malignant melanoma of skin: Secondary | ICD-10-CM | POA: Diagnosis not present

## 2023-04-28 DIAGNOSIS — Z79899 Other long term (current) drug therapy: Secondary | ICD-10-CM | POA: Diagnosis not present

## 2023-04-28 DIAGNOSIS — Z8673 Personal history of transient ischemic attack (TIA), 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 residual deficits: Secondary | ICD-10-CM | POA: Diagnosis not present

## 2023-04-28 DIAGNOSIS — Z1331 Encounter for screening for depression: Secondary | ICD-10-CM | POA: Diagnosis not present

## 2023-04-28 DIAGNOSIS — R413 Other amnesia: Secondary | ICD-10-CM | POA: Diagnosis not present

## 2023-04-28 DIAGNOSIS — H409 Unspecified glaucoma: Secondary | ICD-10-CM | POA: Diagnosis not present

## 2023-04-28 DIAGNOSIS — C61 Malignant neoplasm of prostate: Secondary | ICD-10-CM | POA: Diagnosis not present

## 2023-04-28 DIAGNOSIS — M8580 Other specified disorders of bone density and structure, unspecified site: Secondary | ICD-10-CM | POA: Diagnosis not present

## 2023-04-28 DIAGNOSIS — E7800 Pure hypercholesterolemia, unspecified: Secondary | ICD-10-CM | POA: Diagnosis not present

## 2023-04-28 LAB — LAB REPORT - SCANNED: EGFR: 93

## 2023-05-06 DIAGNOSIS — N400 Enlarged prostate without lower urinary tract symptoms: Secondary | ICD-10-CM | POA: Diagnosis not present

## 2023-05-06 DIAGNOSIS — C61 Malignant neoplasm of prostate: Secondary | ICD-10-CM | POA: Diagnosis not present

## 2023-05-10 DIAGNOSIS — M1711 Unilateral primary osteoarthritis, right knee: Secondary | ICD-10-CM | POA: Diagnosis not present

## 2023-06-15 DIAGNOSIS — L814 Other melanin hyperpigmentation: Secondary | ICD-10-CM | POA: Diagnosis not present

## 2023-06-15 DIAGNOSIS — L821 Other seborrheic keratosis: Secondary | ICD-10-CM | POA: Diagnosis not present

## 2023-06-15 DIAGNOSIS — D225 Melanocytic nevi of trunk: Secondary | ICD-10-CM | POA: Diagnosis not present

## 2023-06-15 DIAGNOSIS — Z08 Encounter for follow-up examination after completed treatment for malignant neoplasm: Secondary | ICD-10-CM | POA: Diagnosis not present

## 2023-06-15 DIAGNOSIS — Z85820 Personal history of malignant melanoma of skin: Secondary | ICD-10-CM | POA: Diagnosis not present

## 2023-06-20 DIAGNOSIS — D485 Neoplasm of uncertain behavior of skin: Secondary | ICD-10-CM | POA: Diagnosis not present

## 2023-06-20 DIAGNOSIS — L821 Other seborrheic keratosis: Secondary | ICD-10-CM | POA: Diagnosis not present

## 2023-06-20 DIAGNOSIS — M542 Cervicalgia: Secondary | ICD-10-CM | POA: Diagnosis not present

## 2023-07-15 DIAGNOSIS — H401131 Primary open-angle glaucoma, bilateral, mild stage: Secondary | ICD-10-CM | POA: Diagnosis not present

## 2023-07-25 DIAGNOSIS — M542 Cervicalgia: Secondary | ICD-10-CM | POA: Diagnosis not present

## 2023-07-25 DIAGNOSIS — M1711 Unilateral primary osteoarthritis, right knee: Secondary | ICD-10-CM | POA: Diagnosis not present

## 2023-08-01 DIAGNOSIS — M47812 Spondylosis without myelopathy or radiculopathy, cervical region: Secondary | ICD-10-CM | POA: Diagnosis not present

## 2023-08-03 DIAGNOSIS — M47812 Spondylosis without myelopathy or radiculopathy, cervical region: Secondary | ICD-10-CM | POA: Diagnosis not present

## 2023-08-09 DIAGNOSIS — M47812 Spondylosis without myelopathy or radiculopathy, cervical region: Secondary | ICD-10-CM | POA: Diagnosis not present

## 2023-08-11 DIAGNOSIS — M47812 Spondylosis without myelopathy or radiculopathy, cervical region: Secondary | ICD-10-CM | POA: Diagnosis not present

## 2023-08-16 DIAGNOSIS — M47812 Spondylosis without myelopathy or radiculopathy, cervical region: Secondary | ICD-10-CM | POA: Diagnosis not present

## 2023-08-18 DIAGNOSIS — M47812 Spondylosis without myelopathy or radiculopathy, cervical region: Secondary | ICD-10-CM | POA: Diagnosis not present

## 2023-08-23 DIAGNOSIS — M47812 Spondylosis without myelopathy or radiculopathy, cervical region: Secondary | ICD-10-CM | POA: Diagnosis not present

## 2023-08-25 DIAGNOSIS — M47812 Spondylosis without myelopathy or radiculopathy, cervical region: Secondary | ICD-10-CM | POA: Diagnosis not present

## 2023-08-29 ENCOUNTER — Ambulatory Visit (INDEPENDENT_AMBULATORY_CARE_PROVIDER_SITE_OTHER): Payer: Medicare Other | Admitting: Neurology

## 2023-08-29 ENCOUNTER — Encounter: Payer: Self-pay | Admitting: Neurology

## 2023-08-29 VITALS — BP 120/68 | HR 98 | Resp 18 | Wt 192.0 lb

## 2023-08-29 DIAGNOSIS — R413 Other amnesia: Secondary | ICD-10-CM

## 2023-08-29 NOTE — Patient Instructions (Signed)
Good to see you.  Schedule Neurocognitive testing  2. Follow-up in 6 months, call for any changes  You have been referred for a neurocognitive evaluation in our office.   The evaluation has two parts.   The first part of the evaluation is a clinical interview with the neuropsychologist (Dr. Milbert Coulter or Dr. Roseanne Reno). Please bring someone with you to this appointment if possible, as it is helpful for the doctor to hear from both you and another adult who knows you well.   The second part of the evaluation is testing with the doctor's technician Annabelle Harman or Selena Batten). The testing includes a variety of tasks- mostly question-and-answer, some paper-and-pencil. There is nothing you need to do to prepare for this appointment, but having a good night's sleep prior to the testing, taking medications as you normally would, and bringing eyeglasses and hearing aids (if you wear them), is advised. Please make sure that you wear a mask to the appointment.  Please note: We have to reserve several hours of the neuropsychologist's time and the psychometrician's time for your evaluation appointment. As such, please note that there is a No-Show fee of $100. If you are unable to attend any of your appointments, please contact our office as soon as possible to reschedule.    RECOMMENDATIONS FOR ALL PATIENTS WITH MEMORY PROBLEMS: 1. Continue to exercise (Recommend 30 minutes of walking everyday, or 3 hours every week) 2. Increase social interactions - continue going to [place] and enjoy social gatherings with friends and family 3. Eat healthy, avoid fried foods and eat more fruits and vegetables 4. Maintain adequate blood pressure, blood sugar, and blood cholesterol level. Reducing the risk of stroke and cardiovascular disease also helps promoting better memory. 5. Avoid stressful situations. Live a simple life and avoid aggravations. Organize your time and prepare for the next day in anticipation. 6. Sleep well, avoid any  interruptions of sleep and avoid any distractions in the bedroom that may interfere with adequate sleep quality 7. Avoid sugar, avoid sweets as there is a strong link between excessive sugar intake, diabetes, and cognitive impairment We discussed the Mediterranean diet, which has been shown to help patients reduce the risk of progressive memory disorders and reduces cardiovascular risk. This includes eating fish, eat fruits and green leafy vegetables, nuts like almonds and hazelnuts, walnuts, and also use olive oil. Avoid fast foods and fried foods as much as possible. Avoid sweets and sugar as sugar use has been linked to worsening of memory function.

## 2023-08-29 NOTE — Progress Notes (Signed)
NEUROLOGY FOLLOW UP OFFICE NOTE  [name] [id number] [birth year]  HISTORY OF PRESENT ILLNESS: I had the pleasure of seeing James Tate in follow-up in the neurology clinic on 08/29/2023 for memory loss.  The patient was last seen a year ago by Memory Disorders PA [name]. He is again accompanied by his wife who helps supplement the history today.  Records and images were personally reviewed where available.  MRI brain with and without contrast 08/2021 no acute changes, there was mild diffuse atrophy, mild chronic microvascular disease, small chronic infarcts within the bilateral cerebellar hemispheres. There was note of a 6mm round T2 hyperintense focus within the right parotid gland which has been evaluated by ENT.   Since his last visit, he thinks his memory is okay. His wife notes that there has been some decline, he would not recall a conversation they had earlier that day. When their children call, she has to talk to them first, he would not recall what they did the past week. His wife has noticed he forgets to brush his teeth, she has to put toothpaste on his toothbrush to help him remember. They have an irrigation system at home and he would not recall if he turned it off or on. He continues to drive and denies getting lost. He manages his own medications and has reminders on his phone, no missed medications. No missed bill payments. He has not left the stove on. He is independent with dressing and bathing. He gets 6.5-7 hours of sleep with his CPAP. Mood is stable, no paranoia or hallucinations.  He denies any headaches, focal numbness/tingling/weakness. He gets dizzy in the shower when bending his head down or getting up from a chair. No falls.    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 His wife James Tate is present to provide additional history. He feels his memory is okay, thinks it could be better. Sometimes he cannot remember things from year ago, or yesterday. He and his wife started noticing changes after his stroke in 2016. He was at [facility] at that time, records unavailable for review, he had left-sided weakness that resolved. James Tate notes that he was having a little memory change prior to the stroke such as inability to recall much of his childhood or their dating/marriage, but it became more pronounced that he would forget what she had said. He has always "not a conversation initiator," but he would not recall parts of their decision-making conversations or that he had seen a TV show 3 days prior. He denies getting lost driving, his wife denies any driving concerns. He denies forgetting medications regularly, occasionally he may forget his aspirin. He denies missing bill payments, James Tate notes he is very structured. He denies leaving the stove on or misplacing things regularly (except his glasses sometimes). He states his mood is pretty good. James Tate notes he is very calm and "low-key," a little more pronounced now that sometimes she wonders if he is a little depressed. No paranoia or hallucinations. Sleep is overall good with his CPAP, he has rare times that he would "bop" her in his sleep, 1-2 times a year. James Tate has noticed he has tremors when writing, but none when drinking from a cup or using utensils. He denies any headaches, dizziness, diplopia, dysarthria, dysphagia, neck/back  pain, focal numbness/tingling/weakness, bowel/bladder dysfunction, anosmia, no falls. He is a retired Careers adviser. His older sister has dementia, his mother had memory issues. He denies any significant head injuries. He drinks alcohol occasionally. He is pretty active and plays golf.  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   Flat feet    Glaucoma    left eye   History of colon polyps    History of hyperparathyroidism    Melanoma of back (HCC)    MR (mitral regurgitation) 05/16/2018   Moderate noted on ECHO   MVP (mitral valve prolapse) 05/16/2018   Noted on ECHO   OSA on CPAP    cpap set on 4   Osteopenia    resolved with medical therapy (calcium and fosamax)   PFO (patent foramen ovale)    Sensorineural hearing loss (SNHL) of both ears     MEDICATIONS: Current Outpatient Medications on File Prior to Visit  Medication Sig Dispense Refill   acetaminophen (TYLENOL) 325 MG tablet 1 tablet as needed     aspirin EC 81 MG tablet Take 81 mg by mouth daily.     Cholecalciferol (D2000 ULTRA STRENGTH) 50 MCG (2000 UT) CAPS Take 2,000 Units by mouth daily.     Cyanocobalamin (VITAMIN B-12 PO) Take 1 tablet by mouth every morning.     diphenhydramine-acetaminophen (TYLENOL PM) 25-500 MG TABS tablet Take 1 tablet by mouth at bedtime.     econazole nitrate 1 % cream as needed.     Evolocumab (REPATHA SURECLICK) 140 MG/ML SOAJ Inject 140 mg into the skin every 14 (fourteen) days. 2 mL 11   Miconazole Nitrate 2 % OINT Apply 1 application topically daily as needed.     Multiple Minerals-Vitamins (CAL-MAG-ZINC-D PO) Take 1 tablet by mouth daily.     Multiple Vitamin (MULTIVITAMIN) capsule Take 1 capsule by mouth daily.     Omega-3 Fatty Acids (OMEGA 3 PO) Take 1 tablet by mouth every morning.     PRESCRIPTION MEDICATION Lanaprost 0.05 % eye drop both eyes at qhs     [facility] injection 2nd dose 12/22     tadalafil (CIALIS) 20 MG tablet Take 20 mg by mouth daily as needed for erectile dysfunction.     triamcinolone (KENALOG) 0.1 % paste Use as directed 1 application in the mouth or throat 2 (two) times daily as needed (mouth sores).     Vitamin K, Phytonadione, 100 MCG TABS Take 100 mcg by mouth daily.      No current facility-administered  medications on file prior to visit.    ALLERGIES: Allergies  Allergen Reactions   Atorvastatin Other (See Comments)    Aches in legs   Codeine Other (See Comments)    Headache.    Erythromycin     Childhood allergy - unknown   Other     Other reaction(s): Headache   Rosuvastatin Calcium     Other reaction(s): ? Memory problem (03/2020) Aches in   legs    FAMILY HISTORY: Family History  Problem Relation Age of Onset   Heart Problems Mother        valve replacement & pacemaker   Osteoporosis Father    Other Maternal Grandfather        poss MI, per pt    SOCIAL HISTORY: Social History   Socioeconomic History   Marital status: Married  Spouse name: Not on file   Number of children: Not on file   Years of education: Not on file   Highest education level: Not on file  Occupational History   Not on file  Tobacco Use   Smoking status: Former    Current packs/day: 0.00    Average packs/day: 1 pack/day for 6.0 years (6.0 ttl pk-yrs)    Types: Cigarettes    Start date: 09/26/1965    Quit date: 09/27/1971    Years since quitting: 51.9   Smokeless tobacco: Never  Vaping Use   Vaping status: Never Used  Substance and Sexual Activity   Alcohol use: Yes    Alcohol/week: 2.0 standard drinks of alcohol    Types: 2 Glasses of wine per week    Comment: 1 or  2 glasses per week   Drug use: No   Sexual activity: Not on file  Other Topics Concern   Not on file  Social History Narrative   Pt lives in [place] with spouse.  Retired Catering manager for [employer].  Right handed    Social Determinants of Health   Financial Resource Strain: Not on file  Food Insecurity: Not on file  Transportation Needs: Not on file  Physical Activity: Not on file  Stress: Not on file  Social Connections: Not on file  Intimate Partner Violence: Not on file     PHYSICAL EXAM: Vitals:   08/29/23 1039  BP: 120/68  Pulse: 98  Resp: 18  SpO2: 100%   General: No acute  distress Head:  Normocephalic/atraumatic Skin/Extremities: No rash, no edema Neurological Exam: alert and oriented to person, place, and time. No aphasia or dysarthria. Fund of knowledge is appropriate.  Recent and remote memory are intact.  Attention and concentration are normal.  MoCA 28/30    08/29/2023   10:00 AM 08/05/2021   10:00 AM  Montreal Cognitive Assessment   Visuospatial/ Executive (0/5) 5 5  Naming (0/3) 3 3  Attention: Read list of digits (0/2) 2 2  Attention: Read list of letters (0/1) 1 1  Attention: Serial 7 subtraction starting at 100 (0/3) 3 3  Language: Repeat phrase (0/2) 2 2  Language : Fluency (0/1) 1 1  Abstraction (0/2) 2 1  Delayed Recall (0/5) 3 4  Orientation (0/6) 6 6  Total 28 28  Adjusted Score (based on education)  28   Cranial nerves: Pupils equal, round. Extraocular movements intact with no nystagmus. Visual fields full.  No facial asymmetry.  Motor: Bulk and tone normal, muscle strength 5/5 throughout with no pronator drift.   Finger to nose testing intact.  Gait narrow-based and steady, mild difficulty with tandem walk. Romberg negative. Very mild bilateral postural tremor.   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 MRI brain showed mild diffuse atrophy and chronic microvascular disease. His wife reports some worsening of short-term memory. MoCA today 28/30, they deny any difficulties with complex tasks. Neurocognitive testing will be ordered to further evaluate symptoms. No clear indication to start cholinesterase inhibitors at this time. We discussed the importance of control of vascular risk factors, physical and brain stimulation exercises, MIND diet for overall brain health. Follow-up in 6 months, call for any changes.     Thank you for allowing me to participate in his care.  Please do not  hesitate to call  for any questions or concerns.    Patrcia Dolly, M.D.   CC: Dr. Manus Gunning

## 2023-08-30 DIAGNOSIS — M47812 Spondylosis without myelopathy or radiculopathy, cervical region: Secondary | ICD-10-CM | POA: Diagnosis not present

## 2023-08-30 DIAGNOSIS — Z23 Encounter for immunization: Secondary | ICD-10-CM | POA: Diagnosis not present

## 2023-09-02 ENCOUNTER — Ambulatory Visit: Payer: Medicare Other | Admitting: Neurology

## 2023-09-05 IMAGING — PT NM PET TUM IMG SKULL BASE T - THIGH
1 of 7 series · 1 of 25 positions shown · non-contrast
Comparison: Prostate MRI 11/18/2021

CLINICAL DATA: Prostate carcinoma with biochemical recurrence.

EXAM:
NUCLEAR MEDICINE PET SKULL BASE TO THIGH
TECHNIQUE: 8.9 mCi F18 Piflufolastat (Pylarify) was injected intravenously.
Full-ring PET imaging was performed from the skull base to thigh
after the radiotracer. CT data was obtained and used for attenuation
correction and anatomic localization.

[Series 4: ct sk_thigh 5.0 br38 · axial · 5.0mm · 0.98mm/px · 1 of 260 slices shown]
[im 208/260  brain]
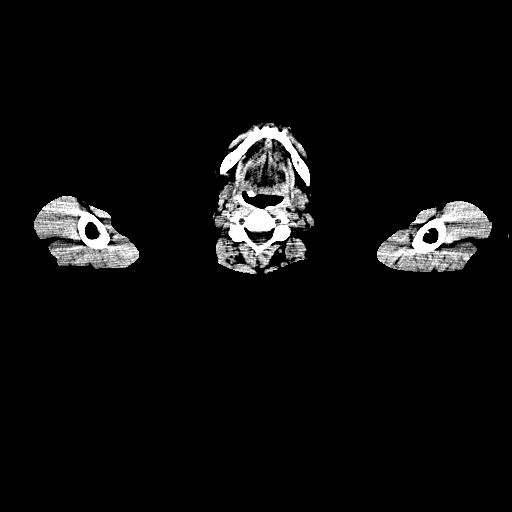

[1 of 25 positions shown; findings below may reference images not displayed]

FINDINGS: NECK

No radiotracer activity in neck lymph nodes.

Incidental CT finding: None

CHEST

No radiotracer accumulation within mediastinal or hilar lymph nodes.
No suspicious pulmonary nodules on the CT scan.

Incidental CT finding: Coronary artery calcification and aortic
atherosclerotic calcification.

ABDOMEN/PELVIS

Prostate: There are several foci of discrete radiotracer activity
within the prostate gland. For example midline posterior peripheral
zone with SUV max equal 7.2. Anterior central lesion on the LEFT
with SUV max equal 5.7.

Lymph nodes: No abnormal radiotracer accumulation within pelvic or
abdominal nodes.

Liver: No evidence of liver metastasis

Incidental CT finding: Postcholecystectomy. Atherosclerotic
calcification of the aorta. Enlarged prostate measures 7.2 cm in
diameter.

SKELETON

No focal  activity to suggest skeletal metastasis.
IMPRESSION: 1. Several foci of radiotracer activity within the prostate gland
are suspicious for adenocarcinoma.
2. No evidence metastatic adenopathy in the pelvis or periaortic
retroperitoneum.
3. No evidence of visceral metastasis or skeletal metastasis.

## 2023-09-07 DIAGNOSIS — G4733 Obstructive sleep apnea (adult) (pediatric): Secondary | ICD-10-CM | POA: Diagnosis not present

## 2023-10-21 ENCOUNTER — Encounter: Payer: Self-pay | Admitting: Neurology

## 2023-10-31 DIAGNOSIS — C61 Malignant neoplasm of prostate: Secondary | ICD-10-CM | POA: Diagnosis not present

## 2023-10-31 NOTE — Progress Notes (Signed)
Date:  11/14/2023   ID:  James Tate, DOB 04-Jan-1946, MRN [id number]  PCP:  Blair Heys, MD (Inactive)  Cardiologist:   Eden Emms Electrophysiologist:  None   Evaluation Performed:  Follow-Up Visit  Chief Complaint:  TIA/MR  History of Present Illness:    77 y.o. . CVA Rx [place] April 2016. Acute right facial numbness with slurred speech. MRI, carotids and telemetry unrevealing Rx initially with ASA and statin Full recovery Subsequent TEE cor triatriatum and PFO  Seen by Dr Excell Seltzer who felt no closure indicated at that time unless recurrence ILR placed by Dr Johney Frame 07/02/15. 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 Memory issues with crestor Now on Praluent /Repatha since April 2022  [facility] neurology for memory issues Has cognitive testing ordered. 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   Flat feet    Glaucoma    left eye   History of colon polyps    History of hyperparathyroidism    Melanoma of back (HCC)    MR (mitral regurgitation) 05/16/2018   Moderate noted on ECHO   MVP (mitral valve prolapse) 05/16/2018   Noted on ECHO   OSA on CPAP    cpap set on 4   Osteopenia     resolved with medical therapy (calcium and fosamax)   PFO (patent foramen ovale)    Sensorineural hearing loss (SNHL) of both ears    Past Surgical History:  Procedure Laterality Date   APPLICATION OF A-CELL OF BACK N/A 12/19/2014   Procedure: APPLICATION OF A-CELL OF BACK;  Surgeon: Wayland Denis, DO;  Location:  SURGERY CENTER;  Service: Plastics;  Laterality: N/A;   CHOLECYSTECTOMY  1996   COLONOSCOPY     EP IMPLANTABLE DEVICE N/A 07/02/2015   Procedure: Loop Recorder Insertion;  Surgeon: Hillis Range, MD;  Location: [facility];  Service: Cardiovascular;  Laterality: N/A;   GOLD SEED IMPLANT N/A 07/29/2022   Procedure: GOLD SEED IMPLANT;  Surgeon: Heloise Purpura, MD;  Location: [facility];  Service: Urology;  Laterality: N/A;  ONLY NEEDS 30 MIN   implantable loop recorder removal  12/13/2018   MDT LINQ removed in office by Dr Johney Frame   LASIK  2003   MELANOMA EXCISION N/A 10/04/2014   Procedure: WIDE LOCAL EXCISION OF UPPER BACK MELANOMA ;  Surgeon: Atilano Ina, MD;  Location: [facility];  Service: General;  Laterality: N/A;   PARATHYROIDECTOMY  2004   SPACE OAR INSTILLATION N/A 07/29/2022   Procedure: SPACE OAR INSTILLATION;  Surgeon: Heloise Purpura, MD;  Location: [facility];  Service: Urology;  Laterality: N/A;   TEE WITHOUT CARDIOVERSION N/A 06/12/2015   Procedure: TRANSESOPHAGEAL ECHOCARDIOGRAM (TEE);  Surgeon: Wendall Stade, MD;  Location:  [facility] ENDOSCOPY;  Service: Cardiovascular;  Laterality: N/A;   TONSILLECTOMY     as child   TOTAL KNEE ARTHROPLASTY Left 12/22/2018   Procedure: TOTAL KNEE ARTHROPLASTY;  Surgeon: Jodi Geralds, MD;  Location: [facility];  Service: Orthopedics;  Laterality: Left;     Current Meds  Medication Sig   acetaminophen (TYLENOL) 325 MG tablet 1 tablet as needed   aspirin EC 81 MG tablet Take 81 mg by mouth daily.   buPROPion ER (WELLBUTRIN SR) 100 MG 12 hr tablet Take 100 mg by mouth at bedtime.    Cholecalciferol (D2000 ULTRA STRENGTH) 50 MCG (2000 UT) CAPS Take 2,000 Units by mouth daily.   Cyanocobalamin (VITAMIN B-12 PO) Take 1 tablet by mouth every morning.   diphenhydramine-acetaminophen (TYLENOL PM) 25-500 MG TABS tablet Take 1 tablet by mouth at bedtime.   econazole nitrate 1 % cream as needed.   Evolocumab (REPATHA SURECLICK) 140 MG/ML SOAJ Inject 140 mg into the skin every 14 (fourteen) days.   Miconazole Nitrate 2 % OINT Apply 1 application topically daily as needed.   Multiple Minerals-Vitamins (CAL-MAG-ZINC-D PO) Take 1 tablet by mouth daily.   Multiple Vitamin (MULTIVITAMIN) capsule Take 1 capsule by mouth daily.   Omega-3 Fatty Acids (OMEGA 3 PO) Take 1 tablet by mouth every morning.   PRESCRIPTION MEDICATION Lanaprost 0.05 % eye drop both eyes at qhs   [facility] injection 2nd dose 12/22   tadalafil (CIALIS) 20 MG tablet Take 20 mg by mouth daily as needed for erectile dysfunction.   triamcinolone (KENALOG) 0.1 % paste Use as directed 1 application in the mouth or throat 2 (two) times daily as needed (mouth sores).   Vitamin K, Phytonadione, 100 MCG TABS Take 100 mcg by mouth daily.      Allergies:   Atorvastatin, Codeine, Erythromycin, Other, and Rosuvastatin calcium   Social History   Tobacco Use   Smoking status: Former    Current packs/day: 0.00    Average packs/day: 1 pack/day for 6.0 years (6.0 ttl pk-yrs)    Types: Cigarettes    Start date: 09/26/1965    Quit date: 09/27/1971    Years since quitting: 52.1   Smokeless tobacco: Never  Vaping Use   Vaping status: Never Used  Substance Use Topics   Alcohol use: Yes    Alcohol/week: 2.0 standard drinks of alcohol    Types: 2 Glasses of wine per week    Comment: 1 or  2 glasses per week   Drug use: No     Family Hx: The patient's family history includes Heart Problems in his mother; Osteoporosis in his father; Other in his maternal grandfather.  ROS:   Please see the history of present illness.      All other systems reviewed and are negative.   Prior CV studies:   The following studies were reviewed today:  Echo June 2019   Labs/Other Tests and Data Reviewed:    EKG:  11/14/2023 SR non specific ST changes   Recent Labs: No results found for requested labs within last 365 days.   Recent Lipid Panel Lab Results  Component Value Date/Time   CHOL 151 06/17/2021 10:59 AM   TRIG 174 (H) 06/17/2021 10:59 AM   HDL 55 06/17/2021 10:59 AM   CHOLHDL 2.7 06/17/2021 10:59 AM   CHOLHDL 2.3 05/24/2016  10:32 AM   LDLCALC 67 06/17/2021 10:59 AM    Wt Readings from Last 3 Encounters:  11/14/23 191 lb (86.6 kg)  08/29/23 192 lb (87.1 kg)  07/29/22 188 lb 8 oz (85.5 kg)     Objective:    Vital Signs:  BP 136/64   Pulse 74   Ht 6' (1.829 m)   Wt 191 lb (86.6 kg)   SpO2 99%   BMI 25.90 kg/m    Affect appropriate Healthy:  appears stated age HEENT: normal Neck supple with no adenopathy JVP normal no bruits no thyromegaly Lungs clear with no wheezing and good diaphragmatic motion Heart:  S1/S2 MR murmur, no rub, gallop or click PMI normal Abdomen: benighn, BS positve, no tenderness, no AAA no bruit.  No HSM or HJR Distal pulses intact with no bruits No edema Neuro non-focal Skin warm and dry Post left TKR    ASSESSMENT & PLAN:    1. CVA:  ASA no cause found PFO no closure per Dr Excell Seltzer ILR explanted no PAF 2. PFO:  See above echo 08/02/19 no PFO by color flow no bubble study done 3. MR:  With posterior leaflet prolapse moderate TTE June 2019 f/u echo 08/02/19 only mild MR  4. Prostate: f/u Dr Marlou Porch Cancer post surgery and XRT   5. HLD:  Intolerant to statins with neuropathy and memory issues now on Repatha labs with primary LDL 53 May 2024  On labs done at Dr Ova Freshwater' office 04/01/21 6. Ortho:  F/u Dr Luiz Blare post left TKR January 2020    COVID-19 Education: The signs and symptoms of COVID-19 were discussed with the patient and how to seek care for testing  (follow up with PCP or arrange E-visit).  The importance of social distancing was discussed today.  Time:   Today, I have spent 30 minutes with the patient with telehealth technology discussing the above problems.     Medication Adjustments/Labs and Tests Ordered: Current medicines are reviewed at length with the patient today.  Concerns regarding medicines are outlined above.   Tests Ordered:   Medication Changes:  None   Disposition:  Follow up in a year   Signed, Charlton Haws, MD  11/14/2023 9:47 AM    [facility]

## 2023-11-07 DIAGNOSIS — C61 Malignant neoplasm of prostate: Secondary | ICD-10-CM | POA: Diagnosis not present

## 2023-11-10 DIAGNOSIS — M1711 Unilateral primary osteoarthritis, right knee: Secondary | ICD-10-CM | POA: Diagnosis not present

## 2023-11-14 ENCOUNTER — Ambulatory Visit: Payer: Medicare Other | Attending: Cardiovascular Disease | Admitting: Cardiovascular Disease

## 2023-11-14 ENCOUNTER — Encounter: Payer: Self-pay | Admitting: Cardiovascular Disease

## 2023-11-14 ENCOUNTER — Other Ambulatory Visit: Payer: Self-pay

## 2023-11-14 VITALS — BP 136/64 | HR 74 | Ht 72.0 in | Wt 191.0 lb

## 2023-11-14 DIAGNOSIS — I639 Cerebral infarction, unspecified: Secondary | ICD-10-CM | POA: Diagnosis not present

## 2023-11-14 DIAGNOSIS — I341 Nonrheumatic mitral (valve) prolapse: Secondary | ICD-10-CM | POA: Insufficient documentation

## 2023-11-14 DIAGNOSIS — E7800 Pure hypercholesterolemia, unspecified: Secondary | ICD-10-CM | POA: Insufficient documentation

## 2023-11-14 NOTE — Patient Instructions (Addendum)
Medication Instructions:  Your physician recommends that you continue on your current medications as directed. Please refer to the Current Medication list given to you today.  *If you need a refill on your cardiac medications before your next appointment, please call your pharmacy*  Lab Work: If you have labs (blood work) drawn today and your tests are completely normal, you will receive your results only by: MyChart Message (if you have MyChart) OR A paper copy in the mail If you have any lab test that is abnormal or we need to change your treatment, we will call you to review the results.  Testing/Procedures: None ordered today  Follow-Up: At [facility], you and your health needs are our priority.  As part of our continuing mission to provide you with exceptional heart care, we have created designated Provider Care Teams.  These Care Teams include your primary Cardiologist (physician) and Advanced Practice Providers (APPs -  Physician Assistants and Nurse Practitioners) who all work together to provide you with the care you need, when you need it.  We recommend signing up for the patient portal called "MyChart".  Sign up information is provided on this After Visit Summary.  MyChart is used to connect with patients for Virtual Visits (Telemedicine).  Patients are able to view lab/test results, encounter notes, upcoming appointments, etc.  Non-urgent messages can be sent to your provider as well.   To learn more about what you can do with MyChart, go to [email or web address].    Your next appointment:   1 year(s)  Provider:   Charlton Haws, MD     You have been referred to Lipid Clinic next available.

## 2023-11-25 ENCOUNTER — Other Ambulatory Visit: Payer: Self-pay | Admitting: Cardiovascular Disease

## 2023-11-25 ENCOUNTER — Telehealth: Payer: Self-pay | Admitting: Cardiovascular Disease

## 2023-11-25 NOTE — Telephone Encounter (Signed)
   Pre-operative Risk Assessment    Patient Name: James Tate  DOB: May 09, 1946 MRN: [id number]      Request for Surgical Clearance    Procedure:   Rt total Knee Replacement  Date of Surgery:  Clearance TBD                                 Surgeon:  Dr Jodi Geralds  Surgeon's Group or Practice Name:  [facility] Phone number:  [phone] Fax number:  [phone]   Type of Clearance Requested:   - Medical  - Pharmacy:  Hold ?  Unsure if he needs to hold medication   Type of Anesthesia:  Spinal   Additional requests/questions:    Nilda Riggs   11/25/2023, 10:17 AM

## 2023-11-25 NOTE — Telephone Encounter (Signed)
   Patient Name: James Tate  DOB: [birth year] MRN: [id number]  Primary Cardiologist: Charlton Haws, MD Procedure: Right total knee replacement   Chart reviewed as part of pre-operative protocol coverage. Given past medical history and time since last visit, based on ACC/AHA guidelines, James Tate is at acceptable risk for the planned procedure without further cardiovascular testing. Patient last seen by Dr. Eden Emms on 11/14/23, per Dr. Eden Emms he can proceed with surgery. He may hold aspirin for 7 days prior to procedure, please resume when safe to do so from a bleeding standpoint.   I will route this recommendation to the requesting party via Epic fax function and remove from pre-op pool.  Please call with questions.  Rip Harbour, NP 11/25/2023, 11:21 AM

## 2023-12-05 DIAGNOSIS — M1711 Unilateral primary osteoarthritis, right knee: Secondary | ICD-10-CM | POA: Diagnosis not present

## 2023-12-05 DIAGNOSIS — Z01818 Encounter for other preprocedural examination: Secondary | ICD-10-CM | POA: Diagnosis not present

## 2023-12-20 DIAGNOSIS — L538 Other specified erythematous conditions: Secondary | ICD-10-CM | POA: Diagnosis not present

## 2023-12-20 DIAGNOSIS — L814 Other melanin hyperpigmentation: Secondary | ICD-10-CM | POA: Diagnosis not present

## 2023-12-20 DIAGNOSIS — L821 Other seborrheic keratosis: Secondary | ICD-10-CM | POA: Diagnosis not present

## 2023-12-20 DIAGNOSIS — D492 Neoplasm of unspecified behavior of bone, soft tissue, and skin: Secondary | ICD-10-CM | POA: Diagnosis not present

## 2023-12-20 DIAGNOSIS — Z85820 Personal history of malignant melanoma of skin: Secondary | ICD-10-CM | POA: Diagnosis not present

## 2023-12-20 DIAGNOSIS — D225 Melanocytic nevi of trunk: Secondary | ICD-10-CM | POA: Diagnosis not present

## 2023-12-20 DIAGNOSIS — Z08 Encounter for follow-up examination after completed treatment for malignant neoplasm: Secondary | ICD-10-CM | POA: Diagnosis not present

## 2023-12-20 DIAGNOSIS — L820 Inflamed seborrheic keratosis: Secondary | ICD-10-CM | POA: Diagnosis not present

## 2023-12-21 ENCOUNTER — Ambulatory Visit: Payer: Medicare Other | Attending: Cardiology | Admitting: Pharmacist

## 2023-12-21 DIAGNOSIS — I639 Cerebral infarction, unspecified: Secondary | ICD-10-CM | POA: Diagnosis not present

## 2023-12-21 DIAGNOSIS — E7800 Pure hypercholesterolemia, unspecified: Secondary | ICD-10-CM | POA: Diagnosis not present

## 2023-12-21 NOTE — Progress Notes (Signed)
 Patient ID: [name]                 DOB: 01-14-1946                    MRN: [id number]      HPI: James Tate is a 78 y.o. male patient referred to lipid clinic by Dr. Stann Tate. PMH is significant for CVA (no cause found, no A-fib on loop, PFO but no closure per Dr. Arlester Tate), prostate cancer, OSA, BPH.   Had myalgias with lipitor and ? Memory issues with crestor . Has been on Praluent  /Repatha  since April 2022.  LDL-C in May was 42.  Patient presents to lipid clinic today.  Wife is on Leqvio  and patient interested in learning more about this.   Current Medications: Repatha  140mg  q 14 days Intolerances: myalgias with lipitor and ? Memory issues with crestor . 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   Flat feet    Glaucoma    left eye   History of colon polyps    History of hyperparathyroidism    Melanoma of back (HCC)    MR (mitral regurgitation) 05/16/2018   Moderate noted on ECHO   MVP (mitral valve prolapse) 05/16/2018   Noted on ECHO   OSA on CPAP    cpap set on 4   Osteopenia    resolved with medical therapy (calcium  and fosamax)   PFO (patent foramen ovale)    Sensorineural hearing loss (SNHL) of both ears     Current Outpatient Medications on File Prior to Visit  Medication Sig Dispense Refill   acetaminophen  (TYLENOL ) 325 MG tablet 1 tablet as needed     aspirin  EC 81 MG tablet Take 81 mg by mouth daily.     buPROPion  ER (WELLBUTRIN  SR) 100 MG 12 hr tablet Take 100 mg by mouth at bedtime.     Cholecalciferol  (D2000  ULTRA STRENGTH) 50 MCG (2000 UT) CAPS Take 2,000 Units by mouth daily.     Cyanocobalamin  (VITAMIN B-12 PO) Take 1 tablet by mouth every morning.     diphenhydramine -acetaminophen  (TYLENOL  PM) 25-500 MG TABS tablet Take 1 tablet by mouth at bedtime.     econazole nitrate 1 % cream as needed.     Miconazole Nitrate 2 % OINT Apply 1 application topically daily as needed.     Multiple Minerals-Vitamins (CAL-MAG-ZINC -Tate PO) Take 1 tablet by mouth daily.     Multiple Vitamin (MULTIVITAMIN) capsule Take 1 capsule by mouth daily.     Omega-3 Fatty Acids (OMEGA 3 PO) Take 1 tablet by mouth every morning.     PRESCRIPTION MEDICATION Lanaprost 0.05 % eye drop both eyes at qhs     REPATHA  SURECLICK 140 MG/ML SOAJ ADMINISTER 1 ML UNDER THE SKIN EVERY 14 DAYS 2 mL 11   SHINGRIX injection 2nd dose 12/22     tadalafil (CIALIS) 20 MG tablet Take 20 mg by mouth daily as  needed for erectile dysfunction.     triamcinolone (KENALOG) 0.1 % paste Use as directed 1 application in the mouth or throat 2 (two) times daily as needed (mouth sores).     Vitamin K , Phytonadione , 100 MCG TABS Take 100 mcg by mouth daily.      No current facility-administered medications on file prior to visit.    Allergies  Allergen Reactions   Atorvastatin  Other (See Comments)    Aches in legs   Codeine Other (See Comments)    Headache.    Erythromycin     Childhood allergy - unknown   Other     Other reaction(s): Headache   Rosuvastatin  Calcium      Other reaction(s): ? Memory problem (03/2020) Aches in   legs    Assessment/Plan:  1. Hyperlipidemia -  Hypercholesterolemia Assessment: LDL-C is well-controlled on Repatha  Patient interested in Leqvio  due to dosing frequency, his wife was started on Leqvio  by her PCP Discussed with patient that we do not have any cardiovascular risk reduction data with Leqvio  yet. From a cost perspective Leqvio  would be more advantageous for patient as he does have a Medicare supplement which  should cover the remaining 20% Reviewed side effects and dosing of Leqvio  Patient signed Leqvio  start form  Plan: After discussion patient would like to switch from Repatha  to Leqvio  Will submit start form to service center and referral to [facility] He is due for Repatha  today and I asked him to give himself an injection.  He does have a total of 4 more injections at home, I advised that it is up to him as to whether he wants to finish out his supply of Repatha .  He should make his infusion center appointment for 2 weeks after his last Repatha  injection.    Thank you,  James Tate James Tate, Pharm.Tate, BCACP, CPP [facility] of [facility] [address], [place], [place] [address]  Phone: [phone]; Fax: [phone]

## 2023-12-21 NOTE — Assessment & Plan Note (Addendum)
 Assessment: LDL-C is well-controlled on Repatha  Patient interested in Leqvio  due to dosing frequency, his wife was started on Leqvio  by her PCP Discussed with patient that we do not have any cardiovascular risk reduction data with Leqvio  yet. From a cost perspective Leqvio  would be more advantageous for patient as he does have a Medicare supplement which should cover the remaining 20% Reviewed side effects and dosing of Leqvio  Patient signed Leqvio  start form  Plan: After discussion patient would like to switch from Repatha  to Leqvio  Will submit start form to service center and referral to [facility] He is due for Repatha  today and I asked him to give himself an injection.  He does have a total of 4 more injections at home, I advised that it is up to him as to whether he wants to finish out his supply of Repatha .  He should make his infusion center appointment for 2 weeks after his last Repatha  injection.

## 2023-12-22 ENCOUNTER — Other Ambulatory Visit: Payer: Self-pay | Admitting: Pharmacist

## 2023-12-22 ENCOUNTER — Telehealth: Payer: Self-pay

## 2023-12-22 NOTE — Telephone Encounter (Signed)
Dr. Eden Emms, patient will be scheduled as soon as possible.  Auth Submission: NO AUTH NEEDED Site of care: Site of care: [facility] Payer: Medicare A/B with BCBS supplement Medication & CPT/J Code(s) submitted: Leqvio (Inclisiran) J1306 Route of submission (phone, fax, portal):  Phone # Fax # Auth type: Buy/Bill PB Units/visits requested: 284mg  x 3 doses Reference number:  Approval from: 12/22/23 to 01/05/25

## 2024-01-03 DIAGNOSIS — M1711 Unilateral primary osteoarthritis, right knee: Secondary | ICD-10-CM | POA: Diagnosis not present

## 2024-01-03 NOTE — Progress Notes (Signed)
Sent message, via epic in basket, requesting orders in epic from Careers adviser.

## 2024-01-05 NOTE — Care Plan (Addendum)
Ortho Bundle Case Management Note  Patient Details  Name: James Tate MRN: [id number] Date of Birth: 02-25-1946   met with patient in the office for H&P. will discharge to home with family to assist. has RW. HHPT Referral to [facility]. OPPT set up with [facility] discharge instructions discussed and questions answered. Patient and MD in agreement with plan. Choice offered                   DME Arranged:    DME Agency:     HH Arranged:  [facility]   [facility] Agency:     Additional Comments: Please contact me with any questions of if this plan should need to change.  Shauna Hugh,  RN,BSN,MHA,CCM  [facility]  [phone] 01/05/2024, 7:40 AM

## 2024-01-06 NOTE — Progress Notes (Signed)
 Surgery orders requested via Epic inbox.

## 2024-01-07 DIAGNOSIS — Z9889 Other specified postprocedural states: Secondary | ICD-10-CM

## 2024-01-07 HISTORY — DX: Other specified postprocedural states: Z98.890

## 2024-01-08 NOTE — Progress Notes (Addendum)
 COVID Vaccine received:  []  No [x]  Yes Date of any COVID positive Test in last 66 days:None  PCP - Milo Costa, MD at [facility]  [phone]  Cardiologist - Maude Emmer, MD , Katlyn West, NP 11-25-2023 telephone note in Epic Neurology -Darice Shivers, MD   Chest x-ray - 12-18-2018   2v Epic EKG -   11-14-2023  Epic  Stress Test -  ECHO - 08-02-2019  Epic Cardiac Cath -   PCR screen: [x]  Ordered & Completed []   No Order but Needs PROFEND     []   N/A for this surgery  Surgery Plan:  [x]  Ambulatory   []  Outpatient in bed  []  Admit Anesthesia:    []  General  [x]  Spinal  []   Choice []   MAC  Pacemaker / ICD device [x]  No []  Yes   Spinal Cord Stimulator:[x]  No []  Yes       History of Sleep Apnea? []  No [x]  Yes   CPAP used?- []  No [x]  Yes    Does the patient monitor blood sugar?   [x]  N/A   []  No []  Yes  Patient has: [x]  NO Hx DM   []  Pre-DM   []  DM1  []   DM2  Blood Thinner / Instructions:  none Aspirin  Instructions:  ASA 81 mg   Hold x 7 days   ERAS Protocol Ordered: []  No  []  Yes PRE-SURGERY []  ENSURE  []  G2   []  No Drink Ordered  Patient is to be NPO after: midnight prior d/t NO Orders at PST.   Dental hx: []  Dentures:  [x]  N/A      []  Bridge or Partial:                   []  Loose or Damaged teeth:   Comments: Patient was given the 5 CHG shower / bath instructions for TKA  surgery along with 2 bottles of the CHG soap. Patient will start this on:  01-19-2024 All questions were asked and answered, Patient voiced understanding of this process.   Activity level: Patient is able to climb a flight of stairs without difficulty; [x]  No CP  [x]  No SOB, but would have leg pain. Patient can perform ADLs without assistance.   Anesthesia review: Hx cryptogenic CVA-  some memory loss,  PFO, MVP, OSA-CPAP, HOH- doesn't wear HAs  Patient denies shortness of breath, fever, cough and chest pain at PAT appointment.  Patient verbalized understanding and agreement to the Pre-Surgical  Instructions that were given to them at this PAT appointment. Patient was also educated of the need to review these PAT instructions again prior to his surgery.I reviewed the appropriate phone numbers to call if they have any and questions or concerns.

## 2024-01-08 NOTE — Patient Instructions (Signed)
SURGICAL WAITING ROOM VISITATION Patients having surgery or a procedure may have no more than 2 support people in the waiting area - these visitors may rotate in the visitor waiting room.   Due to an increase in RSV and influenza rates and associated hospitalizations, children ages 88 and under may not visit patients in [facility] hospitals. If the patient needs to stay at the hospital during part of their recovery, the visitor guidelines for inpatient rooms apply.  PRE-OP VISITATION  Pre-op nurse will coordinate an appropriate time for 1 support person to accompany the patient in pre-op.  This support person may not rotate.  This visitor will be contacted when the time is appropriate for the visitor to come back in the pre-op area.  Please refer to the [facility] website for the visitor guidelines for Inpatients (after your surgery is over and you are in a regular room).  You are not required to quarantine at this time prior to your surgery. However, you must do this: Hand Hygiene often Do NOT share personal items Notify your provider if you are in close contact with someone who has COVID or you develop fever 100.4 or greater, new onset of sneezing, cough, sore throat, shortness of breath or body aches.  If you test positive for Covid or have been in contact with anyone that has tested positive in the last 10 days please notify you surgeon.    Your procedure is scheduled on:  MONDAY  January 23, 2024  Report to [facility] Main Entrance: [facility] [facility] entrance where the [employer] is available.   Report to admitting at: 06:30 AM  Call this number if you have any questions or problems the morning of surgery [phone]  Do not eat or drink anything after Midnight the night prior to your surgery/procedure.  FOLLOW ANY ADDITIONAL PRE OP INSTRUCTIONS YOU RECEIVED FROM YOUR SURGEON'S OFFICE!!!   Oral Hygiene is also important to reduce your risk of infection.        Remember -  BRUSH YOUR TEETH THE MORNING OF SURGERY WITH YOUR REGULAR TOOTHPASTE  Do NOT smoke after Midnight the night before surgery.  STOP TAKING all Vitamins, Herbs and supplements 1 week before your surgery.   Take ONLY these medicines the morning of surgery with A SIP OF WATER:  None   If You have been diagnosed with Sleep Apnea - Bring CPAP mask and tubing day of surgery. We will provide you with a CPAP machine on the day of your surgery.                   You may not have any metal on your body including , jewelry, and body piercing  Do not wear  lotions, powders, cologne, or deodorant  Men may shave face and neck.  Contacts, Hearing Aids, dentures or bridgework may not be worn into surgery. DENTURES WILL BE REMOVED PRIOR TO SURGERY PLEASE DO NOT APPLY "Poly grip" OR ADHESIVES!!!   Patients discharged on the day of surgery will not be allowed to drive home.  Someone NEEDS to stay with you for the first 24 hours after anesthesia.  Do not bring your home medications to the hospital. The Pharmacy will dispense medications listed on your medication list to you during your admission in the Hospital.  Special Instructions: Bring a copy of your healthcare power of attorney and living will documents the day of surgery, if you wish to have them scanned into your [facility] Medical Records-  EPIC  Please read over the following fact sheets you were given: IF YOU HAVE QUESTIONS ABOUT YOUR PRE-OP INSTRUCTIONS, PLEASE CALL [phone].     Pre-operative 5 CHG Bath Instructions   You can play a key role in reducing the risk of infection after surgery. Your skin needs to be as free of germs as possible. You can reduce the number of germs on your skin by washing with CHG (chlorhexidine gluconate) soap before surgery. CHG is an antiseptic soap that kills germs and continues to kill germs even after washing.   DO NOT use if you have an allergy to chlorhexidine/CHG or antibacterial soaps. If your skin  becomes reddened or irritated, stop using the CHG and notify one of our RNs at [phone]  Please shower with the CHG soap starting 4 days before surgery using the following schedule: START SHOWERS ON   THURSDAY  January 19, 2024                                                                                                                                                                              Please keep in mind the following:  DO NOT shave, including legs and underarms, starting the day of your first shower.   You may shave your face at any point before/day of surgery.   Place clean sheets on your bed the day you start using CHG soap. Use a clean washcloth (not used since being washed) for each shower. DO NOT sleep with pets once you start using the CHG.   CHG Shower Instructions:  If you choose to wash your hair and private area, wash first with your normal shampoo/soap.  After you use shampoo/soap, rinse your hair and body thoroughly to remove shampoo/soap residue.  Turn the water OFF and apply about 3 tablespoons (45 ml) of CHG soap to a CLEAN washcloth.  Apply CHG soap ONLY FROM YOUR NECK DOWN TO YOUR TOES (washing for 3-5 minutes)  DO NOT use CHG soap on face, private areas, open wounds, or sores.  Pay special attention to the area where your surgery is being performed.  If you are having back surgery, having someone wash your back for you may be helpful.  Wait 2 minutes after CHG soap is applied, then you may rinse off the CHG soap.  Pat dry with a clean towel  Put on clean clothes/pajamas   If you choose to wear lotion, please use ONLY the CHG-compatible lotions on the back of this paper.     Additional instructions for the day of surgery: DO NOT APPLY any lotions, deodorants, cologne, or perfumes.   Put on clean/comfortable clothes.  Brush your teeth.  Ask your nurse before  applying any prescription medications to the skin.      CHG Compatible Lotions    Aveeno Moisturizing lotion  Cetaphil Moisturizing Cream  Cetaphil Moisturizing Lotion  Clairol Herbal Essence Moisturizing Lotion, Dry Skin  Clairol Herbal Essence Moisturizing Lotion, Extra Dry Skin  Clairol Herbal Essence Moisturizing Lotion, Normal Skin  Curel Age Defying Therapeutic Moisturizing Lotion with Alpha Hydroxy  Curel Extreme Care Body Lotion  Curel Soothing Hands Moisturizing Hand Lotion  Curel Therapeutic Moisturizing Cream, Fragrance-Free  Curel Therapeutic Moisturizing Lotion, Fragrance-Free  Curel Therapeutic Moisturizing Lotion, Original Formula  Eucerin Daily Replenishing Lotion  Eucerin Dry Skin Therapy Plus Alpha Hydroxy Crme  Eucerin Dry Skin Therapy Plus Alpha Hydroxy Lotion  Eucerin Original Crme  Eucerin Original Lotion  Eucerin Plus Crme Eucerin Plus Lotion  Eucerin TriLipid Replenishing Lotion  Keri Anti-Bacterial Hand Lotion  Keri Deep Conditioning Original Lotion Dry Skin Formula Softly Scented  Keri Deep Conditioning Original Lotion, Fragrance Free Sensitive Skin Formula  Keri Lotion Fast Absorbing Fragrance Free Sensitive Skin Formula  Keri Lotion Fast Absorbing Softly Scented Dry Skin Formula  Keri Original Lotion  Keri Skin Renewal Lotion Keri Silky Smooth Lotion  Keri Silky Smooth Sensitive Skin Lotion  Nivea Body Creamy Conditioning Oil  Nivea Body Extra Enriched Lotion  Nivea Body Original Lotion  Nivea Body Sheer Moisturizing Lotion Nivea Crme  Nivea Skin Firming Lotion  NutraDerm 30 Skin Lotion  NutraDerm Skin Lotion  NutraDerm Therapeutic Skin Cream  NutraDerm Therapeutic Skin Lotion  ProShield Protective Hand Cream  Provon moisturizing lotion   FAILURE TO FOLLOW THESE INSTRUCTIONS MAY RESULT IN THE CANCELLATION OF YOUR SURGERY  PATIENT SIGNATURE_________________________________  NURSE SIGNATURE__________________________________  ________________________________________________________________________      James Tate    An incentive spirometer is a tool that can help keep your lungs clear and active. This tool measures how well you are filling your lungs with each breath. Taking long deep breaths may help reverse or decrease the chance of developing breathing (pulmonary) problems (especially infection) following: A long period of time when you are unable to move or be active. BEFORE THE PROCEDURE  If the spirometer includes an indicator to show your best effort, your nurse or respiratory therapist will set it to a desired goal. If possible, sit up straight or lean slightly forward. Try not to slouch. Hold the incentive spirometer in an upright position. INSTRUCTIONS FOR USE  Sit on the edge of your bed if possible, or sit up as far as you can in bed or on a chair. Hold the incentive spirometer in an upright position. Breathe out normally. Place the mouthpiece in your mouth and seal your lips tightly around it. Breathe in slowly and as deeply as possible, raising the piston or the ball toward the top of the column. Hold your breath for 3-5 seconds or for as long as possible. Allow the piston or ball to fall to the bottom of the column. Remove the mouthpiece from your mouth and breathe out normally. Rest for a few seconds and repeat Steps 1 through 7 at least 10 times every 1-2 hours when you are awake. Take your time and take a few normal breaths between deep breaths. The spirometer may include an indicator to show your best effort. Use the indicator as a goal to work toward during each repetition. After each set of 10 deep breaths, practice coughing to be sure your lungs are clear. If you have an incision (the cut made at the time of surgery), support  your incision when coughing by placing a pillow or rolled up towels firmly against it. Once you are able to get out of bed, walk around indoors and cough well. You may stop using the incentive spirometer when instructed by your caregiver.  RISKS AND  COMPLICATIONS Take your time so you do not get dizzy or light-headed. If you are in pain, you may need to take or ask for pain medication before doing incentive spirometry. It is harder to take a deep breath if you are having pain. AFTER USE Rest and breathe slowly and easily. It can be helpful to keep track of a log of your progress. Your caregiver can provide you with a simple table to help with this. If you are using the spirometer at home, follow these instructions: SEEK MEDICAL CARE IF:  You are having difficultly using the spirometer. You have trouble using the spirometer as often as instructed. Your pain medication is not giving enough relief while using the spirometer. You develop fever of 100.5 F (38.1 C) or higher.                                                                                                    SEEK IMMEDIATE MEDICAL CARE IF:  You cough up bloody sputum that had not been present before. You develop fever of 102 F (38.9 C) or greater. You develop worsening pain at or near the incision site. MAKE SURE YOU:  Understand these instructions. Will watch your condition. Will get help right away if you are not doing well or get worse. Document Released: 04/04/2007 Document Revised: 02/14/2012 Document Reviewed: 06/05/2007 [facility] Patient Information 2014 [place], [place].    If you would like to see a video about joint replacement:   [email or web address]

## 2024-01-10 ENCOUNTER — Encounter (HOSPITAL_COMMUNITY)
Admission: RE | Admit: 2024-01-10 | Discharge: 2024-01-10 | Disposition: A | Discharge status: Home / Self Care | Payer: Medicare Other | Source: Ambulatory Visit | Attending: Orthopedic Surgery | Admitting: Orthopedic Surgery

## 2024-01-10 ENCOUNTER — Other Ambulatory Visit: Payer: Self-pay

## 2024-01-10 ENCOUNTER — Encounter (HOSPITAL_COMMUNITY): Payer: Self-pay

## 2024-01-10 VITALS — BP 108/62 | HR 74 | Temp 98.6°F | Resp 16 | Ht 72.0 in | Wt 189.0 lb

## 2024-01-10 DIAGNOSIS — G4733 Obstructive sleep apnea (adult) (pediatric): Secondary | ICD-10-CM | POA: Insufficient documentation

## 2024-01-10 DIAGNOSIS — I2510 Atherosclerotic heart disease of native coronary artery without angina pectoris: Secondary | ICD-10-CM | POA: Diagnosis not present

## 2024-01-10 DIAGNOSIS — Z01812 Encounter for preprocedural laboratory examination: Secondary | ICD-10-CM | POA: Diagnosis not present

## 2024-01-10 DIAGNOSIS — Z8546 Personal history of malignant neoplasm of prostate: Secondary | ICD-10-CM | POA: Diagnosis not present

## 2024-01-10 DIAGNOSIS — Z87891 Personal history of nicotine dependence: Secondary | ICD-10-CM | POA: Insufficient documentation

## 2024-01-10 DIAGNOSIS — M1711 Unilateral primary osteoarthritis, right knee: Secondary | ICD-10-CM | POA: Insufficient documentation

## 2024-01-10 DIAGNOSIS — Z8673 Personal history of transient ischemic attack (TIA), 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 residual deficits: Secondary | ICD-10-CM | POA: Diagnosis not present

## 2024-01-10 DIAGNOSIS — I340 Nonrheumatic mitral (valve) insufficiency: Secondary | ICD-10-CM | POA: Insufficient documentation

## 2024-01-10 DIAGNOSIS — Z01818 Encounter for other preprocedural examination: Secondary | ICD-10-CM

## 2024-01-10 LAB — BASIC METABOLIC PANEL
Anion gap: 11 (ref 5–15)
BUN: 15 mg/dL (ref 8–23)
CO2: 25 mmol/L (ref 22–32)
Calcium: 9.5 mg/dL (ref 8.9–10.3)
Chloride: 101 mmol/L (ref 98–111)
Creatinine, Ser: 0.64 mg/dL (ref 0.61–1.24)
GFR, Estimated: >60 mL/min (ref >60)
Glucose, Bld: 93 mg/dL (ref 70–99)
Potassium: 3.8 mmol/L (ref 3.5–5.1)
Sodium: 137 mmol/L (ref 135–145)

## 2024-01-10 LAB — CBC
HCT: 40.1 % (ref 39.0–52.0)
Hemoglobin: 13 g/dL (ref 13.0–17.0)
MCH: 29.6 pg (ref 26.0–34.0)
MCHC: 32.4 g/dL (ref 30.0–36.0)
MCV: 91.3 fL (ref 80.0–100.0)
Platelets: 224 10*3/uL (ref 150–400)
RBC: 4.39 MIL/uL (ref 4.22–5.81)
RDW: 12.8 % (ref 11.5–15.5)
WBC: 5.7 10*3/uL (ref 4.0–10.5)
nRBC: 0 % (ref 0.0–0.2)

## 2024-01-10 LAB — SURGICAL PCR SCREEN
MRSA, PCR: NEGATIVE
Staphylococcus aureus: POSITIVE — AB

## 2024-01-11 ENCOUNTER — Encounter (HOSPITAL_COMMUNITY): Payer: Self-pay

## 2024-01-11 NOTE — Progress Notes (Signed)
 Patient's PCR screen is positive for STAPH. Appropriate notes have been placed on the patient's chart. This note has been routed to Dr.Graves and [place] for review. The Patient's surgery is currently scheduled for:  01-23-2024 at [facility].  Shawnee Aloe, BSN, CVRN-BC   Pre-Surgical Testing Nurse [facility]  [phone]

## 2024-01-11 NOTE — Progress Notes (Signed)
 Case: [id number] Date/Time: 01/23/24 0845   Procedure: TOTAL KNEE ARTHROPLASTY (Right: Knee)   Anesthesia type: Spinal   Pre-op diagnosis: RIGHT KNEE OSTEOARTHRITIS   [facility] 06 [facility]   Surgeons: Yvone Rush, MD       DISCUSSION: James Tate is a 78 yo male who presents to PAT prior to surgery above. 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  Patient follows with Cardiology after he had a cryptogenic CVA in 2016. He had an echo which showed PFO with atrial septal aneurysm. Medical management was recommended unless he had a recurrent event and then closure would be considered. Also underwent cardiac monitoring with ILR which did not show A.fib. Additionally he has mild-moderate MR which is being monitored. Last seen in clinic on 11/14/23 and all issues stable at that time. Advised f/u in 1 year.  Patient follows with Neurology for memory loss, hx of CVA, and for OSA and he uses a CPAP.  VS: BP 108/62 Comment: right arm sitting  Pulse 74   Temp 37 C (Oral)   Resp 16   Ht 6' (1.829 m)   Wt 85.7 kg   SpO2 98%   BMI 25.63 kg/m   PROVIDERS: PCP: Milo Costa, MD at [facility] Cardiologist:   Maude Emmer, MD  Neurology: Darice Shivers, MD  LABS: Labs reviewed: Acceptable for surgery. (all labs ordered are listed, but only abnormal results are displayed)  Labs Reviewed  SURGICAL PCR SCREEN - Abnormal; Notable for the following components:      Result Value   Staphylococcus aureus POSITIVE (*)    All other components within normal limits  BASIC METABOLIC PANEL  CBC     IMAGES:   EKG:   CV:  Echo 08/02/2019:  IMPRESSIONS    1. The left ventricle has normal systolic function with an ejection fraction of 60-65%. The cavity size was normal. Moderate basal septal hypertropy. Left ventricular diastolic parameters were normal.  2. The right  ventricle has normal systolic function. The cavity was normal. There is no increase in right ventricular wall thickness. Right ventricular systolic pressure could not be assessed.  3. The aortic valve is tricuspid. Mild sclerosis of the aortic valve.  4. The aorta is normal unless otherwise noted.  5. There is redundancy of the interatrial septum.   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   Flat feet    Glaucoma    left eye   History of colon polyps    History of hyperparathyroidism    Melanoma of back (HCC)    MR (mitral regurgitation) 05/16/2018   Moderate noted on ECHO   MVP (mitral valve prolapse) 05/16/2018   Noted on ECHO   OSA on CPAP    cpap set on 4   Osteopenia    resolved with medical therapy (calcium  and fosamax)   PFO (patent foramen ovale)    Sensorineural hearing loss (SNHL) of both ears    Does Not wear HAs    Past Surgical History:  Procedure Laterality Date   APPLICATION OF A-CELL OF BACK N/A 12/19/2014   Procedure: APPLICATION OF A-CELL OF BACK;  Surgeon: Estefana Reichert, DO;  Location: [facility];  Service: Plastics;  Laterality: N/A;   CHOLECYSTECTOMY  1996   COLONOSCOPY     EP IMPLANTABLE DEVICE N/A 07/02/2015   Procedure: Loop Recorder Insertion;  Surgeon: Lynwood Rakers, MD;  [facility];  Service: Cardiovascular;  Laterality: N/A;   GOLD SEED IMPLANT N/A 07/29/2022   Procedure: GOLD SEED IMPLANT;  Surgeon: Renda Glance, MD;  Location: [facility];  Service: Urology;  Laterality: N/A;  ONLY NEEDS 30 MIN   implantable loop recorder removal  12/13/2018   MDT LINQ removed in office by Dr Rakers   LASIK  2003   MELANOMA EXCISION N/A 10/04/2014   Procedure: WIDE LOCAL EXCISION OF UPPER BACK MELANOMA ;  Surgeon: Camellia CHRISTELLA Blush, MD;  [facility];  Service: General;  Laterality: N/A;   PARATHYROIDECTOMY   2004   SPACE OAR INSTILLATION N/A 07/29/2022   Procedure: SPACE OAR INSTILLATION;  Surgeon: Renda Glance, MD;  Location: [facility];  Service: Urology;  Laterality: N/A;   TEE WITHOUT CARDIOVERSION N/A 06/12/2015   Procedure: TRANSESOPHAGEAL ECHOCARDIOGRAM (TEE);  Surgeon: Maude JAYSON Emmer, MD;  Location: [facility] ENDOSCOPY;  Service: Cardiovascular;  Laterality: N/A;   TONSILLECTOMY     as child   TOTAL KNEE ARTHROPLASTY Left 12/22/2018   Procedure: TOTAL KNEE ARTHROPLASTY;  Surgeon: Yvone Rush, MD;  [facility];  Service: Orthopedics;  Laterality: Left;    MEDICATIONS:  aspirin  EC 81 MG tablet   buPROPion  ER (WELLBUTRIN  SR) 100 MG 12 hr tablet   Cholecalciferol  (D2000 ULTRA STRENGTH) 50 MCG (2000 UT) CAPS   cyanocobalamin  (VITAMIN B12) 1000 MCG tablet   diphenhydramine -acetaminophen  (TYLENOL  PM) 25-500 MG TABS tablet   Multiple Minerals-Vitamins (CAL-MAG-ZINC -D PO)   Multiple Vitamin (MULTIVITAMIN) capsule   Omega-3 Fatty Acids (OMEGA 3 PO)   REPATHA  SURECLICK 140 MG/ML SOAJ   tadalafil (CIALIS) 20 MG tablet   triamcinolone (KENALOG) 0.1 % paste   Vitamin K , Phytonadione , 100 MCG TABS   No current facility-administered medications for this encounter.    Burnard CHRISTELLA Odis DEVONNA [facility] Short Stay/Anesthesiology [facility] Phone [phone] 01/11/2024 12:58 PM

## 2024-01-11 NOTE — Anesthesia Preprocedure Evaluation (Addendum)
Anesthesia Evaluation  Patient identified by MRN, date of birth, ID band Patient awake    Reviewed: Allergy & Precautions, NPO status , Patient's Chart, lab work & pertinent test results  Airway Mallampati: I  TM Distance: >3 FB Neck ROM: Full    Dental  (+) Teeth Intact, Dental Advisory Given   Pulmonary sleep apnea and Continuous Positive Airway Pressure Ventilation , former smoker   breath sounds clear to auscultation       Cardiovascular negative cardio ROS  Rhythm:Regular Rate:Normal  Echo: 1. The left ventricle has normal systolic function with an ejection  fraction of 60-65%. The cavity size was normal. Moderate basal septal  hypertropy. Left ventricular diastolic parameters were normal.   2. The right ventricle has normal systolic function. The cavity was  normal. There is no increase in right ventricular wall thickness. Right  ventricular systolic pressure could not be assessed.   3. The aortic valve is tricuspid. Mild sclerosis of the aortic valve.   4. The aorta is normal unless otherwise noted.   5. There is redundancy of the interatrial septum.     Neuro/Psych CVA  negative psych ROS   GI/Hepatic negative GI ROS, Neg liver ROS,,,  Endo/Other  negative endocrine ROS    Renal/GU negative Renal ROS     Musculoskeletal  (+) Arthritis ,    Abdominal   Peds  Hematology negative hematology ROS (+)   Anesthesia Other Findings   Reproductive/Obstetrics                             Anesthesia Physical Anesthesia Plan  ASA: 2  Anesthesia Plan: Spinal   Post-op Pain Management: Regional block*   Induction: Intravenous  PONV Risk Score and Plan: 2 and Dexamethasone, Propofol infusion and Ondansetron  Airway Management Planned: Natural Airway and Nasal Cannula  Additional Equipment: None  Intra-op Plan:   Post-operative Plan:   Informed Consent: I have reviewed the  patients History and Physical, chart, labs and discussed the procedure including the risks, benefits and alternatives for the proposed anesthesia with the patient or authorized representative who has indicated his/her understanding and acceptance.       Plan Discussed with: CRNA  Anesthesia Plan Comments: (See PAT note from 2/4 by Sherlie Ban PA-C  Lab Results      Component                Value               Date                      WBC                      5.7                 01/10/2024                HGB                      13.0                01/10/2024                HCT                      40.1  01/10/2024                MCV                      91.3                01/10/2024                PLT                      224                 01/10/2024           )        Anesthesia Quick Evaluation

## 2024-01-12 ENCOUNTER — Other Ambulatory Visit: Payer: Self-pay | Admitting: Orthopedic Surgery

## 2024-01-18 DIAGNOSIS — H401131 Primary open-angle glaucoma, bilateral, mild stage: Secondary | ICD-10-CM | POA: Diagnosis not present

## 2024-01-22 NOTE — H&P (Signed)
TOTAL KNEE ADMISSION H&P  Patient is being admitted for right total knee arthroplasty.  Subjective:  Chief Complaint:right knee pain.  HPI: James Tate, 78 y.o. male, has a history of pain and functional disability in the right knee due to arthritis and has failed non-surgical conservative treatments for greater than 12 weeks to includecorticosteriod injections, flexibility and strengthening excercises, supervised PT with diminished ADL's post treatment, weight reduction as appropriate, and activity modification.  Onset of symptoms was gradual, starting 3 years ago with gradually worsening course since that time. The patient noted no past surgery on the right knee(s).  Patient currently rates pain in the right knee(s) at 8 out of 10 with activity. Patient has night pain, worsening of pain with activity and weight bearing, pain that interferes with activities of daily living, crepitus, and joint swelling.  Patient has evidence of subchondral sclerosis, periarticular osteophytes, and joint space narrowing by imaging studies. There is no active infection.  Patient Active Problem List   Diagnosis Date Noted   Amnesia 03/22/2022   Atrial septal defect within oval fossa 03/22/2022   Erectile dysfunction 03/22/2022   Hearing loss 03/22/2022   History of malignant melanoma 03/22/2022   Hypercholesterolemia 03/22/2022   Increased bilirubin level 03/22/2022   Obstructive sleep apnea (adult) (pediatric) 03/22/2022   Osteopenia 03/22/2022   History of colonic polyps 03/22/2022   Personal history of transient ischemic attack (TIA), 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 residual deficits 03/22/2022   Malignant neoplasm of prostate (HCC) 03/21/2022   Primary osteoarthritis of left knee 12/22/2018   Sensorineural hearing loss (SNHL) of both ears 09/29/2018   Cryptogenic stroke (HCC) 07/02/2015   Melanoma of back (HCC) 12/10/2014   Wound of back 12/10/2014   Past Medical History:  Diagnosis Date    Arthritis    BPH (benign prostatic hyperplasia)    Cor triatriatum    Cryptogenic stroke (HCC) 03/2015   Flat feet    Glaucoma    left eye   History of colon polyps    History of hyperparathyroidism    Melanoma of back (HCC)    MR (mitral regurgitation) 05/16/2018   Moderate noted on ECHO   MVP (mitral valve prolapse) 05/16/2018   Noted on ECHO   OSA on CPAP    cpap set on 4   Osteopenia    resolved with medical therapy (calcium and fosamax)   PFO (patent foramen ovale)    Sensorineural hearing loss (SNHL) of both ears    Does Not wear HAs    Past Surgical History:  Procedure Laterality Date   APPLICATION OF A-CELL OF BACK N/A 12/19/2014   Procedure: APPLICATION OF A-CELL OF BACK;  Surgeon: Wayland Denis, DO;  Location: [facility];  Service: Plastics;  Laterality: N/A;   CHOLECYSTECTOMY  1996   COLONOSCOPY     EP IMPLANTABLE DEVICE N/A 07/02/2015   Procedure: Loop Recorder Insertion;  Surgeon: Hillis Range, MD;  [facility];  Service: Cardiovascular;  Laterality: N/A;   GOLD SEED IMPLANT N/A 07/29/2022   Procedure: GOLD SEED IMPLANT;  Surgeon: Heloise Purpura, MD;  [facility]  Service: Urology;  Laterality: N/A;  ONLY NEEDS 30 MIN   implantable loop recorder removal  12/13/2018   MDT LINQ removed in office by Dr Johney Frame   LASIK  2003   MELANOMA EXCISION N/A 10/04/2014   Procedure: WIDE LOCAL EXCISION OF UPPER BACK MELANOMA ;  Surgeon: Atilano Ina, MD;  [facility];  Service: General;  Laterality: N/A;   PARATHYROIDECTOMY  2004   SPACE OAR INSTILLATION N/A 07/29/2022   Procedure: SPACE OAR INSTILLATION;  Surgeon: Heloise Purpura, MD;  Location[facility];  Service: Urology;  Laterality: N/A;   TEE WITHOUT CARDIOVERSION N/A 06/12/2015   Procedure: TRANSESOPHAGEAL ECHOCARDIOGRAM (TEE);  Surgeon: Wendall Stade, MD;  Location: [facility] ENDOSCOPY;  Service: Cardiovascular;  Laterality: N/A;   TONSILLECTOMY      as child   TOTAL KNEE ARTHROPLASTY Left 12/22/2018   Procedure: TOTAL KNEE ARTHROPLASTY;  Surgeon: Jodi Geralds, MD;  [facility];  Service: Orthopedics;  Laterality: Left;    No current facility-administered medications for this encounter.   Current Outpatient Medications  Medication Sig Dispense Refill Last Dose/Taking   aspirin EC 81 MG tablet Take 81 mg by mouth daily.   Taking   buPROPion ER (WELLBUTRIN SR) 100 MG 12 hr tablet Take 100 mg by mouth at bedtime.   Taking   Cholecalciferol (D2000 ULTRA STRENGTH) 50 MCG (2000 UT) CAPS Take 2,000 Units by mouth daily.   Taking   cyanocobalamin (VITAMIN B12) 1000 MCG tablet Take 1,000 mcg by mouth every morning.   Taking   diphenhydramine-acetaminophen (TYLENOL PM) 25-500 MG TABS tablet Take 2 tablets by mouth at bedtime.   Taking   Multiple Minerals-Vitamins (CAL-MAG-ZINC-D PO) Take 1 tablet by mouth daily.   Taking   Multiple Vitamin (MULTIVITAMIN) capsule Take 1 capsule by mouth daily.   Taking   Omega-3 Fatty Acids (OMEGA 3 PO) Take 1 capsule by mouth every morning.   Taking   REPATHA SURECLICK 140 MG/ML SOAJ ADMINISTER 1 ML UNDER THE SKIN EVERY 14 DAYS 2 mL 11 Taking   triamcinolone (KENALOG) 0.1 % paste Use as directed 1 application in the mouth or throat 2 (two) times daily as needed (mouth sores).   Taking As Needed   Vitamin K, Phytonadione, 100 MCG TABS Take 100 mcg by mouth daily.    Taking   tadalafil (CIALIS) 20 MG tablet Take 20 mg by mouth daily as needed for erectile dysfunction.      Allergies  Allergen Reactions   Atorvastatin Other (See Comments)    Aches in legs   Codeine Other (See Comments)    Headache.    Erythromycin     Childhood allergy - unknown   Rosuvastatin Calcium     Memory problem (03/2020) Aches in   legs    Social History   Tobacco Use   Smoking status: Former    Current packs/day: 0.00    Average packs/day: 1 pack/day for 6.0 years (6.0 ttl pk-yrs)    Types: Cigarettes    Start date:  09/26/1965    Quit date: 09/27/1971    Years since quitting: 52.3   Smokeless tobacco: Never  Substance Use Topics   Alcohol use: Yes    Alcohol/week: 2.0 standard drinks of alcohol    Types: 2 Glasses of wine per week    Comment: 1 or  2 glasses per week    Family History  Problem Relation Age of Onset   Heart Problems Mother        valve replacement & pacemaker   Osteoporosis Father    Other Maternal Grandfather        poss MI, per pt     Review of Systems neg  Objective: Physical Exam There were no vitals taken for this visit.  General Appearance:    Alert, cooperative, no distress, appears stated age  Head:    Normocephalic, without obvious abnormality, atraumatic  Eyes:    PERRL, conjunctiva/corneas clear, EOM's intact, fundi    benign, both eyes       Ears:    Normal TM's and external ear canals, both ears  Nose:   Nares normal, septum midline, mucosa normal, no drainage   or sinus tenderness  Throat:   Lips, mucosa, and tongue normal; teeth and gums normal  Neck:   Supple, symmetrical, trachea midline, no adenopathy;       thyroid:  No enlargement/tenderness/nodules; no carotid   bruit or JVD  Back:     Symmetric, no curvature, ROM normal, no CVA tenderness  Lungs:     Clear to auscultation bilaterally, respirations unlabored  Chest wall:    No tenderness or deformity  Heart:    Regular rate and rhythm, S1 and S2 normal, no murmur, rub   or gallop  Abdomen:     Soft, non-tender, bowel sounds active all four quadrants,    no masses, no organomegaly        Extremities:   Extremities normal, atraumatic, no cyanosis or edema Right knee exam: Global tenderness to palpation around the right knee.  Mild pain through range of motion.  No instability.  No erythema or warmth  Pulses:   2+ and symmetric all extremities  Skin:   Skin color, texture, turgor normal, no rashes or lesions  Lymph nodes:   Cervical, supraclavicular, and axillary nodes normal  Neurologic:    CNII-XII intact. Normal strength, sensation and reflexes      throughout      Vital signs in last 24 hours:    Labs: Recent Results (from the past 2160 hours)  Basic metabolic panel per protocol     Status: None   Collection Time: 01/10/24 11:24 AM  Result Value Ref Range   Sodium 137 135 - 145 mmol/L   Potassium 3.8 3.5 - 5.1 mmol/L   Chloride 101 98 - 111 mmol/L   CO2 25 22 - 32 mmol/L   Glucose, Bld 93 70 - 99 mg/dL    Comment: Glucose reference range applies only to samples taken after fasting for at least 8 hours.   BUN 15 8 - 23 mg/dL   Creatinine, Ser 1.61 0.61 - 1.24 mg/dL   Calcium 9.5 8.9 - 09.6 mg/dL   GFR, Estimated >04 >54 mL/min    Comment: (NOTE) Calculated using the CKD-EPI Creatinine Equation (2021)    Anion gap 11 5 - 15    Comment: Performed at [facility], [address] [place], [place] [address]  CBC per protocol     Status: None   Collection Time: 01/10/24 11:24 AM  Result Value Ref Range   WBC 5.7 4.0 - 10.5 K/uL   RBC 4.39 4.22 - 5.81 MIL/uL   Hemoglobin 13.0 13.0 - 17.0 g/dL   HCT 91.4 78.2 - 95.6 %   MCV 91.3 80.0 - 100.0 fL   MCH 29.6 26.0 - 34.0 pg   MCHC 32.4 30.0 - 36.0 g/dL   RDW 21.3 08.6 - 57.8 %   Platelets 224 150 - 400 K/uL   nRBC 0.0 0.0 - 0.2 %    Comment: Performed at [facility], [address] [place], [place] [address]  Surgical pcr screen     Status: Abnormal   Collection Time: 01/10/24 11:55 AM   Specimen: Nasal Mucosa; Nasal Swab  Result Value Ref Range   MRSA, PCR  NEGATIVE NEGATIVE   Staphylococcus aureus POSITIVE (A) NEGATIVE    Comment: (NOTE) The Xpert SA Assay (FDA approved for NASAL specimens in patients 50 years of age and older), is one component of a comprehensive surveillance program. It is not intended to diagnose infection nor to guide or monitor treatment. Performed at [facility], [address], [place], [place] [address]      Estimated  body mass index is 25.63 kg/m as calculated from the following:   Height as of 01/10/24: 6' (1.829 m).   Weight as of 01/10/24: 85.7 kg.   Imaging Review Plain radiographs demonstrate severe degenerative joint disease of the right knee(s). The overall alignment ismild varus. The bone quality appears to be good for age and reported activity level.      Assessment/Plan:  End stage arthritis, right knee   The patient history, physical examination, clinical judgment of the provider and imaging studies are consistent with end stage degenerative joint disease of the right knee(s) and total knee arthroplasty is deemed medically necessary. The treatment options including medical management, injection therapy arthroscopy and arthroplasty were discussed at length. The risks and benefits of total knee arthroplasty were presented and reviewed. The risks due to aseptic loosening, infection, stiffness, patella tracking problems, thromboembolic complications and other imponderables were discussed. The patient acknowledged the explanation, agreed to proceed with the plan and consent was signed. Patient is being admitted for inpatient treatment for surgery, pain control, PT, OT, prophylactic antibiotics, VTE prophylaxis, progressive ambulation and ADL's and discharge planning. The patient is planning to be discharged home with home health services     Patient's anticipated LOS is less than 2 midnights, meeting these requirements:  - Lives within 1 hour of care - Has a competent adult at home to recover with post-op recover - NO history of  - Chronic pain requiring opiods  - Diabetes  - Coronary Artery Disease  - Heart failure  - Heart attack  - Stroke  - DVT/VTE  - Cardiac arrhythmia  - Respiratory Failure/COPD  - Renal failure  - Anemia  - Advanced Liver disease

## 2024-01-23 ENCOUNTER — Encounter (HOSPITAL_COMMUNITY)
Admission: RE | Disposition: A | Discharge status: Home / Self Care | Payer: Self-pay | Source: Ambulatory Visit | Attending: Orthopedic Surgery

## 2024-01-23 ENCOUNTER — Other Ambulatory Visit: Payer: Self-pay

## 2024-01-23 ENCOUNTER — Ambulatory Visit (HOSPITAL_COMMUNITY)
Admission: RE | Admit: 2024-01-23 | Discharge: 2024-01-23 | Disposition: A | Discharge status: Home / Self Care | Payer: Medicare Other | Source: Ambulatory Visit | Attending: Orthopedic Surgery | Admitting: Orthopedic Surgery

## 2024-01-23 ENCOUNTER — Other Ambulatory Visit (HOSPITAL_COMMUNITY): Payer: Self-pay

## 2024-01-23 ENCOUNTER — Encounter (HOSPITAL_COMMUNITY): Payer: Self-pay | Admitting: Orthopedic Surgery

## 2024-01-23 ENCOUNTER — Ambulatory Visit (HOSPITAL_COMMUNITY): Payer: Medicare Other | Admitting: Medical

## 2024-01-23 ENCOUNTER — Ambulatory Visit (HOSPITAL_COMMUNITY): Payer: Medicare Other | Admitting: Anesthesiology

## 2024-01-23 ENCOUNTER — Telehealth: Payer: Self-pay | Admitting: Pharmacy Technician

## 2024-01-23 ENCOUNTER — Encounter: Payer: Self-pay | Admitting: Cardiovascular Disease

## 2024-01-23 DIAGNOSIS — K567 Ileus, unspecified: Secondary | ICD-10-CM | POA: Diagnosis not present

## 2024-01-23 DIAGNOSIS — G4730 Sleep apnea, unspecified: Secondary | ICD-10-CM | POA: Insufficient documentation

## 2024-01-23 DIAGNOSIS — Z8546 Personal history of malignant neoplasm of prostate: Secondary | ICD-10-CM | POA: Diagnosis not present

## 2024-01-23 DIAGNOSIS — R627 Adult failure to thrive: Secondary | ICD-10-CM | POA: Diagnosis present

## 2024-01-23 DIAGNOSIS — Z87891 Personal history of nicotine dependence: Secondary | ICD-10-CM | POA: Diagnosis not present

## 2024-01-23 DIAGNOSIS — F419 Anxiety disorder, unspecified: Secondary | ICD-10-CM | POA: Diagnosis present

## 2024-01-23 DIAGNOSIS — K5900 Constipation, unspecified: Secondary | ICD-10-CM | POA: Diagnosis not present

## 2024-01-23 DIAGNOSIS — I358 Other nonrheumatic aortic valve disorders: Secondary | ICD-10-CM | POA: Insufficient documentation

## 2024-01-23 DIAGNOSIS — M7989 Other specified soft tissue disorders: Secondary | ICD-10-CM | POA: Diagnosis not present

## 2024-01-23 DIAGNOSIS — Z79899 Other long term (current) drug therapy: Secondary | ICD-10-CM | POA: Insufficient documentation

## 2024-01-23 DIAGNOSIS — N400 Enlarged prostate without lower urinary tract symptoms: Secondary | ICD-10-CM | POA: Diagnosis present

## 2024-01-23 DIAGNOSIS — Z7982 Long term (current) use of aspirin: Secondary | ICD-10-CM | POA: Insufficient documentation

## 2024-01-23 DIAGNOSIS — Z7901 Long term (current) use of anticoagulants: Secondary | ICD-10-CM | POA: Diagnosis not present

## 2024-01-23 DIAGNOSIS — R0602 Shortness of breath: Secondary | ICD-10-CM | POA: Diagnosis present

## 2024-01-23 DIAGNOSIS — I700 Atherosclerosis of aorta: Secondary | ICD-10-CM | POA: Diagnosis present

## 2024-01-23 DIAGNOSIS — M8580 Other specified disorders of bone density and structure, unspecified site: Secondary | ICD-10-CM | POA: Insufficient documentation

## 2024-01-23 DIAGNOSIS — M1711 Unilateral primary osteoarthritis, right knee: Secondary | ICD-10-CM

## 2024-01-23 DIAGNOSIS — I2510 Atherosclerotic heart disease of native coronary artery without angina pectoris: Secondary | ICD-10-CM | POA: Diagnosis not present

## 2024-01-23 DIAGNOSIS — Z9049 Acquired absence of other specified parts of digestive tract: Secondary | ICD-10-CM | POA: Diagnosis not present

## 2024-01-23 DIAGNOSIS — Q2112 Patent foramen ovale: Secondary | ICD-10-CM | POA: Diagnosis not present

## 2024-01-23 DIAGNOSIS — I2699 Other pulmonary embolism without acute cor pulmonale: Secondary | ICD-10-CM | POA: Diagnosis not present

## 2024-01-23 DIAGNOSIS — Z96652 Presence of left artificial knee joint: Secondary | ICD-10-CM | POA: Diagnosis present

## 2024-01-23 DIAGNOSIS — E876 Hypokalemia: Secondary | ICD-10-CM | POA: Diagnosis present

## 2024-01-23 DIAGNOSIS — E869 Volume depletion, unspecified: Secondary | ICD-10-CM | POA: Diagnosis not present

## 2024-01-23 DIAGNOSIS — G4733 Obstructive sleep apnea (adult) (pediatric): Secondary | ICD-10-CM | POA: Insufficient documentation

## 2024-01-23 DIAGNOSIS — R110 Nausea: Secondary | ICD-10-CM | POA: Diagnosis not present

## 2024-01-23 DIAGNOSIS — E871 Hypo-osmolality and hyponatremia: Secondary | ICD-10-CM | POA: Diagnosis present

## 2024-01-23 DIAGNOSIS — R509 Fever, unspecified: Secondary | ICD-10-CM | POA: Diagnosis not present

## 2024-01-23 DIAGNOSIS — Z1152 Encounter for screening for COVID-19: Secondary | ICD-10-CM | POA: Diagnosis not present

## 2024-01-23 DIAGNOSIS — K9189 Other postprocedural complications and disorders of digestive system: Secondary | ICD-10-CM | POA: Diagnosis not present

## 2024-01-23 DIAGNOSIS — R112 Nausea with vomiting, unspecified: Secondary | ICD-10-CM | POA: Diagnosis not present

## 2024-01-23 DIAGNOSIS — D62 Acute posthemorrhagic anemia: Secondary | ICD-10-CM | POA: Diagnosis not present

## 2024-01-23 DIAGNOSIS — G8918 Other acute postprocedural pain: Secondary | ICD-10-CM | POA: Diagnosis not present

## 2024-01-23 DIAGNOSIS — Z85820 Personal history of malignant melanoma of skin: Secondary | ICD-10-CM | POA: Diagnosis not present

## 2024-01-23 DIAGNOSIS — I2609 Other pulmonary embolism with acute cor pulmonale: Secondary | ICD-10-CM | POA: Diagnosis not present

## 2024-01-23 DIAGNOSIS — I340 Nonrheumatic mitral (valve) insufficiency: Secondary | ICD-10-CM | POA: Diagnosis present

## 2024-01-23 DIAGNOSIS — Z96651 Presence of right artificial knee joint: Secondary | ICD-10-CM | POA: Diagnosis not present

## 2024-01-23 DIAGNOSIS — R933 Abnormal findings on diagnostic imaging of other parts of digestive tract: Secondary | ICD-10-CM | POA: Diagnosis not present

## 2024-01-23 DIAGNOSIS — R1084 Generalized abdominal pain: Secondary | ICD-10-CM | POA: Diagnosis not present

## 2024-01-23 DIAGNOSIS — Z86718 Personal history of other venous thrombosis and embolism: Secondary | ICD-10-CM | POA: Diagnosis not present

## 2024-01-23 DIAGNOSIS — E7800 Pure hypercholesterolemia, unspecified: Secondary | ICD-10-CM | POA: Insufficient documentation

## 2024-01-23 DIAGNOSIS — J449 Chronic obstructive pulmonary disease, unspecified: Secondary | ICD-10-CM | POA: Diagnosis present

## 2024-01-23 HISTORY — PX: TOTAL KNEE ARTHROPLASTY: SHX125

## 2024-01-23 HISTORY — DX: Unilateral primary osteoarthritis, right knee: M17.11

## 2024-01-23 SURGERY — ARTHROPLASTY, KNEE, TOTAL
Anesthesia: Spinal | Site: Knee | Laterality: Right

## 2024-01-23 MED ORDER — BUPIVACAINE-EPINEPHRINE (PF) 0.5% -1:200000 IJ SOLN
INTRAMUSCULAR | Status: AC
  Filled 2024-01-23: qty 30

## 2024-01-23 MED ORDER — ACETAMINOPHEN 10 MG/ML IV SOLN
INTRAVENOUS | Status: AC
  Filled 2024-01-23: qty 100

## 2024-01-23 MED ORDER — BUPROPION HCL ER (SR) 100 MG PO TB12: 100.0000 mg | ORAL_TABLET | Freq: Every day | ORAL | Status: DC

## 2024-01-23 MED ORDER — PROPOFOL 500 MG/50ML IV EMUL
INTRAVENOUS | Status: DC | PRN
  Administered 2024-01-23: 50 ug/kg/min via INTRAVENOUS

## 2024-01-23 MED ORDER — TIZANIDINE HCL 2 MG PO TABS: 2.0000 mg | ORAL_TABLET | Freq: Four times a day (QID) | ORAL | 0 refills | Status: AC | PRN

## 2024-01-23 MED ORDER — PANTOPRAZOLE SODIUM 40 MG PO TBEC: 40.0000 mg | DELAYED_RELEASE_TABLET | Freq: Every day | ORAL | Status: DC

## 2024-01-23 MED ORDER — LIDOCAINE HCL (PF) 2 % IJ SOLN
INTRAMUSCULAR | Status: AC
  Filled 2024-01-23: qty 5

## 2024-01-23 MED ORDER — FENTANYL CITRATE (PF) 100 MCG/2ML IJ SOLN
INTRAMUSCULAR | Status: AC
  Filled 2024-01-23: qty 2

## 2024-01-23 MED ORDER — POLYETHYLENE GLYCOL 3350 17 G PO PACK: 17.0000 g | PACK | Freq: Every day | ORAL | Status: DC | PRN

## 2024-01-23 MED ORDER — LACTATED RINGERS IV BOLUS
500.0000 mL | Freq: Once | INTRAVENOUS | Status: AC
  Administered 2024-01-23: 500 mL via INTRAVENOUS

## 2024-01-23 MED ORDER — ACETAMINOPHEN 500 MG PO TABS: 1000.0000 mg | ORAL_TABLET | Freq: Four times a day (QID) | ORAL | Status: DC

## 2024-01-23 MED ORDER — ONDANSETRON HCL 4 MG/2ML IJ SOLN: 4.0000 mg | Freq: Four times a day (QID) | INTRAMUSCULAR | Status: DC | PRN

## 2024-01-23 MED ORDER — TRANEXAMIC ACID-NACL 1000-0.7 MG/100ML-% IV SOLN
1000.0000 mg | INTRAVENOUS | Status: AC
  Administered 2024-01-23: 1000 mg via INTRAVENOUS
  Filled 2024-01-23: qty 100

## 2024-01-23 MED ORDER — SODIUM CHLORIDE 0.9 % IR SOLN
Status: DC | PRN
  Administered 2024-01-23: 1000 mL

## 2024-01-23 MED ORDER — METHOCARBAMOL 1000 MG/10ML IJ SOLN: 500.0000 mg | Freq: Four times a day (QID) | INTRAMUSCULAR | Status: DC | PRN

## 2024-01-23 MED ORDER — HYDROMORPHONE HCL 2 MG PO TABS: 2.0000 mg | ORAL_TABLET | ORAL | Status: DC | PRN

## 2024-01-23 MED ORDER — 0.9 % SODIUM CHLORIDE (POUR BTL) OPTIME
TOPICAL | Status: DC | PRN
  Administered 2024-01-23: 1000 mL

## 2024-01-23 MED ORDER — METHOCARBAMOL 500 MG PO TABS
ORAL_TABLET | ORAL | Status: DC
Start: 2024-01-23 — End: 2024-01-23
  Filled 2024-01-23: qty 1

## 2024-01-23 MED ORDER — METOCLOPRAMIDE HCL 5 MG/ML IJ SOLN: 5.0000 mg | Freq: Three times a day (TID) | INTRAMUSCULAR | Status: DC | PRN

## 2024-01-23 MED ORDER — MEPERIDINE HCL 50 MG/ML IJ SOLN: 6.2500 mg | INTRAMUSCULAR | Status: DC | PRN

## 2024-01-23 MED ORDER — METOCLOPRAMIDE HCL 5 MG PO TABS: 5.0000 mg | ORAL_TABLET | Freq: Three times a day (TID) | ORAL | Status: DC | PRN

## 2024-01-23 MED ORDER — DIPHENHYDRAMINE HCL 12.5 MG/5ML PO ELIX: 12.5000 mg | ORAL_SOLUTION | ORAL | Status: DC | PRN

## 2024-01-23 MED ORDER — MENTHOL 3 MG MT LOZG: 1.0000 | LOZENGE | OROMUCOSAL | Status: DC | PRN

## 2024-01-23 MED ORDER — ALUM & MAG HYDROXIDE-SIMETH 200-200-20 MG/5ML PO SUSP
30.0000 mL | ORAL | Status: DC | PRN
Start: 2024-01-23 — End: 2024-01-23

## 2024-01-23 MED ORDER — ONDANSETRON HCL 4 MG/2ML IJ SOLN
INTRAMUSCULAR | Status: DC | PRN
  Administered 2024-01-23: 4 mg via INTRAVENOUS

## 2024-01-23 MED ORDER — METHOCARBAMOL 500 MG PO TABS
500.0000 mg | ORAL_TABLET | Freq: Four times a day (QID) | ORAL | Status: DC | PRN
  Administered 2024-01-23: 500 mg via ORAL

## 2024-01-23 MED ORDER — ACETAMINOPHEN 325 MG PO TABS: 325.0000 mg | ORAL_TABLET | Freq: Once | ORAL | Status: DC | PRN

## 2024-01-23 MED ORDER — ACETAMINOPHEN 325 MG PO TABS: 325.0000 mg | ORAL_TABLET | Freq: Four times a day (QID) | ORAL | Status: DC | PRN

## 2024-01-23 MED ORDER — TRANEXAMIC ACID-NACL 1000-0.7 MG/100ML-% IV SOLN: 1000.0000 mg | Freq: Once | INTRAVENOUS | Status: DC

## 2024-01-23 MED ORDER — BUPIVACAINE IN DEXTROSE 0.75-8.25 % IT SOLN
INTRATHECAL | Status: DC | PRN
Start: 2024-01-23 — End: 2024-01-23
  Administered 2024-01-23: 1.8 mL via INTRATHECAL

## 2024-01-23 MED ORDER — ONDANSETRON HCL 4 MG/2ML IJ SOLN
INTRAMUSCULAR | Status: AC
Start: 2024-01-23 — End: ?
  Filled 2024-01-23: qty 2

## 2024-01-23 MED ORDER — FENTANYL CITRATE (PF) 100 MCG/2ML IJ SOLN
INTRAMUSCULAR | Status: DC | PRN
  Administered 2024-01-23: 50 ug via INTRAVENOUS
  Administered 2024-01-23: 25 ug via INTRAVENOUS

## 2024-01-23 MED ORDER — PHENOL 1.4 % MT LIQD: 1.0000 | OROMUCOSAL | Status: DC | PRN

## 2024-01-23 MED ORDER — ROPIVACAINE HCL 5 MG/ML IJ SOLN
INTRAMUSCULAR | Status: DC | PRN
Start: 2024-01-23 — End: 2024-01-23
  Administered 2024-01-23: 30 mL via PERINEURAL

## 2024-01-23 MED ORDER — HYDROMORPHONE HCL 1 MG/ML IJ SOLN: 0.5000 mg | INTRAMUSCULAR | Status: DC | PRN

## 2024-01-23 MED ORDER — WATER FOR IRRIGATION, STERILE IR SOLN
Status: DC | PRN
  Administered 2024-01-23: 1000 mL

## 2024-01-23 MED ORDER — CEFAZOLIN SODIUM-DEXTROSE 2-4 GM/100ML-% IV SOLN
2.0000 g | INTRAVENOUS | Status: AC
  Administered 2024-01-23: 2 g via INTRAVENOUS
  Filled 2024-01-23: qty 100

## 2024-01-23 MED ORDER — DEXMEDETOMIDINE HCL IN NACL 80 MCG/20ML IV SOLN
INTRAVENOUS | Status: AC
  Filled 2024-01-23: qty 20

## 2024-01-23 MED ORDER — LACTATED RINGERS IV BOLUS
250.0000 mL | Freq: Once | INTRAVENOUS | Status: AC
  Administered 2024-01-23: 250 mL via INTRAVENOUS

## 2024-01-23 MED ORDER — ASPIRIN 81 MG PO CHEW: 81.0000 mg | CHEWABLE_TABLET | Freq: Two times a day (BID) | ORAL | Status: DC

## 2024-01-23 MED ORDER — POVIDONE-IODINE 10 % EX SWAB
2.0000 | Freq: Once | CUTANEOUS | Status: AC
  Administered 2024-01-23: 2 via TOPICAL

## 2024-01-23 MED ORDER — BUPIVACAINE LIPOSOME 1.3 % IJ SUSP
INTRAMUSCULAR | Status: AC
  Filled 2024-01-23: qty 20

## 2024-01-23 MED ORDER — KCL IN DEXTROSE-NACL 20-5-0.45 MEQ/L-%-% IV SOLN: INTRAVENOUS | Status: DC

## 2024-01-23 MED ORDER — ASPIRIN 81 MG PO TBEC: 81.0000 mg | DELAYED_RELEASE_TABLET | Freq: Two times a day (BID) | ORAL | 0 refills | Status: DC

## 2024-01-23 MED ORDER — LACTATED RINGERS IV SOLN: INTRAVENOUS | Status: DC

## 2024-01-23 MED ORDER — SODIUM CHLORIDE (PF) 0.9 % IJ SOLN
INTRAMUSCULAR | Status: AC
  Filled 2024-01-23: qty 50

## 2024-01-23 MED ORDER — DOCUSATE SODIUM 100 MG PO CAPS: 100.0000 mg | ORAL_CAPSULE | Freq: Two times a day (BID) | ORAL | Status: DC

## 2024-01-23 MED ORDER — DEXAMETHASONE SODIUM PHOSPHATE 10 MG/ML IJ SOLN
INTRAMUSCULAR | Status: DC | PRN
  Administered 2024-01-23: 5 mg via INTRAVENOUS

## 2024-01-23 MED ORDER — DEXMEDETOMIDINE HCL IN NACL 80 MCG/20ML IV SOLN
INTRAVENOUS | Status: DC | PRN
  Administered 2024-01-23 (×2): 4 ug via INTRAVENOUS

## 2024-01-23 MED ORDER — ORAL CARE MOUTH RINSE: 15.0000 mL | Freq: Once | OROMUCOSAL | Status: AC

## 2024-01-23 MED ORDER — PHENYLEPHRINE HCL-NACL 20-0.9 MG/250ML-% IV SOLN
INTRAVENOUS | Status: AC
Start: 2024-01-23 — End: 2024-01-23
  Filled 2024-01-23: qty 250

## 2024-01-23 MED ORDER — BUPIVACAINE LIPOSOME 1.3 % IJ SUSP: 20.0000 mL | Freq: Once | INTRAMUSCULAR | Status: DC

## 2024-01-23 MED ORDER — CHLORHEXIDINE GLUCONATE 0.12 % MT SOLN
15.0000 mL | Freq: Once | OROMUCOSAL | Status: AC
  Administered 2024-01-23: 15 mL via OROMUCOSAL

## 2024-01-23 MED ORDER — OXYCODONE HCL 5 MG PO TABS: 5.0000 mg | ORAL_TABLET | ORAL | 0 refills | Status: AC | PRN

## 2024-01-23 MED ORDER — SODIUM CHLORIDE (PF) 0.9 % IJ SOLN
INTRAMUSCULAR | Status: DC | PRN
  Administered 2024-01-23: 100 mL

## 2024-01-23 MED ORDER — DEXAMETHASONE SODIUM PHOSPHATE 10 MG/ML IJ SOLN
INTRAMUSCULAR | Status: AC
  Filled 2024-01-23: qty 1

## 2024-01-23 MED ORDER — PROPOFOL 1000 MG/100ML IV EMUL
INTRAVENOUS | Status: AC
  Filled 2024-01-23: qty 100

## 2024-01-23 MED ORDER — FLEET ENEMA RE ENEM: 1.0000 | ENEMA | Freq: Once | RECTAL | Status: DC | PRN

## 2024-01-23 MED ORDER — BISACODYL 5 MG PO TBEC: 5.0000 mg | DELAYED_RELEASE_TABLET | Freq: Every day | ORAL | Status: DC | PRN

## 2024-01-23 MED ORDER — ONDANSETRON HCL 4 MG PO TABS: 4.0000 mg | ORAL_TABLET | Freq: Four times a day (QID) | ORAL | Status: DC | PRN

## 2024-01-23 MED ORDER — ACETAMINOPHEN 160 MG/5ML PO SOLN: 325.0000 mg | Freq: Once | ORAL | Status: DC | PRN

## 2024-01-23 MED ORDER — LIDOCAINE HCL (CARDIAC) PF 100 MG/5ML IV SOSY
PREFILLED_SYRINGE | INTRAVENOUS | Status: DC | PRN
  Administered 2024-01-23: 40 mg via INTRAVENOUS

## 2024-01-23 MED ORDER — LACTATED RINGERS IV BOLUS: 250.0000 mL | Freq: Once | INTRAVENOUS | Status: DC

## 2024-01-23 MED ORDER — ACETAMINOPHEN 10 MG/ML IV SOLN
1000.0000 mg | Freq: Once | INTRAVENOUS | Status: DC | PRN
  Administered 2024-01-23: 1000 mg via INTRAVENOUS

## 2024-01-23 MED ORDER — PHENYLEPHRINE HCL-NACL 20-0.9 MG/250ML-% IV SOLN
INTRAVENOUS | Status: DC | PRN
  Administered 2024-01-23: 30 ug/min via INTRAVENOUS

## 2024-01-23 MED ORDER — DROPERIDOL 2.5 MG/ML IJ SOLN: 0.6250 mg | Freq: Once | INTRAMUSCULAR | Status: DC | PRN

## 2024-01-23 MED ORDER — HYDROMORPHONE HCL 1 MG/ML IJ SOLN
0.2500 mg | INTRAMUSCULAR | Status: DC | PRN
Start: 2024-01-23 — End: 2024-01-23

## 2024-01-23 MED ORDER — HYDROMORPHONE HCL 2 MG PO TABS: 1.0000 mg | ORAL_TABLET | ORAL | Status: DC | PRN

## 2024-01-23 SURGICAL SUPPLY — 50 items
ATTUNE MED DOME PAT 41 KNEE (Knees) IMPLANT
ATTUNE PS FEM RT SZ 6 CEM KNEE (Femur) IMPLANT
BAG COUNTER SPONGE SURGICOUNT (BAG) IMPLANT
BAG ZIPLOCK 12X15 (MISCELLANEOUS) ×1 IMPLANT
BASE TIBIAL ROT PLAT SZ 7 KNEE (Knees) IMPLANT
BENZOIN TINCTURE PRP APPL 2/3 (GAUZE/BANDAGES/DRESSINGS) ×1 IMPLANT
BLADE SAGITTAL 25.0X1.19X90 (BLADE) ×1 IMPLANT
BLADE SAW SGTL 13.0X1.19X90.0M (BLADE) ×1 IMPLANT
BLADE SURG SZ10 CARB STEEL (BLADE) ×2 IMPLANT
BNDG ELASTIC 6INX 5YD STR LF (GAUZE/BANDAGES/DRESSINGS) ×1 IMPLANT
BOOTIES KNEE HIGH SLOAN (MISCELLANEOUS) ×1 IMPLANT
BOWL SMART MIX CTS (DISPOSABLE) ×1 IMPLANT
CEMENT HV SMART SET (Cement) ×2 IMPLANT
CLSR STERI-STRIP ANTIMIC 1/2X4 (GAUZE/BANDAGES/DRESSINGS) IMPLANT
COVER SURGICAL LIGHT HANDLE (MISCELLANEOUS) ×1 IMPLANT
CUFF TRNQT CYL 34X4.125X (TOURNIQUET CUFF) ×1 IMPLANT
DRAPE INCISE IOBAN 66X45 STRL (DRAPES) ×1 IMPLANT
DRAPE U-SHAPE 47X51 STRL (DRAPES) ×1 IMPLANT
DRSG AQUACEL AG ADV 3.5X10 (GAUZE/BANDAGES/DRESSINGS) ×1 IMPLANT
DURAPREP 26ML APPLICATOR (WOUND CARE) ×1 IMPLANT
ELECT REM PT RETURN 15FT ADLT (MISCELLANEOUS) ×1 IMPLANT
GLOVE BIOGEL PI IND STRL 8 (GLOVE) ×2 IMPLANT
GLOVE ECLIPSE 7.5 STRL STRAW (GLOVE) ×2 IMPLANT
GOWN STRL REUS W/ TWL XL LVL3 (GOWN DISPOSABLE) ×2 IMPLANT
HOLDER FOLEY CATH W/STRAP (MISCELLANEOUS) IMPLANT
HOOD PEEL AWAY T7 (MISCELLANEOUS) ×3 IMPLANT
INSERT KNEE ATTUNE SZ6 14MM (Insert) IMPLANT
KIT TURNOVER KIT A (KITS) IMPLANT
MANIFOLD NEPTUNE II (INSTRUMENTS) ×1 IMPLANT
NDL HYPO 22X1.5 SAFETY MO (MISCELLANEOUS) ×2 IMPLANT
NEEDLE HYPO 22X1.5 SAFETY MO (MISCELLANEOUS) ×2 IMPLANT
NS IRRIG 1000ML POUR BTL (IV SOLUTION) ×1 IMPLANT
PACK TOTAL KNEE CUSTOM (KITS) ×1 IMPLANT
PADDING CAST COTTON 6X4 STRL (CAST SUPPLIES) ×1 IMPLANT
PIN STEINMAN FIXATION KNEE (PIN) IMPLANT
PROTECTOR NERVE ULNAR (MISCELLANEOUS) ×1 IMPLANT
SET HNDPC FAN SPRY TIP SCT (DISPOSABLE) ×1 IMPLANT
SPIKE FLUID TRANSFER (MISCELLANEOUS) ×2 IMPLANT
STRIP CLOSURE SKIN 1/2X4 (GAUZE/BANDAGES/DRESSINGS) IMPLANT
SUT MNCRL AB 3-0 PS2 18 (SUTURE) ×1 IMPLANT
SUT VIC AB 0 CT1 36 (SUTURE) ×1 IMPLANT
SUT VIC AB 1 CT1 36 (SUTURE) ×2 IMPLANT
SUT VIC AB 2-0 CT1 TAPERPNT 27 (SUTURE) ×1 IMPLANT
SUT VICRYL+ 3-0 36IN CT-1 (SUTURE) IMPLANT
SYR CONTROL 10ML LL (SYRINGE) ×2 IMPLANT
TIBIAL BASE ROT PLAT SZ 7 KNEE (Knees) ×1 IMPLANT
TRAY CATH INTERMITTENT SS 16FR (CATHETERS) IMPLANT
TUBE SUCTION HIGH CAP CLEAR NV (SUCTIONS) ×1 IMPLANT
WATER STERILE IRR 1000ML POUR (IV SOLUTION) ×2 IMPLANT
WRAP KNEE MAXI GEL POST OP (GAUZE/BANDAGES/DRESSINGS) ×1 IMPLANT

## 2024-01-23 NOTE — Discharge Instructions (Addendum)

## 2024-01-23 NOTE — Anesthesia Procedure Notes (Signed)
Anesthesia Regional Block: Adductor canal block   Pre-Anesthetic Checklist: , timeout performed,  Correct Patient, Correct Site, Correct Laterality,  Correct Procedure, Correct Position, site marked,  Risks and benefits discussed,  Surgical consent,  Pre-op evaluation,  At surgeon's request and post-op pain management  Laterality: Right  Prep: chloraprep       Needles:  Injection technique: Single-shot  Needle Type: Echogenic Stimulator Needle     Needle Length: 9cm  Needle Gauge: 21     Additional Needles:   Procedures:,,,, ultrasound used (permanent image in chart),,    Narrative:  Start time: 01/23/2024 7:00 AM End time: 01/23/2024 7:05 AM Injection made incrementally with aspirations every 5 mL.  Performed by: Personally  Anesthesiologist: Shelton Silvas, MD  Additional Notes: Discussed risks and benefits of the nerve block in detail, including but not limited vascular injury, permanent nerve damage and infection.   Patient tolerated the procedure well. Local anesthetic introduced in an incremental fashion under minimal resistance after negative aspirations. No paresthesias were elicited. After completion of the procedure, no acute issues were identified and patient continued to be monitored by RN.

## 2024-01-23 NOTE — Telephone Encounter (Signed)
Pharmacy Patient Advocate Encounter  Received notification from [employer] that Prior Authorization for Repatha has been APPROVED from 01/23/24 to 01/22/25. Ran test claim, Copay is $32.00- one month. This test claim was processed through [facility]- copay amounts may vary at other pharmacies due to pharmacy/plan contracts, or as the patient moves through the different stages of their insurance plan.   PA #/Case ID/Reference #: [id number]

## 2024-01-23 NOTE — Transfer of Care (Signed)
Immediate Anesthesia Transfer of Care Note  Patient: James Tate  Procedure(s) Performed: RIGHT TOTAL KNEE ARTHROPLASTY (Right: Knee)  Patient Location: PACU  Anesthesia Type:Spinal  Level of Consciousness: awake, alert , oriented, and patient cooperative  Airway & Oxygen Therapy: Patient Spontanous Breathing and Patient connected to face mask oxygen  Post-op Assessment: Report given to RN and Post -op Vital signs reviewed and stable  Post vital signs: Reviewed and stable  Last Vitals:  Vitals Value Taken Time  BP 100/57 01/23/24 0921  Temp    Pulse 66 01/23/24 0924  Resp 17 01/23/24 0924  SpO2 100 % 01/23/24 0924  Vitals shown include unfiled device data.  Last Pain:  Vitals:   01/23/24 0642  TempSrc:   PainSc: 0-No pain         Complications: No notable events documented.

## 2024-01-23 NOTE — Progress Notes (Signed)
Orthopedic Tech Progress Note Patient Details:  James Tate [birth year] [id number]  Ortho Devices Type of Ortho Device: Bone foam zero knee Ortho Device/Splint Interventions: Ordered     Patient still in the OR, bonefoam dropped off at bay 4 in PACU. [name] 01/23/2024, 9:22 AM

## 2024-01-23 NOTE — Op Note (Signed)
PATIENT ID:      James Tate  MRN:     [id number] DOB/AGE:    November 11, 1946 / 78 y.o.       OPERATIVE REPORT   DATE OF PROCEDURE:  01/23/2024      PREOPERATIVE DIAGNOSIS:   RIGHT KNEE OSTEOARTHRITIS      Estimated body mass index is 25.63 kg/m as calculated from the following:   Height as of 01/10/24: 6' (1.829 m).   Weight as of this encounter: 85.7 kg.                                                       POSTOPERATIVE DIAGNOSIS:   Same                                                                  PROCEDURE:  Procedure(s): RIGHT TOTAL KNEE ARTHROPLASTY Using DepuyAttune RP implants #6 Femur, #7Tibia, 14 mm Attune RP bearing, 41 Patella    SURGEON: Harvie Junior  ASSISTANT:   Tomi Likens. [employer]   (Present and scrubbed throughout the case, critical for assistance with exposure, retraction, instrumentation, and closure.)        ANESTHESIA: spinal, 20cc Exparel, 50cc 0.25% Marcaine EBL: min cc FLUID REPLACEMENT: unk cc crystaloid TOURNIQUET: 45 min DRAINS: None TRANEXAMIC ACID: 1gm IV, 2gm topical COMPLICATIONS:  None         INDICATIONS FOR PROCEDURE: The patient has  RIGHT KNEE OSTEOARTHRITIS, varus deformities, XR shows bone on bone arthritis, lateral subluxation of tibia. Patient has failed all conservative measures including anti-inflammatory medicines, narcotics, attempts at exercise and weight loss, cortisone injections and viscosupplementation.  Risks and benefits of surgery have been discussed, questions answered.   DESCRIPTION OF PROCEDURE: The patient identified by armband, received  IV antibiotics, in the holding area at [facility]. Patient taken to the operating room, appropriate anesthetic monitors were attached, and spinal anesthesia was  induced. IV Tranexamic acid was given. Lateral post and 2 surefoot positioners applied to the table, the lower extremity was then prepped and draped in usual sterile fashion from the toes to the high thigh. Time-out  procedure was performed. Tomi Likens. [facility] PAC, was present and scrubbed throughout the case, critical for assistance with, positioning, exposure, retraction, instrumentation, and closure.The skin and subcutaneous tissue along the incision was injected with 20 cc of a mixture of 20cc Exparel and 30cc Marcaine 50cc saline solution, using a 21-gauge by 1-1/2 inch needle. We began the operation, with the knee flexed 130 degrees, by making the anterior midline incision starting at handbreadth above the patella going over the patella 1 cm medial to and 4 cm distal to the tibial tubercle. Small bleeders in the skin and the subcutaneous tissue identified and cauterized. Transverse retinaculum was incised and reflected medially and a medial parapatellar arthrotomy was accomplished. the patella was everted and theprepatellar fat pad resected. The superficial medial collateral ligament was then elevated from anterior to posterior along the proximal flare of the tibia and anterior half of the menisci resected. The knee was hyperflexed exposing bone on bone  arthritis. Peripheral and notch osteophytes as well as the cruciate ligaments were then resected. We continued to work our way around posteriorly along the proximal tibia, and externally rotated the tibia subluxing it out from underneath the femur. A McHale PCL retractor was placed through the notch, a lateral Hohmann retractor, and anterolateral small homan retractor placed. We then entered the proximal tibia with the Depuy starter drill in line with the axis of the tibia followed by an intramedullary guide rod and 3-degree posterior slope cutting guide. The tibial cutting guide, was pinned into place allowing resection of 2 mm of bone medially and 16 mm of bone laterally. Satisfied with the tibial resection, we then entered the distal femur 2 mm anterior to the PCL origin with the starter drill, followed by the intramedullary guide rod and applied the distal femoral cutting  guide set at 9 mm, with 5 degrees of valgus. This was pinned along the epicondylar axis. At this point, the distal femoral cut was accomplished without difficulty. We then sized for a #6 femoral component and pinned the chamfer guide in 3 degrees of external rotation. The anterior, posterior, and chamfer cuts were accomplished without difficulty followed by the Attune RP box cutting guide and the box cut. We also removed posterior osteophytes from the posterior femoral condyles. The posterior capsule was injected with Exparel solution. The knee was brought into full extension. We checked our extension gap and fit a 14 mm trial lollipop. Distracting in extension with a lamina spreader,  bleeders in the posterior capsule, Posterior medial and posterior lateral gutter were cauterized.  The transexamic acid-soaked sponge was then placed in the gap of the knee in extension. The knee was flexed 30. The posterior patella cut was accomplished with the 9.5 mm Attune cutting guide, sized for a 41 mm dome, and the fixation pegs drilled.The knee was then once again hyperflexed exposing the proximal tibia. We sized for a # 7 tibial base plate, applied the smokestack and the conical reamer followed by the the Delta fin keel punch. We then hammered into place the Attune RP trial femoral component, drilled the lugs, inserted a  14  mm trial bearing, trial patellar button, and took the knee through range of motion from 0-130 degrees. Medial and lateral ligamentous stability was checked. No thumb pressure was required for patellar Tracking.  All trial components were removed, mating surfaces irrigated with pulse lavage, and dried with suction and sponges. 10 cc of the Exparel solution was applied to the cancellus bone of the patella distal femur and proximal tibia.  After waiting 30 seconds, the bony surfaces were again, dried with sponges. A double batch of DePuy HV cement was mixed and applied to all bony metallic mating surfaces  except for the posterior condyles of the femur itself. In order, we hammered into place the tibial tray and removed excess cement, the femoral component and removed excess cement. The final Attune RP bearing was inserted, and the knee brought to full extension with compression. The patellar button was clamped into place, and excess cement removed. The knee was held at 30 flexion with compression using the second surefoot, while the cement cured. The wound was irrigated out with normal saline solution pulse lavage. The rest of the Exparel was injected into the parapatellar arthrotomy, subcutaneous tissues, and periosteal tissues. The parapatellar arthrotomy was closed with running #1 Vicryl suture. The subcutaneous tissue with 3-0 undyed Vicryl suture, and the skin with running 3-0 SQ vicryl. An Aquacil dressing  and Ace wrap were applied. The patient was taken to recovery room without difficulty.   Harvie Junior 01/23/2024, 12:08 PM

## 2024-01-23 NOTE — Evaluation (Signed)
Physical Therapy Evaluation Patient Details Name: James Tate MRN: [id number] DOB: 1946/12/02 Today's Date: 01/23/2024  History of Present Illness  78 yo male presents to therapy s/p R TKA on 01/23/2024 due to failure of conservative measures. Pt PMH includes but is not limited to: amnesia, HOH, HLD, OSA on OSA, TIA, prostate ca, and L TKA (2020).  Clinical Impression   GUNTER CONDE is a 78 y.o. male POD 0 s/p R TKA. Patient reports mod I with mobility at baseline. Patient is now limited by functional impairments (see PT problem list below) and requires CGA and cues for transfers and gait with RW. Patient was able to ambulate 50 and 30 feet with RW and CGA and cues for safe walker management. Patient educated on safe sequencing for stair mobility, fall risk prevention, slowly increasing activity levels, pain management and goal, use of CP/ice and car transfers pt  and daughter verbalized understanding of safe guarding position for people assisting with mobility. Patient instructed in exercises to facilitate ROM and circulation reviewed and HO provided. Patient will benefit from continued skilled PT interventions to address impairments and progress towards PLOF. Patient has met mobility goals at adequate level for discharge home with family support and OPPT services scheduled for 2/20; will continue to follow if pt continues acute stay to progress towards Mod I goals.       If plan is discharge home, recommend the following: A little help with walking and/or transfers;A little help with bathing/dressing/bathroom;Assistance with cooking/housework;Assist for transportation;Help with stairs or ramp for entrance   Can travel by private vehicle        Equipment Recommendations None recommended by PT  Recommendations for Other Services       Functional Status Assessment Patient has had a recent decline in their functional status and demonstrates the ability to make significant improvements  in function in a reasonable and predictable amount of time.     Precautions / Restrictions Precautions Precautions: Knee;Fall Restrictions Weight Bearing Restrictions Per Provider Order: No      Mobility  Bed Mobility Overal bed mobility: Needs Assistance Bed Mobility: Supine to Sit     Supine to sit: Supervision, HOB elevated, Used rails     General bed mobility comments: min cues    Transfers Overall transfer level: Needs assistance Equipment used: Rolling walker (2 wheels) Transfers: Sit to/from Stand Sit to Stand: Contact guard assist           General transfer comment: min cues for proper UE and AD palcement with bed, recliner and commode transfer    Ambulation/Gait Ambulation/Gait assistance: Contact guard assist Gait Distance (Feet): 50 Feet Assistive device: Rolling walker (2 wheels) Gait Pattern/deviations: Step-to pattern, Trunk flexed, Antalgic Gait velocity: decreased     General Gait Details: B UE support at RW to offload R LE in stance phase, step to pattern with slight trunk flexion and head forward  Stairs Stairs: Yes Stairs assistance: Contact guard assist Stair Management: Two rails Number of Stairs: 2 General stair comments: min cues for safety and technique pt able to recall proper sequencing  Wheelchair Mobility     Tilt Bed    Modified Rankin (Stroke Patients Only)       Balance Overall balance assessment: Needs assistance Sitting-balance support: Feet supported Sitting balance-Leahy Scale: Good     Standing balance support: Bilateral upper extremity supported, During functional activity, Reliant on assistive device for balance Standing balance-Leahy Scale: Fair Standing balance comment: static standing no UE support  Pertinent Vitals/Pain Pain Assessment Pain Assessment: 0-10 Pain Score: 5  Pain Location: R knee and LE Pain Descriptors / Indicators: Aching, Constant, Discomfort,  Dull, Operative site guarding Pain Intervention(s): Limited activity within patient's tolerance, Monitored during session, Premedicated before session, Repositioned, Ice applied    Home Living Family/patient expects to be discharged to:: Private residence Living Arrangements: Spouse/significant other Available Help at Discharge: Family (2 daughters will be able to assist in addition to spouse) Type of Home: House Home Access: Stairs to enter Entrance Stairs-Rails: Right;Left;Can reach both Entrance Stairs-Number of Steps: 4   Home Layout: One level Home Equipment: Educational psychologist (2 wheels);Cane - single point      Prior Function Prior Level of Function : Independent/Modified Independent             Mobility Comments: IND with occational use of SPC for community navigaiton, IND for all ADLs, self care tasks and IADLs.       Extremity/Trunk Assessment        Lower Extremity Assessment Lower Extremity Assessment: RLE deficits/detail RLE Deficits / Details: ankle DF/PF 5/5;SLR < 10 degree lag RLE Sensation: WNL (pt indicated mild abn sensation R plantar surface of foot once in standing)    Cervical / Trunk Assessment Cervical / Trunk Assessment:  (head forward)  Communication   Communication Communication: No apparent difficulties    Cognition Arousal: Alert Behavior During Therapy: WFL for tasks assessed/performed   PT - Cognitive impairments: No apparent impairments                         Following commands: Intact       Cueing       General Comments      Exercises Total Joint Exercises Ankle Circles/Pumps: AROM, 5 reps, 20 reps Quad Sets: AROM, Right, 5 reps Short Arc Quad: AROM, Right, 5 reps Heel Slides: AROM, Right, 5 reps Hip ABduction/ADduction: AROM, Right, 5 reps Straight Leg Raises: AROM, Right, 5 reps Knee Flexion: AROM, Right, 5 reps, Seated   Assessment/Plan    PT Assessment Patient needs continued PT services   PT Problem List Decreased strength;Decreased range of motion;Decreased activity tolerance;Decreased balance;Decreased mobility;Pain       PT Treatment Interventions DME instruction;Gait training;Stair training;Functional mobility training;Therapeutic exercise;Therapeutic activities;Balance training;Modalities;Neuromuscular re-education;Patient/family education    PT Goals (Current goals can be found in the Care Plan section)  Acute Rehab PT Goals Patient Stated Goal: navigate steps more easily, yard work including digging holes and do every thing I was doing before. PT Goal Formulation: With patient Time For Goal Achievement: 02/06/24 Potential to Achieve Goals: Good    Frequency 7X/week     Co-evaluation               AM-PAC PT "6 Clicks" Mobility  Outcome Measure Help needed turning from your back to your side while in a flat bed without using bedrails?: None Help needed moving from lying on your back to sitting on the side of a flat bed without using bedrails?: A Little Help needed moving to and from a bed to a chair (including a wheelchair)?: A Little Help needed standing up from a chair using your arms (e.g., wheelchair or bedside chair)?: A Little Help needed to walk in hospital room?: A Little Help needed climbing 3-5 steps with a railing? : A Little 6 Click Score: 19    End of Session Equipment Utilized During Treatment: Gait belt Activity Tolerance: Patient tolerated treatment well (  mild increase in pain 3/10 to 5/10 with mobility) Patient left: in chair;with call bell/phone within reach;with family/visitor present Nurse Communication: Mobility status;Other (comment) (pt readiness for d/c from PT standpoint) PT Visit Diagnosis: Unsteadiness on feet (R26.81);Other abnormalities of gait and mobility (R26.89);Muscle weakness (generalized) (M62.81);Difficulty in walking, not elsewhere classified (R26.2);Pain Pain - Right/Left: Right Pain - part of body: Leg;Knee     Time: 9147-8295 PT Time Calculation (min) (ACUTE ONLY): 54 min   Charges:   PT Evaluation $PT Eval Low Complexity: 1 Low PT Treatments $Gait Training: 8-22 mins $Therapeutic Exercise: 8-22 mins $Therapeutic Activity: 8-22 mins PT General Charges $$ ACUTE PT VISIT: 1 Visit         Johnny Bridge, PT Acute Rehab   Jacqualyn Posey 01/23/2024, 1:01 PM

## 2024-01-23 NOTE — Anesthesia Postprocedure Evaluation (Signed)
Anesthesia Post Note  Patient: James Tate  Procedure(s) Performed: RIGHT TOTAL KNEE ARTHROPLASTY (Right: Knee)     Patient location during evaluation: PACU Anesthesia Type: Spinal Level of consciousness: oriented and awake and alert Pain management: pain level controlled Vital Signs Assessment: post-procedure vital signs reviewed and stable Respiratory status: spontaneous breathing, respiratory function stable and patient connected to nasal cannula oxygen Cardiovascular status: blood pressure returned to baseline and stable Postop Assessment: no headache, no backache, no apparent nausea or vomiting and spinal receding Anesthetic complications: no  No notable events documented.  Last Vitals:  Vitals:   01/23/24 1020 01/23/24 1040  BP: (!) 139/90 120/75  Pulse: 79 73  Resp: 17 14  Temp:  36.6 C  SpO2: 95% 99%    Last Pain:  Vitals:   01/23/24 1040  TempSrc:   PainSc: 1                  Shelton Silvas

## 2024-01-23 NOTE — Telephone Encounter (Signed)
Pharmacy Patient Advocate Encounter   Received notification from Fax that prior authorization for repatha is required/requested.   Insurance verification completed.   The patient is insured through [employer] .   Per test claim: PA required; PA submitted to above mentioned insurance via CoverMyMeds Key/confirmation #/EOC [id number] Status is pending

## 2024-01-23 NOTE — Interval H&P Note (Signed)
History and Physical Interval Note:  01/23/2024 7:15 AM  James Tate  has presented today for surgery, with the diagnosis of RIGHT KNEE OSTEOARTHRITIS.  The various methods of treatment have been discussed with the patient and family. After consideration of risks, benefits and other options for treatment, the patient has consented to  Procedure(s): TOTAL KNEE ARTHROPLASTY (Right) as a surgical intervention.  The patient's history has been reviewed, patient examined, no change in status, stable for surgery.  I have reviewed the patient's chart and labs.  Questions were answered to the patient's satisfaction.     Harvie Junior

## 2024-01-23 NOTE — Anesthesia Procedure Notes (Signed)
Spinal  Start time: 01/23/2024 7:26 AM End time: 01/23/2024 7:28 AM Reason for block: surgical anesthesia Staffing Performed: anesthesiologist  Anesthesiologist: Shelton Silvas, MD Performed by: Shelton Silvas, MD Authorized by: Shelton Silvas, MD   Preanesthetic Checklist Completed: patient identified, IV checked, site marked, risks and benefits discussed, surgical consent, monitors and equipment checked, pre-op evaluation and timeout performed Spinal Block Patient position: sitting Prep: DuraPrep and site prepped and draped Location: L3-4 Injection technique: single-shot Needle Needle type: Pencan  Needle gauge: 24 G Needle length: 10 cm Needle insertion depth: 10 cm Additional Notes Patient tolerated well. No immediate complications.  Functioning IV was confirmed and monitors were applied. Sterile prep and drape, including hand hygiene and sterile gloves were used. The patient was positioned and the back was prepped. The skin was anesthetized with lidocaine. Free flow of clear CSF was obtained prior to injecting local anesthetic into the CSF. The spinal needle aspirated freely following injection. The needle was carefully withdrawn. The patient tolerated the procedure well.

## 2024-01-24 ENCOUNTER — Encounter (HOSPITAL_COMMUNITY): Payer: Self-pay | Admitting: Orthopedic Surgery

## 2024-01-26 ENCOUNTER — Inpatient Hospital Stay (HOSPITAL_COMMUNITY): Payer: Medicare Other

## 2024-01-26 ENCOUNTER — Emergency Department (HOSPITAL_COMMUNITY): Payer: Medicare Other

## 2024-01-26 ENCOUNTER — Other Ambulatory Visit: Payer: Self-pay

## 2024-01-26 ENCOUNTER — Inpatient Hospital Stay (HOSPITAL_COMMUNITY)
Admission: EM | Admit: 2024-01-26 | Discharge: 2024-02-01 | DRG: 982 | Disposition: A | Discharge status: Home / Self Care | Payer: Medicare Other | Attending: Internal Medicine | Admitting: Internal Medicine

## 2024-01-26 ENCOUNTER — Encounter (HOSPITAL_COMMUNITY): Payer: Self-pay | Admitting: Internal Medicine

## 2024-01-26 DIAGNOSIS — K567 Ileus, unspecified: Secondary | ICD-10-CM

## 2024-01-26 DIAGNOSIS — Z7982 Long term (current) use of aspirin: Secondary | ICD-10-CM

## 2024-01-26 DIAGNOSIS — H409 Unspecified glaucoma: Secondary | ICD-10-CM | POA: Diagnosis present

## 2024-01-26 DIAGNOSIS — I2699 Other pulmonary embolism without acute cor pulmonale: Principal | ICD-10-CM | POA: Diagnosis present

## 2024-01-26 DIAGNOSIS — Q2112 Patent foramen ovale: Secondary | ICD-10-CM | POA: Diagnosis not present

## 2024-01-26 DIAGNOSIS — Z8673 Personal history of transient ischemic attack (TIA), 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 residual deficits: Secondary | ICD-10-CM

## 2024-01-26 DIAGNOSIS — E7800 Pure hypercholesterolemia, unspecified: Secondary | ICD-10-CM | POA: Diagnosis present

## 2024-01-26 DIAGNOSIS — E876 Hypokalemia: Secondary | ICD-10-CM | POA: Diagnosis present

## 2024-01-26 DIAGNOSIS — J449 Chronic obstructive pulmonary disease, unspecified: Secondary | ICD-10-CM | POA: Diagnosis present

## 2024-01-26 DIAGNOSIS — Z86718 Personal history of other venous thrombosis and embolism: Secondary | ICD-10-CM | POA: Diagnosis not present

## 2024-01-26 DIAGNOSIS — R110 Nausea: Secondary | ICD-10-CM | POA: Diagnosis not present

## 2024-01-26 DIAGNOSIS — Z96652 Presence of left artificial knee joint: Secondary | ICD-10-CM | POA: Diagnosis present

## 2024-01-26 DIAGNOSIS — Z9049 Acquired absence of other specified parts of digestive tract: Secondary | ICD-10-CM

## 2024-01-26 DIAGNOSIS — G4733 Obstructive sleep apnea (adult) (pediatric): Secondary | ICD-10-CM | POA: Diagnosis present

## 2024-01-26 DIAGNOSIS — Z79899 Other long term (current) drug therapy: Secondary | ICD-10-CM

## 2024-01-26 DIAGNOSIS — R627 Adult failure to thrive: Secondary | ICD-10-CM | POA: Diagnosis present

## 2024-01-26 DIAGNOSIS — N400 Enlarged prostate without lower urinary tract symptoms: Secondary | ICD-10-CM | POA: Diagnosis present

## 2024-01-26 DIAGNOSIS — Z7901 Long term (current) use of anticoagulants: Secondary | ICD-10-CM

## 2024-01-26 DIAGNOSIS — F419 Anxiety disorder, unspecified: Secondary | ICD-10-CM | POA: Diagnosis present

## 2024-01-26 DIAGNOSIS — R0602 Shortness of breath: Secondary | ICD-10-CM | POA: Diagnosis present

## 2024-01-26 DIAGNOSIS — M1711 Unilateral primary osteoarthritis, right knee: Secondary | ICD-10-CM | POA: Diagnosis present

## 2024-01-26 DIAGNOSIS — E871 Hypo-osmolality and hyponatremia: Secondary | ICD-10-CM | POA: Diagnosis present

## 2024-01-26 DIAGNOSIS — I2510 Atherosclerotic heart disease of native coronary artery without angina pectoris: Secondary | ICD-10-CM | POA: Diagnosis present

## 2024-01-26 DIAGNOSIS — Z85820 Personal history of malignant melanoma of skin: Secondary | ICD-10-CM | POA: Diagnosis not present

## 2024-01-26 DIAGNOSIS — H903 Sensorineural hearing loss, bilateral: Secondary | ICD-10-CM | POA: Diagnosis present

## 2024-01-26 DIAGNOSIS — R509 Fever, unspecified: Secondary | ICD-10-CM | POA: Diagnosis not present

## 2024-01-26 DIAGNOSIS — M7989 Other specified soft tissue disorders: Secondary | ICD-10-CM | POA: Diagnosis not present

## 2024-01-26 DIAGNOSIS — K9189 Other postprocedural complications and disorders of digestive system: Secondary | ICD-10-CM | POA: Diagnosis not present

## 2024-01-26 DIAGNOSIS — Z8546 Personal history of malignant neoplasm of prostate: Secondary | ICD-10-CM | POA: Diagnosis not present

## 2024-01-26 DIAGNOSIS — Z888 Allergy status to other drugs, medicaments and biological substances status: Secondary | ICD-10-CM

## 2024-01-26 DIAGNOSIS — I639 Cerebral infarction, unspecified: Secondary | ICD-10-CM | POA: Diagnosis present

## 2024-01-26 DIAGNOSIS — I700 Atherosclerosis of aorta: Secondary | ICD-10-CM | POA: Diagnosis present

## 2024-01-26 DIAGNOSIS — D62 Acute posthemorrhagic anemia: Secondary | ICD-10-CM | POA: Diagnosis not present

## 2024-01-26 DIAGNOSIS — I340 Nonrheumatic mitral (valve) insufficiency: Secondary | ICD-10-CM | POA: Diagnosis present

## 2024-01-26 DIAGNOSIS — R1084 Generalized abdominal pain: Secondary | ICD-10-CM | POA: Diagnosis not present

## 2024-01-26 DIAGNOSIS — C61 Malignant neoplasm of prostate: Secondary | ICD-10-CM | POA: Diagnosis present

## 2024-01-26 DIAGNOSIS — K5900 Constipation, unspecified: Secondary | ICD-10-CM | POA: Diagnosis not present

## 2024-01-26 DIAGNOSIS — Z881 Allergy status to other antibiotic agents status: Secondary | ICD-10-CM

## 2024-01-26 DIAGNOSIS — Z923 Personal history of irradiation: Secondary | ICD-10-CM

## 2024-01-26 DIAGNOSIS — Z1152 Encounter for screening for COVID-19: Secondary | ICD-10-CM

## 2024-01-26 DIAGNOSIS — E869 Volume depletion, unspecified: Secondary | ICD-10-CM | POA: Diagnosis not present

## 2024-01-26 DIAGNOSIS — Z87891 Personal history of nicotine dependence: Secondary | ICD-10-CM

## 2024-01-26 DIAGNOSIS — Z8262 Family history of osteoporosis: Secondary | ICD-10-CM

## 2024-01-26 DIAGNOSIS — I341 Nonrheumatic mitral (valve) prolapse: Secondary | ICD-10-CM | POA: Diagnosis present

## 2024-01-26 DIAGNOSIS — I2609 Other pulmonary embolism with acute cor pulmonale: Secondary | ICD-10-CM | POA: Diagnosis not present

## 2024-01-26 DIAGNOSIS — M1712 Unilateral primary osteoarthritis, left knee: Secondary | ICD-10-CM | POA: Diagnosis present

## 2024-01-26 DIAGNOSIS — Z885 Allergy status to narcotic agent status: Secondary | ICD-10-CM

## 2024-01-26 DIAGNOSIS — R933 Abnormal findings on diagnostic imaging of other parts of digestive tract: Secondary | ICD-10-CM | POA: Diagnosis not present

## 2024-01-26 DIAGNOSIS — R112 Nausea with vomiting, unspecified: Secondary | ICD-10-CM | POA: Diagnosis not present

## 2024-01-26 DIAGNOSIS — M8580 Other specified disorders of bone density and structure, unspecified site: Secondary | ICD-10-CM | POA: Diagnosis present

## 2024-01-26 DIAGNOSIS — H9190 Unspecified hearing loss, unspecified ear: Secondary | ICD-10-CM

## 2024-01-26 HISTORY — DX: Other pulmonary embolism without acute cor pulmonale: I26.99

## 2024-01-26 HISTORY — DX: Ileus, unspecified: K56.7

## 2024-01-26 LAB — CBC WITH DIFFERENTIAL/PLATELET
Abs Immature Granulocytes: 0.08 10*3/uL — ABNORMAL HIGH (ref 0.00–0.07)
Basophils Absolute: 0 10*3/uL (ref 0.0–0.1)
Basophils Relative: 0 %
Eosinophils Absolute: 0.1 10*3/uL (ref 0.0–0.5)
Eosinophils Relative: 1 %
HCT: 37 % — ABNORMAL LOW (ref 39.0–52.0)
Hemoglobin: 12.7 g/dL — ABNORMAL LOW (ref 13.0–17.0)
Immature Granulocytes: 1 %
Lymphocytes Relative: 3 %
Lymphs Abs: 0.4 10*3/uL — ABNORMAL LOW (ref 0.7–4.0)
MCH: 30.8 pg (ref 26.0–34.0)
MCHC: 34.3 g/dL (ref 30.0–36.0)
MCV: 89.6 fL (ref 80.0–100.0)
Monocytes Absolute: 0.8 10*3/uL (ref 0.1–1.0)
Monocytes Relative: 7 %
Neutro Abs: 11 10*3/uL — ABNORMAL HIGH (ref 1.7–7.7)
Neutrophils Relative %: 88 %
Platelets: 318 10*3/uL (ref 150–400)
RBC: 4.13 MIL/uL — ABNORMAL LOW (ref 4.22–5.81)
RDW: 12.4 % (ref 11.5–15.5)
WBC: 12.4 10*3/uL — ABNORMAL HIGH (ref 4.0–10.5)
nRBC: 0 % (ref 0.0–0.2)

## 2024-01-26 LAB — URINALYSIS, ROUTINE W REFLEX MICROSCOPIC
Bacteria, UA: NONE SEEN
Bilirubin Urine: NEGATIVE
Glucose, UA: NEGATIVE mg/dL
Hgb urine dipstick: NEGATIVE
Ketones, ur: 20 mg/dL — AB
Leukocytes,Ua: NEGATIVE
Nitrite: NEGATIVE
Protein, ur: 100 mg/dL — AB
Specific Gravity, Urine: 1.025 (ref 1.005–1.030)
pH: 6 (ref 5.0–8.0)

## 2024-01-26 LAB — RESP PANEL BY RT-PCR (RSV, FLU A&B, COVID)  RVPGX2
Influenza A by PCR: NEGATIVE
Influenza B by PCR: NEGATIVE
Resp Syncytial Virus by PCR: NEGATIVE
SARS Coronavirus 2 by RT PCR: NEGATIVE

## 2024-01-26 LAB — HEPARIN LEVEL (UNFRACTIONATED): Heparin Unfractionated: 0.19 [IU]/mL — ABNORMAL LOW (ref 0.30–0.70)

## 2024-01-26 LAB — COMPREHENSIVE METABOLIC PANEL
ALT: 20 U/L (ref 0–44)
AST: 31 U/L (ref 15–41)
Albumin: 3.8 g/dL (ref 3.5–5.0)
Alkaline Phosphatase: 61 U/L (ref 38–126)
Anion gap: 14 (ref 5–15)
BUN: 17 mg/dL (ref 8–23)
CO2: 24 mmol/L (ref 22–32)
Calcium: 9.4 mg/dL (ref 8.9–10.3)
Chloride: 99 mmol/L (ref 98–111)
Creatinine, Ser: 0.8 mg/dL (ref 0.61–1.24)
GFR, Estimated: >60 mL/min (ref >60)
Glucose, Bld: 139 mg/dL — ABNORMAL HIGH (ref 70–99)
Potassium: 3.9 mmol/L (ref 3.5–5.1)
Sodium: 137 mmol/L (ref 135–145)
Total Bilirubin: 2.2 mg/dL — ABNORMAL HIGH (ref 0.0–1.2)
Total Protein: 8.1 g/dL (ref 6.5–8.1)

## 2024-01-26 LAB — LIPASE, BLOOD: Lipase: 28 U/L (ref 11–51)

## 2024-01-26 MED ORDER — ACETAMINOPHEN 650 MG RE SUPP: 650.0000 mg | Freq: Four times a day (QID) | RECTAL | Status: DC | PRN

## 2024-01-26 MED ORDER — VITAMIN K (PHYTONADIONE) 100 MCG PO TABS: 100.0000 ug | ORAL_TABLET | Freq: Every day | ORAL | Status: DC

## 2024-01-26 MED ORDER — MORPHINE SULFATE (PF) 2 MG/ML IV SOLN
2.0000 mg | Freq: Once | INTRAVENOUS | Status: AC
  Administered 2024-01-26: 2 mg via INTRAVENOUS
  Filled 2024-01-26: qty 1

## 2024-01-26 MED ORDER — SODIUM CHLORIDE 0.9 % IV BOLUS
1000.0000 mL | Freq: Once | INTRAVENOUS | Status: AC
  Administered 2024-01-26: 1000 mL via INTRAVENOUS

## 2024-01-26 MED ORDER — ASPIRIN 81 MG PO TBEC
81.0000 mg | DELAYED_RELEASE_TABLET | Freq: Every day | ORAL | Status: DC
  Administered 2024-01-26 – 2024-02-01 (×7): 81 mg via ORAL
  Filled 2024-01-26 (×7): qty 1

## 2024-01-26 MED ORDER — IOHEXOL 350 MG/ML SOLN
100.0000 mL | Freq: Once | INTRAVENOUS | Status: AC | PRN
  Administered 2024-01-26: 100 mL via INTRAVENOUS

## 2024-01-26 MED ORDER — OMEGA-3-ACID ETHYL ESTERS 1 G PO CAPS
1000.0000 mg | ORAL_CAPSULE | Freq: Every day | ORAL | Status: DC
  Administered 2024-01-26 – 2024-02-01 (×7): 1000 mg via ORAL
  Filled 2024-01-26 (×7): qty 1

## 2024-01-26 MED ORDER — LACTATED RINGERS IV SOLN: INTRAVENOUS | Status: AC

## 2024-01-26 MED ORDER — ONDANSETRON HCL 4 MG/2ML IJ SOLN
4.0000 mg | Freq: Four times a day (QID) | INTRAMUSCULAR | Status: DC | PRN
  Administered 2024-01-26 – 2024-02-01 (×6): 4 mg via INTRAVENOUS
  Filled 2024-01-26 (×6): qty 2

## 2024-01-26 MED ORDER — ADULT MULTIVITAMIN W/MINERALS CH
1.0000 | ORAL_TABLET | Freq: Every day | ORAL | Status: DC
  Administered 2024-01-27 – 2024-02-01 (×6): 1 via ORAL
  Filled 2024-01-26 (×6): qty 1

## 2024-01-26 MED ORDER — POLYETHYLENE GLYCOL 3350 17 G PO PACK
17.0000 g | PACK | Freq: Two times a day (BID) | ORAL | Status: DC
  Administered 2024-01-26 – 2024-01-28 (×5): 17 g via ORAL
  Filled 2024-01-26 (×6): qty 1

## 2024-01-26 MED ORDER — ONDANSETRON HCL 4 MG/2ML IJ SOLN
4.0000 mg | Freq: Once | INTRAMUSCULAR | Status: AC
  Administered 2024-01-26: 4 mg via INTRAVENOUS
  Filled 2024-01-26: qty 2

## 2024-01-26 MED ORDER — VITAMIN B-12 1000 MCG PO TABS
1000.0000 ug | ORAL_TABLET | Freq: Every day | ORAL | Status: DC
  Administered 2024-01-27 – 2024-02-01 (×6): 1000 ug via ORAL
  Filled 2024-01-26 (×6): qty 1

## 2024-01-26 MED ORDER — ONDANSETRON HCL 4 MG PO TABS
4.0000 mg | ORAL_TABLET | Freq: Four times a day (QID) | ORAL | Status: DC | PRN
Start: 2024-01-26 — End: 2024-02-01

## 2024-01-26 MED ORDER — DIPHENHYDRAMINE HCL 25 MG PO CAPS
50.0000 mg | ORAL_CAPSULE | Freq: Every evening | ORAL | Status: DC | PRN
  Administered 2024-01-27 – 2024-01-29 (×3): 50 mg via ORAL
  Filled 2024-01-26 (×3): qty 2

## 2024-01-26 MED ORDER — METHOCARBAMOL 1000 MG/10ML IJ SOLN
500.0000 mg | Freq: Four times a day (QID) | INTRAMUSCULAR | Status: DC | PRN
  Administered 2024-01-27: 500 mg via INTRAVENOUS
  Filled 2024-01-26 (×2): qty 5

## 2024-01-26 MED ORDER — HEPARIN (PORCINE) 25000 UT/250ML-% IV SOLN
1750.0000 [IU]/h | INTRAVENOUS | Status: DC
  Administered 2024-01-26: 1400 [IU]/h via INTRAVENOUS
  Administered 2024-01-27 – 2024-01-30 (×6): 1750 [IU]/h via INTRAVENOUS
  Filled 2024-01-26 (×9): qty 250

## 2024-01-26 MED ORDER — ACETAMINOPHEN 325 MG PO TABS
650.0000 mg | ORAL_TABLET | Freq: Four times a day (QID) | ORAL | Status: DC | PRN
  Administered 2024-01-26 – 2024-01-27 (×2): 650 mg via ORAL
  Filled 2024-01-26 (×2): qty 2

## 2024-01-26 MED ORDER — HEPARIN BOLUS VIA INFUSION
5000.0000 [IU] | Freq: Once | INTRAVENOUS | Status: AC
  Administered 2024-01-26: 5000 [IU] via INTRAVENOUS
  Filled 2024-01-26: qty 5000

## 2024-01-26 MED ORDER — BISACODYL 10 MG RE SUPP
10.0000 mg | Freq: Every day | RECTAL | Status: DC | PRN
  Administered 2024-01-27: 10 mg via RECTAL
  Filled 2024-01-26 (×2): qty 1

## 2024-01-26 MED ORDER — DIPHENHYDRAMINE-APAP (SLEEP) 25-500 MG PO TABS: 2.0000 | ORAL_TABLET | Freq: Every day | ORAL | Status: DC

## 2024-01-26 MED ORDER — ACETAMINOPHEN 500 MG PO TABS
500.0000 mg | ORAL_TABLET | Freq: Every evening | ORAL | Status: DC | PRN
  Administered 2024-01-27 – 2024-01-29 (×3): 500 mg via ORAL
  Filled 2024-01-26 (×3): qty 1

## 2024-01-26 MED ORDER — BUPROPION HCL ER (SR) 100 MG PO TB12
100.0000 mg | ORAL_TABLET | Freq: Every day | ORAL | Status: DC
  Administered 2024-01-26 – 2024-01-31 (×6): 100 mg via ORAL
  Filled 2024-01-26 (×6): qty 1

## 2024-01-26 MED ORDER — VITAMIN D 25 MCG (1000 UNIT) PO TABS
2000.0000 [IU] | ORAL_TABLET | Freq: Every day | ORAL | Status: DC
  Administered 2024-01-26 – 2024-02-01 (×7): 2000 [IU] via ORAL
  Filled 2024-01-26 (×7): qty 2

## 2024-01-26 NOTE — Plan of Care (Signed)
Patient is alert and oriented X4, all admission questions answered appropriately, tele monitor on and functioning well in NSR. Patient is an assist X1 with walker, 2 PIV's intact, Heparin gtt running accurately as ordered-1,400Units/hr. PRN Tylenol administered for 3/10 right leg pain. Call bell within reach, bed at lowest position.  Problem: Education: Goal: Knowledge of General Education information will improve Description: Including pain rating scale, medication(s)/side effects and non-pharmacologic comfort measures Outcome: Progressing   Problem: Health Behavior/Discharge Planning: Goal: Ability to manage health-related needs will improve Outcome: Progressing   Problem: Clinical Measurements: Goal: Ability to maintain clinical measurements within normal limits will improve Outcome: Progressing Goal: Will remain free from infection Outcome: Progressing Goal: Diagnostic test results will improve Outcome: Progressing Goal: Respiratory complications will improve Outcome: Progressing Goal: Cardiovascular complication will be avoided Outcome: Progressing   Problem: Activity: Goal: Risk for activity intolerance will decrease Outcome: Progressing   Problem: Nutrition: Goal: Adequate nutrition will be maintained Outcome: Progressing   Problem: Coping: Goal: Level of anxiety will decrease Outcome: Progressing   Problem: Elimination: Goal: Will not experience complications related to bowel motility Outcome: Progressing Goal: Will not experience complications related to urinary retention Outcome: Progressing   Problem: Pain Managment: Goal: General experience of comfort will improve and/or be controlled Outcome: Progressing   Problem: Safety: Goal: Ability to remain free from injury will improve Outcome: Progressing   Problem: Skin Integrity: Goal: Risk for impaired skin integrity will decrease Outcome: Progressing

## 2024-01-26 NOTE — ED Triage Notes (Signed)
Pt BIBA from home for nausea vomiting and constipation.  Pt developed shortness of breath in route with sats dropping from 96% to 94% with increasing respirations

## 2024-01-26 NOTE — ED Provider Notes (Signed)
[place] EMERGENCY DEPARTMENT AT [facility] Provider Note   CSN: [id number] Arrival date & time: 01/26/24  7829     History  Chief Complaint  Patient presents with   Shortness of Breath    James Tate is a 78 y.o. male.  78 year old with recent knee surgery called EMS today due to generalized abdominal pain, nausea and vomiting.  Had not had a bowel movement since Sunday.  Worsening shortness of breath this morning as well.  No chest pain.   Shortness of Breath      Home Medications Prior to Admission medications   Medication Sig Start Date End Date Taking? Authorizing Provider  aspirin EC 81 MG tablet Take 1 tablet (81 mg total) by mouth 2 (two) times daily. 01/23/24  Yes Allena Katz, PA-C  buPROPion ER [facility] SR) 100 MG 12 hr tablet Take 100 mg by mouth at bedtime. 10/28/23  Yes [provider]  Cholecalciferol (D2000 ULTRA STRENGTH) 50 MCG (2000 UT) CAPS Take 2,000 Units by mouth daily.   Yes [provider]  cyanocobalamin (VITAMIN B12) 1000 MCG tablet Take 1,000 mcg by mouth every morning.   Yes [provider]  diphenhydramine-acetaminophen (TYLENOL PM) 25-500 MG TABS tablet Take 2 tablets by mouth at bedtime.   Yes [provider]  Multiple Minerals-Vitamins (CAL-MAG-ZINC-D PO) Take 1 tablet by mouth daily.   Yes [provider]  Multiple Vitamin (MULTIVITAMIN) capsule Take 1 capsule by mouth daily.   Yes [provider]  Omega-3 Fatty Acids (OMEGA 3 PO) Take 1 capsule by mouth every morning.   Yes [provider]  oxyCODONE (ROXICODONE) 5 MG immediate release tablet Take 1 tablet (5 mg total) by mouth every 4 (four) hours as needed for severe pain (pain score 7-10). 01/23/24  Yes Vear Clock, Eric K, PA-C  REPATHA SURECLICK 140 MG/ML SOAJ ADMINISTER 1 ML UNDER THE SKIN EVERY 14 DAYS Patient taking differently: Inject 140 mg into the skin every 14 (fourteen) days. 11/28/23  Yes Wendall Stade, MD  tadalafil (CIALIS) 20 MG tablet Take 20 mg by mouth daily as needed for erectile dysfunction.   Yes [provider]  tiZANidine (ZANAFLEX) 2 MG tablet Take 1 tablet (2 mg total) by mouth every 6 (six) hours as needed for muscle spasms. 01/23/24  Yes Dannielle Burn K, PA-C  triamcinolone (KENALOG) 0.1 % paste Use as directed 1 application in the mouth or throat 2 (two) times daily as needed (mouth sores).   Yes [provider]  Vitamin K, Phytonadione, 100 MCG TABS Take 100 mcg by mouth daily.    Yes [provider]      Allergies    Atorvastatin, Codeine, Erythromycin, and Rosuvastatin calcium    Review of Systems   Review of Systems  Respiratory:  Positive for shortness of breath.     Physical Exam Updated Vital Signs BP (!) 141/78 (BP Location: Left Arm)   Pulse 91   Temp 98.4 F (36.9 C) (Oral)   Resp (!) 24   SpO2 93%  Physical Exam Vitals and nursing note reviewed.  Constitutional:      General: He is not in acute distress.    Appearance: He is not toxic-appearing.  HENT:     Head: Normocephalic.  Eyes:     Pupils: Pupils are equal, round, and reactive to light.  Cardiovascular:     Rate and Rhythm: Normal rate and regular rhythm.  Pulmonary:     Effort: Pulmonary  effort is normal. Tachypnea present.     Breath sounds: Normal breath sounds.  Abdominal:     General: Abdomen is flat. Distension: diffuse.     Palpations: Abdomen is soft.     Tenderness: There is abdominal tenderness (diffuse). There is no guarding or rebound.  Musculoskeletal:     Comments: RLE with evidence of recent sugery. Bandage over wound. No strikethrough. Global swelling noted. No erythema to knee.   Skin:    General: Skin is warm.     Capillary Refill: Capillary refill takes less than 2 seconds.  Neurological:     Mental Status: He is alert and oriented to person, place, and time.  Psychiatric:        Mood and Affect: Mood normal.        Behavior:  Behavior normal.     ED Results / Procedures / Treatments   Labs (all labs ordered are listed, but only abnormal results are displayed) Labs Reviewed  CBC WITH DIFFERENTIAL/PLATELET - Abnormal; Notable for the following components:      Result Value   WBC 12.4 (*)    RBC 4.13 (*)    Hemoglobin 12.7 (*)    HCT 37.0 (*)    Neutro Abs 11.0 (*)    Lymphs Abs 0.4 (*)    Abs Immature Granulocytes 0.08 (*)    All other components within normal limits  COMPREHENSIVE METABOLIC PANEL - Abnormal; Notable for the following components:   Glucose, Bld 139 (*)    Total Bilirubin 2.2 (*)    All other components within normal limits  URINALYSIS, ROUTINE W REFLEX MICROSCOPIC - Abnormal; Notable for the following components:   Ketones, ur 20 (*)    Protein, ur 100 (*)    All other components within normal limits  RESP PANEL BY RT-PCR (RSV, FLU A&B, COVID)  RVPGX2  LIPASE, BLOOD    EKG None  Radiology CT Angio Chest PE W and/or Wo Contrast Addendum Date: 01/26/2024 ADDENDUM REPORT: 01/26/2024 11:59 ADDENDUM: Findings discussed by telephone with Dr. Maple Hudson 11:55 a.m., 01/26/2024. By report, patient underwent right knee arthroplasty 2 days ago. Electronically Signed   By: Jearld Lesch M.D.   On: 01/26/2024 11:59   Result Date: 01/26/2024 CLINICAL DATA:  PE suspected, nausea, vomiting, constipation, shortness of breath, history of prostate cancer * Tracking Code: BO * EXAM: CT ANGIOGRAPHY CHEST CT ABDOMEN AND PELVIS WITH CONTRAST TECHNIQUE: Multidetector CT imaging of the chest was performed using the standard protocol during bolus administration of intravenous contrast. Multiplanar CT image reconstructions and MIPs were obtained to evaluate the vascular anatomy. Multidetector CT imaging of the abdomen and pelvis was performed using the standard protocol during bolus administration of intravenous contrast. RADIATION DOSE REDUCTION: This exam was performed according to the departmental dose-optimization  program which includes automated exposure control, adjustment of the mA and/or kV according to patient size and/or use of iterative reconstruction technique. CONTRAST:  OMNIPAQUE IOHEXOL 350 MG/ML SOLN COMPARISON:  PET-CT, 03/18/2022 FINDINGS: CT CHEST ANGIOGRAM FINDINGS Cardiovascular: Satisfactory opacification of the pulmonary arteries to the segmental level. Positive examination for pulmonary embolism with a single focus of segmental to subsegmental embolus in the posterior right upper lobe (series 4, image 57). Cardiomegaly. Three-vessel coronary artery calcifications. No pericardial effusion. Mediastinum/Nodes: No enlarged mediastinal, hilar, or axillary lymph nodes. Thyroid gland, trachea, and esophagus demonstrate no significant findings. Lungs/Pleura: Bibasilar scarring and volume loss, with elevation of the right hemidiaphragm. No pleural effusion or pneumothorax. Musculoskeletal: No chest wall abnormality.  No acute osseous findings. Review of the MIP images confirms the above findings. CT ABDOMEN PELVIS FINDINGS Hepatobiliary: No focal liver abnormality is seen. Status post cholecystectomy. No biliary dilatation. Pancreas: Unremarkable. No pancreatic ductal dilatation or surrounding inflammatory changes. Spleen: Normal in size without significant abnormality. Adrenals/Urinary Tract: Adrenal glands are unremarkable. Kidneys are normal, without renal calculi, solid lesion, or hydronephrosis. Bladder is unremarkable. Stomach/Bowel: Stomach is within normal limits. Appendix appears normal. The mid small bowel is air and fluid-filled, although not overtly distended, loops measuring up to 3.7 cm in caliber without abrupt transition point, and scattered gas and stool present throughout the colon to the rectum. Vascular/Lymphatic: Aortic atherosclerosis. No enlarged abdominal or pelvic lymph nodes. Reproductive: Prostatomegaly with biopsy marking clips or fiducials. Other: No abdominal wall hernia or  abnormality. No ascites. Musculoskeletal: No acute osseous findings. Soft tissue stranding and gas loculations noted in the partially imaged proximal right vastus musculature (series 2, image 103). IMPRESSION: 1. Positive examination for pulmonary embolism with a single focus of segmental to subsegmental embolus in the posterior right upper lobe. No evidence of right heart strain. 2. The mid small bowel is air and fluid-filled, although not overtly distended, loops measuring up to 3.7 cm in caliber without abrupt transition point, and scattered gas and stool present throughout the colon to the rectum. Findings are most consistent with ileus or enteritis. 3. Soft tissue stranding and gas loculations noted in the partially imaged proximal right vastus musculature. This is of uncertain significance, however concerning for gas-forming infection in the absence of recent surgical procedure or other explanation. Consider dedicated imaging of the right lower extremity. 4. Prostatomegaly with biopsy marking clips or fiducials. No evidence of lymphadenopathy or metastatic disease in the abdomen or pelvis. 5. Cardiomegaly and coronary artery disease. Call report request was placed at the time of interpretation. Report issued at this time in the interest of expediency. Final communication of critical findings will be documented. Aortic Atherosclerosis (ICD10-I70.0). Electronically Signed: By: Jearld Lesch M.D. On: 01/26/2024 11:49   CT ABDOMEN PELVIS W CONTRAST Addendum Date: 01/26/2024 ADDENDUM REPORT: 01/26/2024 11:59 ADDENDUM: Findings discussed by telephone with Dr. Maple Hudson 11:55 a.m., 01/26/2024. By report, patient underwent right knee arthroplasty 2 days ago. Electronically Signed   By: Jearld Lesch M.D.   On: 01/26/2024 11:59   Result Date: 01/26/2024 CLINICAL DATA:  PE suspected, nausea, vomiting, constipation, shortness of breath, history of prostate cancer * Tracking Code: BO * EXAM: CT ANGIOGRAPHY CHEST CT  ABDOMEN AND PELVIS WITH CONTRAST TECHNIQUE: Multidetector CT imaging of the chest was performed using the standard protocol during bolus administration of intravenous contrast. Multiplanar CT image reconstructions and MIPs were obtained to evaluate the vascular anatomy. Multidetector CT imaging of the abdomen and pelvis was performed using the standard protocol during bolus administration of intravenous contrast. RADIATION DOSE REDUCTION: This exam was performed according to the departmental dose-optimization program which includes automated exposure control, adjustment of the mA and/or kV according to patient size and/or use of iterative reconstruction technique. CONTRAST:  OMNIPAQUE IOHEXOL 350 MG/ML SOLN COMPARISON:  PET-CT, 03/18/2022 FINDINGS: CT CHEST ANGIOGRAM FINDINGS Cardiovascular: Satisfactory opacification of the pulmonary arteries to the segmental level. Positive examination for pulmonary embolism with a single focus of segmental to subsegmental embolus in the posterior right upper lobe (series 4, image 57). Cardiomegaly. Three-vessel coronary artery calcifications. No pericardial effusion. Mediastinum/Nodes: No enlarged mediastinal, hilar, or axillary lymph nodes. Thyroid gland, trachea, and esophagus demonstrate no significant findings. Lungs/Pleura: Bibasilar scarring and  volume loss, with elevation of the right hemidiaphragm. No pleural effusion or pneumothorax. Musculoskeletal: No chest wall abnormality. No acute osseous findings. Review of the MIP images confirms the above findings. CT ABDOMEN PELVIS FINDINGS Hepatobiliary: No focal liver abnormality is seen. Status post cholecystectomy. No biliary dilatation. Pancreas: Unremarkable. No pancreatic ductal dilatation or surrounding inflammatory changes. Spleen: Normal in size without significant abnormality. Adrenals/Urinary Tract: Adrenal glands are unremarkable. Kidneys are normal, without renal calculi, solid lesion, or hydronephrosis.  Bladder is unremarkable. Stomach/Bowel: Stomach is within normal limits. Appendix appears normal. The mid small bowel is air and fluid-filled, although not overtly distended, loops measuring up to 3.7 cm in caliber without abrupt transition point, and scattered gas and stool present throughout the colon to the rectum. Vascular/Lymphatic: Aortic atherosclerosis. No enlarged abdominal or pelvic lymph nodes. Reproductive: Prostatomegaly with biopsy marking clips or fiducials. Other: No abdominal wall hernia or abnormality. No ascites. Musculoskeletal: No acute osseous findings. Soft tissue stranding and gas loculations noted in the partially imaged proximal right vastus musculature (series 2, image 103). IMPRESSION: 1. Positive examination for pulmonary embolism with a single focus of segmental to subsegmental embolus in the posterior right upper lobe. No evidence of right heart strain. 2. The mid small bowel is air and fluid-filled, although not overtly distended, loops measuring up to 3.7 cm in caliber without abrupt transition point, and scattered gas and stool present throughout the colon to the rectum. Findings are most consistent with ileus or enteritis. 3. Soft tissue stranding and gas loculations noted in the partially imaged proximal right vastus musculature. This is of uncertain significance, however concerning for gas-forming infection in the absence of recent surgical procedure or other explanation. Consider dedicated imaging of the right lower extremity. 4. Prostatomegaly with biopsy marking clips or fiducials. No evidence of lymphadenopathy or metastatic disease in the abdomen or pelvis. 5. Cardiomegaly and coronary artery disease. Call report request was placed at the time of interpretation. Report issued at this time in the interest of expediency. Final communication of critical findings will be documented. Aortic Atherosclerosis (ICD10-I70.0). Electronically Signed: By: Jearld Lesch M.D. On:  01/26/2024 11:49    Procedures Procedures    Medications Ordered in ED Medications  heparin bolus via infusion 5,000 Units (has no administration in time range)  heparin ADULT infusion 100 units/mL (25000 units/278mL) (1,400 Units/hr Intravenous New Bag/Given 01/26/24 1415)  bisacodyl (DULCOLAX) suppository 10 mg (has no administration in time range)  polyethylene glycol (MIRALAX / GLYCOLAX) packet 17 g (has no administration in time range)  lactated ringers infusion (has no administration in time range)  acetaminophen (TYLENOL) tablet 650 mg (has no administration in time range)    Or  acetaminophen (TYLENOL) suppository 650 mg (has no administration in time range)  methocarbamol (ROBAXIN) injection 500 mg (has no administration in time range)  ondansetron (ZOFRAN) tablet 4 mg (has no administration in time range)    Or  ondansetron (ZOFRAN) injection 4 mg (has no administration in time range)  aspirin EC tablet 81 mg (has no administration in time range)  buPROPion ER (WELLBUTRIN SR) 12 hr tablet 100 mg (has no administration in time range)  Cholecalciferol CAPS 2,000 Units (has no administration in time range)  cyanocobalamin (VITAMIN B12) tablet 1,000 mcg (has no administration in time range)  diphenhydramine-acetaminophen (TYLENOL PM) 25-500 MG per tablet 2 tablet (has no administration in time range)  multivitamin capsule 1 capsule (has no administration in time range)  Omega 3 CAPS (has no administration in time  range)  Vitamin K (Phytonadione) TABS 100 mcg (has no administration in time range)  ondansetron (ZOFRAN) injection 4 mg (4 mg Intravenous Given 01/26/24 1022)  sodium chloride 0.9 % bolus 1,000 mL (0 mLs Intravenous Stopped 01/26/24 1323)  iohexol (OMNIPAQUE) 350 MG/ML injection 100 mL (100 mLs Intravenous Contrast Given 01/26/24 1106)  morphine (PF) 2 MG/ML injection 2 mg (2 mg Intravenous Given 01/26/24 1320)    ED Course/ Medical Decision Making/ A&P Clinical Course  as of 01/26/24 1504  Thu Jan 26, 2024  1154 CT Angio Chest PE W and/or Wo Contrast IMPRESSION: 1. Positive examination for pulmonary embolism with a single focus of segmental to subsegmental embolus in the posterior right upper lobe. No evidence of right heart strain. 2. The mid small bowel is air and fluid-filled, although not overtly distended, loops measuring up to 3.7 cm in caliber without abrupt transition point, and scattered gas and stool present throughout the colon to the rectum. Findings are most consistent with ileus or enteritis. 3. Soft tissue stranding and gas loculations noted in the partially imaged proximal right vastus musculature. This is of uncertain significance, however concerning for gas-forming infection in the absence of recent surgical procedure or other explanation. Consider dedicated imaging of the right lower extremity. 4. Prostatomegaly with biopsy marking clips or fiducials. No evidence of lymphadenopathy or metastatic disease in the abdomen or pelvis. 5. Cardiomegaly and coronary artery disease.  Call report request was placed at the time of interpretation. Report issued at this time in the interest of expediency. Final communication of critical findings will be documented.  Aortic Atherosclerosis (ICD10-I70.0).   Electronically Signed   By: Jearld Lesch M.D.   On: 01/26/2024 11:49   [TY]    Clinical Course User Index [TY] Coral Spikes, DO                                 Medical Decision Making This is a 78 year old male with recent knee surgery presenting emergency department for generalized abdominal pain nausea vomiting and shortness of breath.  Mildly tachypneic.  Per chart review last echo in 2020 with normal EF.  Had knee replacement done 3 days ago.  Complaining of shortness of breath.  Concern for possible PE given recent surgery.  Will get CTA.  Also having diffuse abdominal pain; suspect constipation.  However given advanced age  we will get CT scan to evaluate for mechanical obstruction/SBO.  Will get basic screening labs as well.  Given Zofran for nausea.  Will give IV fluids as well.  Updated MDM.  Patient with pulmonary embolism on CT scan as well as ileus.  Minor leukocytosis which I suspect is reactionary to recent surgery.  Stable anemia.  No significant metabolic derangements.  Normal liver enzymes.  Lipase not elevated.  Pancreatitis unlikely.  Does not have acute urinary tract infection.  Continue to have pain, given IV morphine for pain control.  Given IV fluids.  Given his PE started on heparin.  Case discussed with hospitalist as patient with advanced age and multiple comorbid medical conditions that predispose him to decompensation.  Will admit for his ileus and pulmonary embolism.  Amount and/or Complexity of Data Reviewed Independent Historian: EMS    Details: Reported stable vitals although they did note that SpO2 seemingly was dropping and round, but never desaturated below 90%. Labs: ordered. Decision-making details documented in ED Course. Radiology: ordered. Decision-making details documented in ED  Course. ECG/medicine tests:  Decision-making details documented in ED Course.  Risk Prescription drug management. Decision regarding hospitalization.         Final Clinical Impression(s) / ED Diagnoses Final diagnoses:  None    Rx / DC Orders ED Discharge Orders     None         Coral Spikes, DO 01/26/24 1504

## 2024-01-26 NOTE — Progress Notes (Signed)
PHARMACY - ANTICOAGULATION CONSULT NOTE  Pharmacy Consult for Heparin Indication: pulmonary embolus  Allergies  Allergen Reactions   Atorvastatin Other (See Comments)    Aches in legs   Codeine Other (See Comments)    Headache.    Erythromycin     Childhood allergy - unknown   Rosuvastatin Calcium     Memory problem (03/2020) Aches in   legs    Patient Measurements:   Heparin Dosing Weight: 85 kg   Vital Signs: Temp: 97.9 F (36.6 C) (02/20 0937) Temp Source: Oral (02/20 0937) BP: 143/73 (02/20 0941) Pulse Rate: 85 (02/20 0937)  Labs: Recent Labs    01/26/24 0951  HGB 12.7*  HCT 37.0*  PLT 318  CREATININE 0.80    Estimated Creatinine Clearance: 84.9 mL/min (by C-G formula based on SCr of 0.8 mg/dL).   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   Flat feet    Glaucoma    left eye   History of colon polyps    History of hyperparathyroidism    Melanoma of back (HCC)    MR (mitral regurgitation) 05/16/2018   Moderate noted on ECHO   MVP (mitral valve prolapse) 05/16/2018   Noted on ECHO   OSA on CPAP    cpap set on 4   Osteopenia    resolved with medical therapy (calcium and fosamax)   PFO (patent foramen ovale)    Sensorineural hearing loss (SNHL) of both ears    Does Not wear HAs    Medications:  Scheduled:    morphine injection  2 mg Intravenous Once   Infusions:   Assessment: 47 yoM presented to ED on 2/20 for N/V, constipation, and SOB.  CTa positive for PE.  Noted recent total right knee arthroplasty procedure on 2/17 - discharged on Aspirin 81mg  PO BID. Pharmacy is consulted to dose Heparin IV.  CBC:  Hgb 12.7, Plt 318  Goal of Therapy:  Heparin level 0.3-0.7 units/ml Monitor platelets by anticoagulation protocol: Yes   Plan:  Give heparin 5000 units bolus IV x 1 Start heparin IV infusion at 1400 units/hr Heparin  level 8 hours after starting Daily heparin level and CBC  Lynann Beaver PharmD, BCPS [facility] main pharmacy [phone] 01/26/2024 12:24 PM

## 2024-01-26 NOTE — Progress Notes (Signed)
PHARMACY - ANTICOAGULATION CONSULT NOTE  Pharmacy Consult for Heparin Indication: pulmonary embolus  Allergies  Allergen Reactions   Atorvastatin Other (See Comments)    Aches in legs   Codeine Other (See Comments)    Headache.    Erythromycin     Childhood allergy - unknown   Rosuvastatin Calcium     Memory problem (03/2020) Aches in   legs    Patient Measurements: Height: 6' (182.9 cm) Weight: 85.7 kg (189 lb) IBW/kg (Calculated) : 77.6 Heparin Dosing Weight: 85 kg   Vital Signs: Temp: 98.4 F (36.9 C) (02/20 2136) Temp Source: Oral (02/20 2136) BP: 129/78 (02/20 2136) Pulse Rate: 84 (02/20 2136)  Labs: Recent Labs    01/26/24 0951 01/26/24 2321  HGB 12.7*  --   HCT 37.0*  --   PLT 318  --   HEPARINUNFRC  --  0.19*  CREATININE 0.80  --     Estimated Creatinine Clearance: 84.9 mL/min (by C-G formula based on SCr of 0.8 mg/dL).   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   Flat feet    Glaucoma    left eye   History of colon polyps    History of hyperparathyroidism    Melanoma of back (HCC)    MR (mitral regurgitation) 05/16/2018   Moderate noted on ECHO   MVP (mitral valve prolapse) 05/16/2018   Noted on ECHO   OSA on CPAP    cpap set on 4   Osteopenia    resolved with medical therapy (calcium and fosamax)   PFO (patent foramen ovale)    Sensorineural hearing loss (SNHL) of both ears    Does Not wear HAs    Medications:  Scheduled:   aspirin EC  81 mg Oral Daily   buPROPion ER  100 mg Oral QHS   cholecalciferol  2,000 Units Oral Daily   [START ON 01/27/2024] cyanocobalamin  1,000 mcg Oral Daily   [START ON 01/27/2024] multivitamin with minerals  1 tablet Oral Daily   omega-3 acid ethyl esters  1,000 mg Oral Daily   polyethylene glycol  17 g Oral BID   Infusions:   heparin 1,400 Units/hr (01/26/24 1837)   lactated  ringers 75 mL/hr at 01/26/24 1837    Assessment: 78 yoM presented to ED on 2/20 for N/V, constipation, and SOB.  CTa positive for PE.  Noted recent total right knee arthroplasty procedure on 2/17 - discharged on Aspirin 81mg  PO BID. Pharmacy is consulted to dose Heparin IV.  CBC:  Hgb 12.7, Plt 318  HL 0.19 subtherapeutic on 1400 units/hr Per RN no bleeding  Goal of Therapy:  Heparin level 0.3-0.7 units/ml Monitor platelets by anticoagulation protocol: Yes   Plan:  Give heparin 2500 units bolus IV x 1 Increase heparin drip to 1650 units/hr Heparin level in 8 hours  Daily heparin level and CBC  Arley Phenix RPh 01/27/2024, 12:00 AM

## 2024-01-26 NOTE — H&P (Signed)
History and Physical    James Tate AVW:[id number] DOB: November 01, 1946 DOA: 01/26/2024  PCP: Debroah Loop, DO   Patient coming from: Home w/ wife. 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 He was also complained of shortness of breath today morning without chest pain. He is passing flatus. He had vomiting x 2 yesterday and none since.  He has not been moving much since surgery, has been taking aspirin return twice daily for DVT prophylaxis postop.  Patient already denies fever chills focal weakness, leg edema  In the ED: Vital stable saturating 93-90% on room air.  Labs reviewed mild leukocytosis  otherwise stable, Underwent CT angio chest PE and CT abdomen pelvis: Positive for PE with a single focus of segmental to subsegmental embolus in the posterior  RUL, The mid small bowel is air and fluid-filled, although not overtly distended, loops measuring up to 3.7 cm in caliber without abrupt transition point, and scattered gas and stool present throughout the colon to the rectum. Findings are most consistent with ileus or enteritis.  Soft tissue stranding and gas loculations noted in the partially imaged proximal right vastus musculature. This is of uncertain significance, however concerning for gas-forming infection in the absence of recent surgical procedure or other explanation-but later reported attended indicating recent surgical procedure with right knee arthroplasty likely the cause for the gas.Prostatomegaly with biopsy marking clips or fiducials. Patient  was placed on heparin, ivf antiemetics and admission was requested.    Assessment/Plan Principal Problem:   Acute pulmonary embolism (HCC) Active Problems:   Cryptogenic stroke (HCC)   Primary osteoarthritis of left knee   Malignant neoplasm of prostate (HCC)   Hearing loss   Hypercholesterolemia   Osteopenia   Ileus (HCC)  Acute PE segmental to subsegmental RUL: Cont heparin drip- check duplex tte. Suspect 2/2 recent knee surgery on 2/17 despite being on aspirin twice daily.  Currently hemodynamically stable.  Nausea vomiting abdomen pain concerning for ileus: CT abdomen no acute finding CMP cont symptomatic management- Dulcolax/Miralax and ivf, antiemetics. Xray abd in am. NGT if recurrent vomiting-no vomiting since yesterday.  Mild leucocytosis: Monitor. No fever.  Adding IV fluids.  Right knee arthroplasty 01/23/24 Ptot and pain control-has not been taking narcotics on hold, continue Tylenol, muscle relaxant  Cryptogenic stroke in April 2016 history of PFO/MR: followed by cardiology. Cont home asa.  Anxiety disorder: Cont bupriprion.  BPH: Ensure he is Voiding well.  OSA on CPAP: Continue.  But has not been using CPAP for few days.  SNHL/Osteopenia: Cont supportive care.vitamin d,b12, repatha.  Hyperparathyroidism hx: Ca normal.  There is no height or weight on file to calculate BMI.   Severity of Illness: The appropriate patient status for this patient is INPATIENT. Inpatient status is judged to be reasonable and necessary in order to provide the required intensity of service to ensure the patient's safety. The patient's presenting symptoms, physical exam findings, and initial radiographic and laboratory data in the context of their chronic comorbidities is felt to place them at high risk for further clinical deterioration. Furthermore, it is not anticipated that the patient will  be medically stable for discharge from the hospital within 2 midnights of admission.    * I certify that at the point of admission it is my clinical judgment that the patient will require inpatient hospital care spanning beyond 2 midnights from the point of admission due to high intensity of service, high risk for further deterioration and high frequency of surveillance required.*   DVT prophylaxis: heparin bolus via infusion 5,000 Units Start: 01/26/24 1245 Code Status:   Code Status: Full Code  Family Communication: Admission, patients condition and plan of care including tests being ordered have been discussed with the patient and wife and daughter who indicate understanding and agree with the plan and Code Status.  Consults called:  none  Review of Systems: All systems were reviewed and were negative except as mentioned in HPI above. Negative for fever Negative for chest pain Negative for focal weakness  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   Flat feet    Glaucoma    left eye   History of colon polyps    History of hyperparathyroidism    Melanoma of back (HCC)    MR (mitral regurgitation) 05/16/2018   Moderate noted on ECHO   MVP (mitral valve prolapse) 05/16/2018   Noted on ECHO   OSA on CPAP    cpap set on 4   Osteopenia    resolved with medical therapy (calcium and fosamax)   PFO (patent foramen ovale)    Sensorineural hearing loss (SNHL) of both ears    Does Not wear HAs    Past Surgical History:  Procedure Laterality Date   APPLICATION OF A-CELL OF BACK N/A 12/19/2014   Procedure: APPLICATION OF A-CELL OF BACK;  Surgeon: Wayland Denis, DO;  Location: [facility];  Service: Plastics;  Laterality: N/A;   CHOLECYSTECTOMY  1996   COLONOSCOPY     EP IMPLANTABLE DEVICE N/A 07/02/2015   Procedure: Loop Recorder Insertion;  Surgeon: Hillis Range, MD;  Location: [facility];  Service: Cardiovascular;  Laterality: N/A;   GOLD  SEED IMPLANT N/A 07/29/2022   Procedure: GOLD SEED IMPLANT;  Surgeon: Heloise Purpura, MD;  Location: [facility];  Service: Urology;  Laterality: N/A;  ONLY NEEDS 30 MIN   implantable loop recorder removal  12/13/2018   MDT LINQ removed in office by Dr Johney Frame   LASIK  2003   MELANOMA EXCISION N/A 10/04/2014   Procedure: WIDE LOCAL EXCISION OF UPPER BACK MELANOMA ;  Surgeon: Atilano Ina, MD;  Location: [facility];  Service: General;  Laterality: N/A;   PARATHYROIDECTOMY  2004   SPACE OAR INSTILLATION N/A 07/29/2022   Procedure: SPACE OAR INSTILLATION;  Surgeon: Heloise Purpura, MD;  Location: [facility];  Service: Urology;  Laterality: N/A;   TEE WITHOUT CARDIOVERSION N/A 06/12/2015   Procedure: TRANSESOPHAGEAL ECHOCARDIOGRAM (TEE);  Surgeon: Wendall Stade, MD;  Location: [facility];  Service: Cardiovascular;  Laterality: N/A;   TONSILLECTOMY     as child   TOTAL KNEE ARTHROPLASTY Left 12/22/2018   Procedure: TOTAL KNEE ARTHROPLASTY;  Surgeon: Jodi Geralds, MD;  Location: [facility];  Service: Orthopedics;  Laterality: Left;   TOTAL KNEE ARTHROPLASTY Right 01/23/2024   Procedure: RIGHT TOTAL KNEE ARTHROPLASTY;  Surgeon: Jodi Geralds, MD;  Location: [facility];  Service: Orthopedics;  Laterality: Right;     reports that he  quit smoking about 52 years ago. His smoking use included cigarettes. He started smoking about 58 years ago. He has a 6 pack-year smoking history. He has never used smokeless tobacco. He reports current alcohol use of about 2.0 standard drinks of alcohol per week. He reports that he does not use drugs.  Allergies  Allergen Reactions   Atorvastatin Other (See Comments)    Aches in legs   Codeine Other (See Comments)    Headache.    Erythromycin     Childhood allergy - unknown   Rosuvastatin Calcium     Memory problem (03/2020) Aches in   legs    Family History  Problem Relation Age of Onset   Heart Problems Mother        valve replacement &  pacemaker   Osteoporosis Father    Other Maternal Grandfather        poss MI, per pt     Prior to Admission medications   Medication Sig Start Date End Date Taking? Authorizing Provider  aspirin EC 81 MG tablet Take 1 tablet (81 mg total) by mouth 2 (two) times daily. 01/23/24   Allena Katz, PA-C  buPROPion [facility] SR) 100 MG 12 hr tablet Take 100 mg by mouth at bedtime. 10/28/23   [provider]  Cholecalciferol (D2000 ULTRA STRENGTH) 50 MCG (2000 UT) CAPS Take 2,000 Units by mouth daily.    [provider]  cyanocobalamin (VITAMIN B12) 1000 MCG tablet Take 1,000 mcg by mouth every morning.    [provider]  diphenhydramine-acetaminophen (TYLENOL PM) 25-500 MG TABS tablet Take 2 tablets by mouth at bedtime.    [provider]  Multiple Minerals-Vitamins (CAL-MAG-ZINC-D PO) Take 1 tablet by mouth daily.    [provider]  Multiple Vitamin (MULTIVITAMIN) capsule Take 1 capsule by mouth daily.    [provider]  Omega-3 Fatty Acids (OMEGA 3 PO) Take 1 capsule by mouth every morning.    [provider]  oxyCODONE (ROXICODONE) 5 MG immediate release tablet Take 1 tablet (5 mg total) by mouth every 4 (four) hours as needed for severe pain (pain score 7-10). 01/23/24   Allena Katz, PA-C  REPATHA SURECLICK 140 MG/ML SOAJ ADMINISTER 1 ML UNDER THE SKIN EVERY 14 DAYS 11/28/23   Wendall Stade, MD  tadalafil (CIALIS) 20 MG tablet Take 20 mg by mouth daily as needed for erectile dysfunction.    [provider]  tiZANidine (ZANAFLEX) 2 MG tablet Take 1 tablet (2 mg total) by mouth every 6 (six) hours as needed for muscle spasms. 01/23/24   Allena Katz, PA-C  triamcinolone (KENALOG) 0.1 % paste Use as directed 1 application in the mouth or throat 2 (two) times daily as needed (mouth sores).    [provider]  Vitamin K, Phytonadione, 100 MCG TABS Take 100 mcg by mouth daily.     [provider]    Physical Exam: Vitals:   01/26/24 0937 01/26/24 0941 01/26/24 1306  BP:  (!) 143/73 (!) 141/78  Pulse: 85  91  Resp: 19  (!) 24  Temp: 97.9 F (36.6 C)  98.4 F (36.9 C)  TempSrc: Oral  Oral  SpO2: 95%  93%    General exam: AAO x3, NAD, weak appearing. HEENT:Oral mucosa moist, Ear/Nose WNL grossly, dentition normal. Respiratory system: bilaterally clear,no wheezing or crackles,no use of accessory muscle Cardiovascular system: S1 & S2 +, No JVD,. Gastrointestinal system: Abdomen soft, moderately distended bowel sounds  present nontender Nervous System:Alert, awake, moving extremities and grossly nonfocal Extremities: No edema, RT KNEE W/ Aquacel dressing in place with mild redness, without significant tenderness, distal peripheral pulses palpable.  Skin: No rashes,no icterus. MSK: Normal muscle bulk,tone, power   Labs on Admission: I have personally reviewed following labs and imaging studies  CBC: Recent Labs  Lab 01/26/24 0951  WBC 12.4*  NEUTROABS 11.0*  HGB 12.7*  HCT 37.0*  MCV 89.6  PLT 318   Basic Metabolic Panel: Recent Labs  Lab 01/26/24 0951  NA 137  K 3.9  CL 99  CO2 24  GLUCOSE 139*  BUN 17  CREATININE 0.80  CALCIUM 9.4   Estimated Creatinine Clearance: 84.9 mL/min (by C-G formula based on SCr of 0.8 mg/dL). Recent Labs  Lab 01/26/24 0951  AST 31  ALT 20  ALKPHOS 61  BILITOT 2.2*  PROT 8.1  ALBUMIN 3.8   Recent Labs  Lab 01/26/24 0951  LIPASE 28  Urine analysis:    Component Value Date/Time   COLORURINE YELLOW 01/26/2024 1308   APPEARANCEUR CLEAR 01/26/2024 1308   LABSPEC 1.025 01/26/2024 1308   PHURINE 6.0 01/26/2024 1308   GLUCOSEU NEGATIVE 01/26/2024 1308   HGBUR NEGATIVE 01/26/2024 1308   BILIRUBINUR NEGATIVE 01/26/2024 1308   KETONESUR 20 (A) 01/26/2024 1308   PROTEINUR 100 (A) 01/26/2024 1308   NITRITE NEGATIVE 01/26/2024 1308   LEUKOCYTESUR NEGATIVE 01/26/2024 1308    Radiological Exams on Admission: CT Angio  Chest PE W and/or Wo Contrast Addendum Date: 01/26/2024 ADDENDUM REPORT: 01/26/2024 11:59 ADDENDUM: Findings discussed by telephone with Dr. Maple Hudson 11:55 a.m., 01/26/2024. By report, patient underwent right knee arthroplasty 2 days ago. Electronically Signed   By: Jearld Lesch M.D.   On: 01/26/2024 11:59   Result Date: 01/26/2024 CLINICAL DATA:  PE suspected, nausea, vomiting, constipation, shortness of breath, history of prostate cancer * Tracking Code: BO * EXAM: CT ANGIOGRAPHY CHEST CT ABDOMEN AND PELVIS WITH CONTRAST TECHNIQUE: Multidetector CT imaging of the chest was performed using the standard protocol during bolus administration of intravenous contrast. Multiplanar CT image reconstructions and MIPs were obtained to evaluate the vascular anatomy. Multidetector CT imaging of the abdomen and pelvis was performed using the standard protocol during bolus administration of intravenous contrast. RADIATION DOSE REDUCTION: This exam was performed according to the departmental dose-optimization program which includes automated exposure control, adjustment of the mA and/or kV according to patient size and/or use of iterative reconstruction technique. CONTRAST:  OMNIPAQUE IOHEXOL 350 MG/ML SOLN COMPARISON:  PET-CT, 03/18/2022 FINDINGS: CT CHEST ANGIOGRAM FINDINGS Cardiovascular: Satisfactory opacification of the pulmonary arteries to the segmental level. Positive examination for pulmonary embolism with a single focus of segmental to subsegmental embolus in the posterior right upper lobe (series 4, image 57). Cardiomegaly. Three-vessel coronary artery calcifications. No pericardial effusion. Mediastinum/Nodes: No enlarged mediastinal, hilar, or axillary lymph nodes. Thyroid gland, trachea, and esophagus demonstrate no significant findings. Lungs/Pleura: Bibasilar scarring and volume loss, with elevation of the right hemidiaphragm. No pleural effusion or pneumothorax. Musculoskeletal: No chest wall abnormality.  No acute osseous findings. Review of the MIP images confirms the above findings. CT ABDOMEN PELVIS FINDINGS Hepatobiliary: No focal liver abnormality is seen. Status post cholecystectomy. No biliary dilatation. Pancreas: Unremarkable. No pancreatic ductal dilatation or surrounding inflammatory changes. Spleen: Normal in size without significant abnormality. Adrenals/Urinary Tract: Adrenal glands are unremarkable. Kidneys are normal, without renal calculi, solid lesion, or hydronephrosis. Bladder is unremarkable. Stomach/Bowel: Stomach is within normal limits. Appendix appears normal. The mid  small bowel is air and fluid-filled, although not overtly distended, loops measuring up to 3.7 cm in caliber without abrupt transition point, and scattered gas and stool present throughout the colon to the rectum. Vascular/Lymphatic: Aortic atherosclerosis. No enlarged abdominal or pelvic lymph nodes. Reproductive: Prostatomegaly with biopsy marking clips or fiducials. Other: No abdominal wall hernia or abnormality. No ascites. Musculoskeletal: No acute osseous findings. Soft tissue stranding and gas loculations noted in the partially imaged proximal right vastus musculature (series 2, image 103). IMPRESSION: 1. Positive examination for pulmonary embolism with a single focus of segmental to subsegmental embolus in the posterior right upper lobe. No evidence of right heart strain. 2. The mid small bowel is air and fluid-filled, although not overtly distended, loops measuring up to 3.7 cm in caliber without abrupt transition point, and scattered gas and stool present throughout the colon to the rectum. Findings are most consistent with ileus or enteritis. 3. Soft tissue stranding and gas loculations noted in the partially imaged proximal right vastus musculature. This is of uncertain significance, however concerning for gas-forming infection in the absence of recent surgical procedure or other explanation. Consider dedicated  imaging of the right lower extremity. 4. Prostatomegaly with biopsy marking clips or fiducials. No evidence of lymphadenopathy or metastatic disease in the abdomen or pelvis. 5. Cardiomegaly and coronary artery disease. Call report request was placed at the time of interpretation. Report issued at this time in the interest of expediency. Final communication of critical findings will be documented. Aortic Atherosclerosis (ICD10-I70.0). Electronically Signed: By: Jearld Lesch M.D. On: 01/26/2024 11:49   CT ABDOMEN PELVIS W CONTRAST Addendum Date: 01/26/2024 ADDENDUM REPORT: 01/26/2024 11:59 ADDENDUM: Findings discussed by telephone with Dr. Maple Hudson 11:55 a.m., 01/26/2024. By report, patient underwent right knee arthroplasty 2 days ago. Electronically Signed   By: Jearld Lesch M.D.   On: 01/26/2024 11:59   Result Date: 01/26/2024 CLINICAL DATA:  PE suspected, nausea, vomiting, constipation, shortness of breath, history of prostate cancer * Tracking Code: BO * EXAM: CT ANGIOGRAPHY CHEST CT ABDOMEN AND PELVIS WITH CONTRAST TECHNIQUE: Multidetector CT imaging of the chest was performed using the standard protocol during bolus administration of intravenous contrast. Multiplanar CT image reconstructions and MIPs were obtained to evaluate the vascular anatomy. Multidetector CT imaging of the abdomen and pelvis was performed using the standard protocol during bolus administration of intravenous contrast. RADIATION DOSE REDUCTION: This exam was performed according to the departmental dose-optimization program which includes automated exposure control, adjustment of the mA and/or kV according to patient size and/or use of iterative reconstruction technique. CONTRAST:  OMNIPAQUE IOHEXOL 350 MG/ML SOLN COMPARISON:  PET-CT, 03/18/2022 FINDINGS: CT CHEST ANGIOGRAM FINDINGS Cardiovascular: Satisfactory opacification of the pulmonary arteries to the segmental level. Positive examination for pulmonary embolism with a single  focus of segmental to subsegmental embolus in the posterior right upper lobe (series 4, image 57). Cardiomegaly. Three-vessel coronary artery calcifications. No pericardial effusion. Mediastinum/Nodes: No enlarged mediastinal, hilar, or axillary lymph nodes. Thyroid gland, trachea, and esophagus demonstrate no significant findings. Lungs/Pleura: Bibasilar scarring and volume loss, with elevation of the right hemidiaphragm. No pleural effusion or pneumothorax. Musculoskeletal: No chest wall abnormality. No acute osseous findings. Review of the MIP images confirms the above findings. CT ABDOMEN PELVIS FINDINGS Hepatobiliary: No focal liver abnormality is seen. Status post cholecystectomy. No biliary dilatation. Pancreas: Unremarkable. No pancreatic ductal dilatation or surrounding inflammatory changes. Spleen: Normal in size without significant abnormality. Adrenals/Urinary Tract: Adrenal glands are unremarkable. Kidneys are normal, without renal calculi,  solid lesion, or hydronephrosis. Bladder is unremarkable. Stomach/Bowel: Stomach is within normal limits. Appendix appears normal. The mid small bowel is air and fluid-filled, although not overtly distended, loops measuring up to 3.7 cm in caliber without abrupt transition point, and scattered gas and stool present throughout the colon to the rectum. Vascular/Lymphatic: Aortic atherosclerosis. No enlarged abdominal or pelvic lymph nodes. Reproductive: Prostatomegaly with biopsy marking clips or fiducials. Other: No abdominal wall hernia or abnormality. No ascites. Musculoskeletal: No acute osseous findings. Soft tissue stranding and gas loculations noted in the partially imaged proximal right vastus musculature (series 2, image 103). IMPRESSION: 1. Positive examination for pulmonary embolism with a single focus of segmental to subsegmental embolus in the posterior right upper lobe. No evidence of right heart strain. 2. The mid small bowel is air and fluid-filled,  although not overtly distended, loops measuring up to 3.7 cm in caliber without abrupt transition point, and scattered gas and stool present throughout the colon to the rectum. Findings are most consistent with ileus or enteritis. 3. Soft tissue stranding and gas loculations noted in the partially imaged proximal right vastus musculature. This is of uncertain significance, however concerning for gas-forming infection in the absence of recent surgical procedure or other explanation. Consider dedicated imaging of the right lower extremity. 4. Prostatomegaly with biopsy marking clips or fiducials. No evidence of lymphadenopathy or metastatic disease in the abdomen or pelvis. 5. Cardiomegaly and coronary artery disease. Call report request was placed at the time of interpretation. Report issued at this time in the interest of expediency. Final communication of critical findings will be documented. Aortic Atherosclerosis (ICD10-I70.0). Electronically Signed: By: Jearld Lesch M.D. On: 01/26/2024 11:49   Lanae Boast MD [facility]  If 7PM-7AM, please contact night-coverage [email or web address]  01/26/2024, 2:08 PM

## 2024-01-26 NOTE — Hospital Course (Addendum)
 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 He was also complained of shortness of breath today morning without chest pain. He is passing flatus. He had vomiting x 2 yesterday and none since.  He has not been moving much since surgery, has been taking aspirin return twice daily for DVT prophylaxis postop.  Patient already denies fever chills focal weakness, leg edema In the ED: Vital stable saturating 93-90% on room air.  Labs reviewed mild leukocytosis  otherwise stable, Underwent CT angio chest PE and CT abdomen pelvis: Positive for PE with a single focus of segmental to subsegmental embolus in the posterior  RUL, The mid small bowel is air and fluid-filled, although not overtly distended, loops measuring up to 3.7 cm in caliber without abrupt transition point, and scattered gas and stool present throughout the colon to the rectum. Findings are most consistent with ileus or enteritis.  Soft tissue stranding and gas loculations noted in the partially imaged proximal right vastus musculature. This is of uncertain significance, however concerning for gas-forming infection in the absence of recent surgical procedure or other explanation-but later reported attended indicating recent surgical procedure with right knee arthroplasty likely the cause for the gas.Prostatomegaly with biopsy marking clips or fiducials. Patient was placed on heparin, ivf antiemetics and admission was requested for PE and Ileus management.

## 2024-01-26 NOTE — Progress Notes (Signed)
BLE venous duplex has been completed.  Preliminary results given to Dr. Jonathon Bellows.   Results can be found under chart review under CV PROC. 01/26/2024 4:35 PM Gerardine Peltz RVT, RDMS

## 2024-01-27 ENCOUNTER — Telehealth (HOSPITAL_COMMUNITY): Payer: Self-pay | Admitting: Pharmacy Technician

## 2024-01-27 ENCOUNTER — Inpatient Hospital Stay (HOSPITAL_COMMUNITY): Payer: Medicare Other

## 2024-01-27 ENCOUNTER — Other Ambulatory Visit (HOSPITAL_COMMUNITY): Payer: Self-pay

## 2024-01-27 ENCOUNTER — Telehealth: Payer: Self-pay | Admitting: Cardiovascular Disease

## 2024-01-27 DIAGNOSIS — I2699 Other pulmonary embolism without acute cor pulmonale: Secondary | ICD-10-CM | POA: Diagnosis not present

## 2024-01-27 DIAGNOSIS — I2609 Other pulmonary embolism with acute cor pulmonale: Secondary | ICD-10-CM

## 2024-01-27 DIAGNOSIS — K567 Ileus, unspecified: Secondary | ICD-10-CM | POA: Diagnosis not present

## 2024-01-27 LAB — CBC
HCT: 32.5 % — ABNORMAL LOW (ref 39.0–52.0)
Hemoglobin: 10.6 g/dL — ABNORMAL LOW (ref 13.0–17.0)
MCH: 30.5 pg (ref 26.0–34.0)
MCHC: 32.6 g/dL (ref 30.0–36.0)
MCV: 93.4 fL (ref 80.0–100.0)
Platelets: 308 10*3/uL (ref 150–400)
RBC: 3.48 MIL/uL — ABNORMAL LOW (ref 4.22–5.81)
RDW: 12.4 % (ref 11.5–15.5)
WBC: 9.1 10*3/uL (ref 4.0–10.5)
nRBC: 0 % (ref 0.0–0.2)

## 2024-01-27 LAB — BASIC METABOLIC PANEL
Anion gap: 12 (ref 5–15)
BUN: 17 mg/dL (ref 8–23)
CO2: 23 mmol/L (ref 22–32)
Calcium: 8.2 mg/dL — ABNORMAL LOW (ref 8.9–10.3)
Chloride: 99 mmol/L (ref 98–111)
Creatinine, Ser: 0.71 mg/dL (ref 0.61–1.24)
GFR, Estimated: >60 mL/min (ref >60)
Glucose, Bld: 128 mg/dL — ABNORMAL HIGH (ref 70–99)
Potassium: 3.3 mmol/L — ABNORMAL LOW (ref 3.5–5.1)
Sodium: 134 mmol/L — ABNORMAL LOW (ref 135–145)

## 2024-01-27 LAB — ECHOCARDIOGRAM COMPLETE
Area-P 1/2: 3.85 cm2
Height: 72 in
S' Lateral: 2.8 cm
Weight: 3024.01 [oz_av]

## 2024-01-27 LAB — HEPARIN LEVEL (UNFRACTIONATED)
Heparin Unfractionated: 0.4 [IU]/mL (ref 0.30–0.70)
Heparin Unfractionated: 0.29 [IU]/mL — ABNORMAL LOW (ref 0.30–0.70)

## 2024-01-27 MED ORDER — ORAL CARE MOUTH RINSE: 15.0000 mL | OROMUCOSAL | Status: DC | PRN

## 2024-01-27 MED ORDER — PROCHLORPERAZINE EDISYLATE 10 MG/2ML IJ SOLN
5.0000 mg | Freq: Four times a day (QID) | INTRAMUSCULAR | Status: AC | PRN
  Administered 2024-01-27 (×2): 5 mg via INTRAVENOUS
  Filled 2024-01-27 (×2): qty 2

## 2024-01-27 MED ORDER — HEPARIN BOLUS VIA INFUSION
2500.0000 [IU] | Freq: Once | INTRAVENOUS | Status: AC
  Administered 2024-01-27: 2500 [IU] via INTRAVENOUS
  Filled 2024-01-27: qty 2500

## 2024-01-27 NOTE — Telephone Encounter (Signed)
Pt's wife calling to make provider aware that ppt has been admitted to [facility] due to blood clots. Please advise

## 2024-01-27 NOTE — Plan of Care (Signed)

## 2024-01-27 NOTE — Progress Notes (Signed)
PHARMACY - ANTICOAGULATION CONSULT NOTE  Pharmacy Consult for Heparin Indication: pulmonary embolus  Allergies  Allergen Reactions   Atorvastatin Other (See Comments)    Aches in legs   Codeine Other (See Comments)    Headache.    Erythromycin     Childhood allergy - unknown   Rosuvastatin Calcium     Memory problem (03/2020) Aches in   legs    Patient Measurements: Height: 6' (182.9 cm) Weight: 85.7 kg (189 lb) IBW/kg (Calculated) : 77.6 Heparin Dosing Weight: 85 kg   Vital Signs: Temp: 98.4 F (36.9 C) (02/21 1710) Temp Source: Oral (02/21 1710) BP: 133/68 (02/21 1710) Pulse Rate: 80 (02/21 1710)  Labs: Recent Labs    01/26/24 0951 01/26/24 2321 01/27/24 0602 01/27/24 0830 01/27/24 1614  HGB 12.7*  --  10.6*  --   --   HCT 37.0*  --  32.5*  --   --   PLT 318  --  308  --   --   HEPARINUNFRC  --  0.19*  --  0.40 0.29*  CREATININE 0.80  --  0.71  --   --     Estimated Creatinine Clearance: 84.9 mL/min (by C-G formula based on SCr of 0.71 mg/dL).   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   Flat feet    Glaucoma    left eye   History of colon polyps    History of hyperparathyroidism    Melanoma of back (HCC)    MR (mitral regurgitation) 05/16/2018   Moderate noted on ECHO   MVP (mitral valve prolapse) 05/16/2018   Noted on ECHO   OSA on CPAP    cpap set on 4   Osteopenia    resolved with medical therapy (calcium and fosamax)   PFO (patent foramen ovale)    Sensorineural hearing loss (SNHL) of both ears    Does Not wear HAs    Medications:  Scheduled:   aspirin EC  81 mg Oral Daily   buPROPion ER  100 mg Oral QHS   cholecalciferol  2,000 Units Oral Daily   cyanocobalamin  1,000 mcg Oral Daily   multivitamin with minerals  1 tablet Oral Daily   omega-3 acid ethyl esters  1,000 mg Oral Daily   polyethylene glycol  17 g  Oral BID   Infusions:   heparin 1,650 Units/hr (01/27/24 1205)     Assessment: 51 yoM presented to ED on 2/20 for N/V, constipation, and SOB.  CTa positive for PE.  Noted recent total right knee arthroplasty procedure on 2/17 - discharged on Aspirin 81mg  PO BID. Pharmacy is consulted for IV heparin dosing.  Baseline INR, aPTT: WNL Prior anticoagulation: none  Today, 01/27/2024:  -Heparin level 0.29 - now subtherapeutic on heparin infusion at 1650 units/hr -No interruptions or line issues per discussion with RN -Noted to be on daily aspirin for previous crytogenic stroke & hx of PFO/MR  Goal of Therapy:  Heparin level 0.3-0.7 units/ml  Monitor platelets by anticoagulation protocol: Yes   Plan:  -Increase heparin infusion to 1750 units/hr  -Check heparin level ~ 8 hours after rate change -Daily CBC -Monitor for signs of bleeding or thrombosis   Pricilla Riffle, PharmD, BCPS Clinical Pharmacist 01/27/2024 5:17 PM

## 2024-01-27 NOTE — Progress Notes (Signed)
Patient had a couple of hard formed large brown BMs this shift, given laxatives and prn suppository, fluids encouraged and tolerated well, given x 1 dose of Zofran with eff results, continued on Heparin, no ASE noted, denies CP, palpitations, right knee island dressing CDI, medicated with prn Tylenol and x 1 dose of Robaxin with eff results, updated family on POC at bedside

## 2024-01-27 NOTE — Progress Notes (Signed)
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 He was also complained of shortness of breath today morning without chest pain. He is passing flatus. He had vomiting x 2 yesterday and none since.  He has not been moving much since surgery, has been taking aspirin return twice daily for DVT prophylaxis postop.  Patient already denies fever chills focal weakness, leg edema  In the ED: Vital stable saturating 93-90% on room air.  Labs reviewed mild leukocytosis  otherwise stable, Underwent CT angio chest PE and CT abdomen pelvis: Positive for PE with a single focus of segmental to subsegmental embolus in the posterior  RUL, The mid small bowel is air and fluid-filled, although not overtly distended, loops measuring up to 3.7 cm in caliber without abrupt transition point, and scattered gas and stool present throughout the colon to the rectum. Findings are most consistent with ileus or enteritis.  Soft tissue stranding and gas loculations noted in the partially imaged proximal right vastus musculature. This is of uncertain significance, however concerning for gas-forming infection in the absence of recent surgical procedure or other explanation-but later reported attended indicating recent surgical procedure with right knee arthroplasty likely the cause for the gas.Prostatomegaly with biopsy marking clips or fiducials. Patient was placed on heparin, ivf antiemetics and admission was requested.  Subjective: Patient seen and examined this morning Patient  is resting comfortably Patient had a good bowel movement having some nausea no vomiting. On room air.  Labs showed mild hypokalemia anemia 10.6 g    Assessment and Plan: Principal Problem:   Acute pulmonary embolism (HCC) Active Problems:   Cryptogenic stroke (HCC)   Primary osteoarthritis of left knee   Malignant neoplasm of prostate (HCC)   Hearing loss   Hypercholesterolemia   Osteopenia   Ileus (HCC)  Acute PE segmental to subsegmental RUL Acute DVT of right popliteal and right posterior tibial vein: VTE suspected to be 2/2 recent knee surgery on 2/17  and immobility despite being on aspirin twice daily.  Managing with heparin drip and will transition to DOAC one ileus better.  Follow echocardiogram  Ileus Constipation  Nausea vomiting abdomen pain concerning for ileus.CT abdomen no acute finding.  Patient did have a bowel movement we will continue MiraLAX twice daily, Dulcolax PR.  X-ray abdomen this morning multiple dilated small bowel loops consistent with partial obstruction versus adynamic ileus. Has nauseas, cont on CLD xray In am.   Mild leucocytosis: Anemia hemoglobin drifting Monitor labs.  WBC improved  Right knee arthroplasty 01/23/24 Ptot and pain control-has not been taking narcotics on hold, continue Tylenol, muscle relaxant   Cryptogenic stroke in April 2016 history of PFO/MR: followed by cardiology. Cont home asa.   Anxiety disorder: Cont bupriprion.   BPH: Ensure he is Voiding well.   OSA on CPAP: Continue.  But has not been using CPAP for few days.   SNHL/Osteopenia: Cont supportive care.vitamin d,b12, repatha.   Hyperparathyroidism hx: Ca normal.   DVT prophylaxis: Heparin Code Status:   Code Status: Full Code Family Communication: plan of care discussed with patient/wife at bedside. Patient status is: Remains hospitalized because of severity of  illness Level of care: Telemetry   Dispo: The patient is from: Home            Anticipated  disposition: TBD.  Will obtain PT OT evaluation Objective: Vitals last 24 hrs: Vitals:   01/27/24 1100 01/27/24 1153 01/27/24 1200 01/27/24 1300  BP:      Pulse:      Resp: 13 19 17 16   Temp:      TempSrc:      SpO2:      Weight:      Height:       Weight change:   Physical Examination: General exam: alert awake,at baseline, older than stated age HEENT:Oral mucosa moist, Ear/Nose WNL grossly Respiratory system: Bilaterally clear BS,no use of accessory muscle Cardiovascular system: S1 & S2 +, No JVD. Gastrointestinal system: Abdomen soft,mildly distended, NT, BS+ Nervous System: Alert, awake, moving all extremities,and following commands. Extremities: LE edema neg RT KNEE W/ AQUACEL Dressing+ ,distal peripheral pulses palpable and warm.  Skin: No rashes,no icterus. MSK: Normal muscle bulk,tone, power   Medications reviewed:  Scheduled Meds:  aspirin EC  81 mg Oral Daily   buPROPion ER  100 mg Oral QHS   cholecalciferol  2,000 Units Oral Daily   cyanocobalamin  1,000 mcg Oral Daily   multivitamin with minerals  1 tablet Oral Daily   omega-3 acid ethyl esters  1,000 mg Oral Daily   polyethylene glycol  17 g Oral BID   Continuous Infusions:  heparin 1,650 Units/hr (01/27/24 1205)   lactated ringers 75 mL/hr at 01/27/24 1205      Diet Order             Diet clear liquid Room service appropriate? Yes; Fluid consistency: Thin  Diet effective now                            Intake/Output Summary (Last 24 hours) at 01/27/2024 1346 Last data filed at 01/27/2024 1205 Gross per 24 hour  Intake 2362.73 ml  Output 251 ml  Net 2111.73 ml   Net IO Since Admission: 3,111.73 mL [01/27/24 1346]  Wt Readings from Last 3 Encounters:  01/26/24 85.7 kg  01/23/24 85.7 kg  01/10/24 85.7 kg     Unresulted Labs (From admission, onward)     Start     Ordered   01/27/24 0500  Basic metabolic panel  Daily,   R      01/26/24 1335   01/27/24 0500  CBC  Daily,   R       01/26/24 1335           Data Reviewed: I have personally reviewed following labs and imaging studies CBC: Recent Labs  Lab 01/26/24 0951 01/27/24 0602  WBC 12.4* 9.1  NEUTROABS 11.0*  --   HGB 12.7* 10.6*  HCT 37.0* 32.5*  MCV 89.6 93.4  PLT 318 308   Basic Metabolic Panel:  Recent Labs  Lab 01/26/24 0951 01/27/24 0602  NA 137 134*  K 3.9 3.3*  CL 99 99  CO2 24 23  GLUCOSE 139* 128*  BUN 17 17  CREATININE 0.80 0.71  CALCIUM 9.4 8.2*   GFR: Estimated Creatinine Clearance: 84.9 mL/min (by C-G formula based on SCr of 0.71 mg/dL). Liver Function Tests:  Recent Labs  Lab 01/26/24 0951  AST 31  ALT 20  ALKPHOS 61  BILITOT 2.2*  PROT 8.1  ALBUMIN 3.8   Recent Labs  Lab 01/26/24 [phone]  LIPASE 28   Sepsis Labs: No results for input(s): "PROCALCITON", "LATICACIDVEN" in the last 168 hours. Recent Results (from the past 240 hours)  Resp panel by RT-PCR (RSV, Flu A&B, Covid) Anterior Nasal Swab     Status: None   Collection Time: 01/26/24  9:38 AM   Specimen: Anterior Nasal Swab  Result Value Ref Range Status   SARS Coronavirus 2 by RT PCR NEGATIVE NEGATIVE Final    Comment: (NOTE) SARS-CoV-2 target nucleic acids are NOT DETECTED.  The SARS-CoV-2 RNA is generally detectable in upper respiratory specimens during the acute phase of infection. The lowest concentration of SARS-CoV-2 viral copies this assay can detect is 138 copies/mL. A negative result does not preclude SARS-Cov-2 infection and should not be used as the sole basis for treatment or other patient management decisions. A negative result may occur with  improper specimen collection/handling, submission of specimen other than nasopharyngeal swab, presence of viral mutation(s) within the areas targeted by this assay, and inadequate number of viral copies(<138 copies/mL). A negative result must be combined with clinical observations, patient history, and epidemiological information. The expected result  is Negative.  Fact Sheet for Patients:  BloggerCourse.com  Fact Sheet for Healthcare Providers:  SeriousBroker.it  This test is no t yet approved or cleared by the [place] FDA and  has been authorized for detection and/or diagnosis of SARS-CoV-2 by FDA under an Emergency Use Authorization (EUA). This EUA will remain  in effect (meaning this test can be used) for the duration of the COVID-19 declaration under Section 564(b)(1) of the Act, 21 U.S.C.section 360bbb-3(b)(1), unless the authorization is terminated  or revoked sooner.       Influenza A by PCR NEGATIVE NEGATIVE Final   Influenza B by PCR NEGATIVE NEGATIVE Final    Comment: (NOTE) The Xpert Xpress SARS-CoV-2/FLU/RSV plus assay is intended as an aid in the diagnosis of influenza from Nasopharyngeal swab specimens and should not be used as a sole basis for treatment. Nasal washings and aspirates are unacceptable for Xpert Xpress SARS-CoV-2/FLU/RSV testing.  Fact Sheet for Patients: BloggerCourse.com  Fact Sheet for Healthcare Providers: SeriousBroker.it  This test is not yet approved or cleared by the [place] FDA and has been authorized for detection and/or diagnosis of SARS-CoV-2 by FDA under an Emergency Use Authorization (EUA). This EUA will remain in effect (meaning this test can be used) for the duration of the COVID-19 declaration under Section 564(b)(1) of the Act, 21 U.S.C. section 360bbb-3(b)(1), unless the authorization is terminated or revoked.     Resp Syncytial Virus by PCR NEGATIVE NEGATIVE Final    Comment: (NOTE) Fact Sheet for Patients: BloggerCourse.com  Fact Sheet for Healthcare Providers: SeriousBroker.it  This test is not yet approved or cleared by the [place] FDA and has been authorized for detection and/or diagnosis of  SARS-CoV-2 by FDA under an Emergency Use Authorization (EUA). This EUA will remain in effect (meaning this test can be used) for the duration of the COVID-19 declaration under Section 564(b)(1) of the Act, 21 U.S.C. section 360bbb-3(b)(1), unless the authorization is terminated or revoked.  Performed at [facility], [address], [place], [place] [address]     Antimicrobials/Microbiology: Anti-infectives (From admission, onward)    None      No results found for: "SDES", "SPECREQUEST", "CULT", "REPTSTATUS"   Radiology Studies: DG Abd 1 View Result Date: 01/27/2024 CLINICAL DATA:  Follow-up ileus. EXAM: ABDOMEN - 1 VIEW COMPARISON:  CT, 01/26/2024 FINDINGS: Multiple loops of dilated  small bowel are consistent with a partial obstruction versus small bowel adynamic ileus, without change from the previous day's CT scan. IMPRESSION: No change from the previous day's CT scan. Multiple dilated small bowel loops consistent with a partial obstruction versus adynamic ileus. Electronically Signed   By: Amie [place] M.D.   On: 01/27/2024 09:02   VAS [place] LOWER EXTREMITY VENOUS (DVT) Result Date: 01/26/2024  Lower Venous DVT Study Patient Name:  James Tate  Date of Exam:   01/26/2024 Medical Rec #: [id number]        Accession #:    [id number] Date of Birth: 1946-01-19        Patient Gender: M Patient Age:   49 years Exam Location:  [facility] Procedure:      VAS [place] LOWER EXTREMITY VENOUS (DVT) Referring Phys: Leveon Pelzer --------------------------------------------------------------------------------  Indications: Pulmonary embolism.  Risk Factors: Surgery RLE TKA on 01/23/2024. Comparison Study: No previous exams Performing Technologist: Jody Hill RVT, RDMS  Examination Guidelines: A complete evaluation includes B-mode imaging, spectral Doppler, color Doppler, and power Doppler as needed of all accessible portions of each vessel. Bilateral testing is considered an integral  part of a complete examination. Limited examinations for reoccurring indications may be performed as noted. The reflux portion of the exam is performed with the patient in reverse Trendelenburg.  +---------+---------------+---------+-----------+----------+--------------+ RIGHT    CompressibilityPhasicitySpontaneityPropertiesThrombus Aging +---------+---------------+---------+-----------+----------+--------------+ CFV      Full           Yes      Yes                                 +---------+---------------+---------+-----------+----------+--------------+ SFJ      Full                                                        +---------+---------------+---------+-----------+----------+--------------+ FV Prox  Full           Yes      Yes                                 +---------+---------------+---------+-----------+----------+--------------+ FV Mid   Full           Yes      Yes                                 +---------+---------------+---------+-----------+----------+--------------+ FV DistalFull           Yes      Yes                                 +---------+---------------+---------+-----------+----------+--------------+ PFV      Full                                                        +---------+---------------+---------+-----------+----------+--------------+ POP      None  No       No                   Acute          +---------+---------------+---------+-----------+----------+--------------+ PTV      None           No       No                   Acute          +---------+---------------+---------+-----------+----------+--------------+ PERO     Full                                                        +---------+---------------+---------+-----------+----------+--------------+   +---------+---------------+---------+-----------+-------------+--------------+ LEFT     CompressibilityPhasicitySpontaneityProperties   Thrombus  Aging +---------+---------------+---------+-----------+-------------+--------------+ CFV      Full           Yes      Yes                                    +---------+---------------+---------+-----------+-------------+--------------+ SFJ      Full                                                           +---------+---------------+---------+-----------+-------------+--------------+ FV Prox  Full           Yes      Yes                                    +---------+---------------+---------+-----------+-------------+--------------+ FV Mid   Full           Yes      Yes                                    +---------+---------------+---------+-----------+-------------+--------------+ FV DistalFull           Yes      Yes                                    +---------+---------------+---------+-----------+-------------+--------------+ PFV      Full                                                           +---------+---------------+---------+-----------+-------------+--------------+ POP      Full           Yes      Yes        rouleaux flow               +---------+---------------+---------+-----------+-------------+--------------+ PTV      Full                                                           +---------+---------------+---------+-----------+-------------+--------------+  PERO     Full                                                           +---------+---------------+---------+-----------+-------------+--------------+     Summary: BILATERAL: -No evidence of popliteal cyst, bilaterally. RIGHT: - Findings consistent with acute deep vein thrombosis involving the right popliteal vein, and right posterior tibial veins.   LEFT: - There is no evidence of deep vein thrombosis in the lower extremity.  *See table(s) above for measurements and observations. Electronically signed by Heath Lark on 01/26/2024 at 5:03:07 PM.    Final    CT Angio Chest PE W  and/or Wo Contrast Addendum Date: 01/26/2024 ADDENDUM REPORT: 01/26/2024 11:59 ADDENDUM: Findings discussed by telephone with Dr. Maple Hudson 11:55 a.m., 01/26/2024. By report, patient underwent right knee arthroplasty 2 days ago. Electronically Signed   By: Jearld Lesch M.D.   On: 01/26/2024 11:59   Result Date: 01/26/2024 CLINICAL DATA:  PE suspected, nausea, vomiting, constipation, shortness of breath, history of prostate cancer * Tracking Code: BO * EXAM: CT ANGIOGRAPHY CHEST CT ABDOMEN AND PELVIS WITH CONTRAST TECHNIQUE: Multidetector CT imaging of the chest was performed using the standard protocol during bolus administration of intravenous contrast. Multiplanar CT image reconstructions and MIPs were obtained to evaluate the vascular anatomy. Multidetector CT imaging of the abdomen and pelvis was performed using the standard protocol during bolus administration of intravenous contrast. RADIATION DOSE REDUCTION: This exam was performed according to the departmental dose-optimization program which includes automated exposure control, adjustment of the mA and/or kV according to patient size and/or use of iterative reconstruction technique. CONTRAST:  OMNIPAQUE IOHEXOL 350 MG/ML SOLN COMPARISON:  PET-CT, 03/18/2022 FINDINGS: CT CHEST ANGIOGRAM FINDINGS Cardiovascular: Satisfactory opacification of the pulmonary arteries to the segmental level. Positive examination for pulmonary embolism with a single focus of segmental to subsegmental embolus in the posterior right upper lobe (series 4, image 57). Cardiomegaly. Three-vessel coronary artery calcifications. No pericardial effusion. Mediastinum/Nodes: No enlarged mediastinal, hilar, or axillary lymph nodes. Thyroid gland, trachea, and esophagus demonstrate no significant findings. Lungs/Pleura: Bibasilar scarring and volume loss, with elevation of the right hemidiaphragm. No pleural effusion or pneumothorax. Musculoskeletal: No chest wall abnormality. No acute  osseous findings. Review of the MIP images confirms the above findings. CT ABDOMEN PELVIS FINDINGS Hepatobiliary: No focal liver abnormality is seen. Status post cholecystectomy. No biliary dilatation. Pancreas: Unremarkable. No pancreatic ductal dilatation or surrounding inflammatory changes. Spleen: Normal in size without significant abnormality. Adrenals/Urinary Tract: Adrenal glands are unremarkable. Kidneys are normal, without renal calculi, solid lesion, or hydronephrosis. Bladder is unremarkable. Stomach/Bowel: Stomach is within normal limits. Appendix appears normal. The mid small bowel is air and fluid-filled, although not overtly distended, loops measuring up to 3.7 cm in caliber without abrupt transition point, and scattered gas and stool present throughout the colon to the rectum. Vascular/Lymphatic: Aortic atherosclerosis. No enlarged abdominal or pelvic lymph nodes. Reproductive: Prostatomegaly with biopsy marking clips or fiducials. Other: No abdominal wall hernia or abnormality. No ascites. Musculoskeletal: No acute osseous findings. Soft tissue stranding and gas loculations noted in the partially imaged proximal right vastus musculature (series 2, image 103). IMPRESSION: 1. Positive examination for pulmonary embolism with a single focus of segmental to subsegmental embolus in the posterior right upper lobe. No evidence of right heart strain.  2. The mid small bowel is air and fluid-filled, although not overtly distended, loops measuring up to 3.7 cm in caliber without abrupt transition point, and scattered gas and stool present throughout the colon to the rectum. Findings are most consistent with ileus or enteritis. 3. Soft tissue stranding and gas loculations noted in the partially imaged proximal right vastus musculature. This is of uncertain significance, however concerning for gas-forming infection in the absence of recent surgical procedure or other explanation. Consider dedicated imaging of the  right lower extremity. 4. Prostatomegaly with biopsy marking clips or fiducials. No evidence of lymphadenopathy or metastatic disease in the abdomen or pelvis. 5. Cardiomegaly and coronary artery disease. Call report request was placed at the time of interpretation. Report issued at this time in the interest of expediency. Final communication of critical findings will be documented. Aortic Atherosclerosis (ICD10-I70.0). Electronically Signed: By: Jearld Lesch M.D. On: 01/26/2024 11:49   CT ABDOMEN PELVIS W CONTRAST Addendum Date: 01/26/2024 ADDENDUM REPORT: 01/26/2024 11:59 ADDENDUM: Findings discussed by telephone with Dr. Maple Hudson 11:55 a.m., 01/26/2024. By report, patient underwent right knee arthroplasty 2 days ago. Electronically Signed   By: Jearld Lesch M.D.   On: 01/26/2024 11:59   Result Date: 01/26/2024 CLINICAL DATA:  PE suspected, nausea, vomiting, constipation, shortness of breath, history of prostate cancer * Tracking Code: BO * EXAM: CT ANGIOGRAPHY CHEST CT ABDOMEN AND PELVIS WITH CONTRAST TECHNIQUE: Multidetector CT imaging of the chest was performed using the standard protocol during bolus administration of intravenous contrast. Multiplanar CT image reconstructions and MIPs were obtained to evaluate the vascular anatomy. Multidetector CT imaging of the abdomen and pelvis was performed using the standard protocol during bolus administration of intravenous contrast. RADIATION DOSE REDUCTION: This exam was performed according to the departmental dose-optimization program which includes automated exposure control, adjustment of the mA and/or kV according to patient size and/or use of iterative reconstruction technique. CONTRAST:  OMNIPAQUE IOHEXOL 350 MG/ML SOLN COMPARISON:  PET-CT, 03/18/2022 FINDINGS: CT CHEST ANGIOGRAM FINDINGS Cardiovascular: Satisfactory opacification of the pulmonary arteries to the segmental level. Positive examination for pulmonary embolism with a single focus of  segmental to subsegmental embolus in the posterior right upper lobe (series 4, image 57). Cardiomegaly. Three-vessel coronary artery calcifications. No pericardial effusion. Mediastinum/Nodes: No enlarged mediastinal, hilar, or axillary lymph nodes. Thyroid gland, trachea, and esophagus demonstrate no significant findings. Lungs/Pleura: Bibasilar scarring and volume loss, with elevation of the right hemidiaphragm. No pleural effusion or pneumothorax. Musculoskeletal: No chest wall abnormality. No acute osseous findings. Review of the MIP images confirms the above findings. CT ABDOMEN PELVIS FINDINGS Hepatobiliary: No focal liver abnormality is seen. Status post cholecystectomy. No biliary dilatation. Pancreas: Unremarkable. No pancreatic ductal dilatation or surrounding inflammatory changes. Spleen: Normal in size without significant abnormality. Adrenals/Urinary Tract: Adrenal glands are unremarkable. Kidneys are normal, without renal calculi, solid lesion, or hydronephrosis. Bladder is unremarkable. Stomach/Bowel: Stomach is within normal limits. Appendix appears normal. The mid small bowel is air and fluid-filled, although not overtly distended, loops measuring up to 3.7 cm in caliber without abrupt transition point, and scattered gas and stool present throughout the colon to the rectum. Vascular/Lymphatic: Aortic atherosclerosis. No enlarged abdominal or pelvic lymph nodes. Reproductive: Prostatomegaly with biopsy marking clips or fiducials. Other: No abdominal wall hernia or abnormality. No ascites. Musculoskeletal: No acute osseous findings. Soft tissue stranding and gas loculations noted in the partially imaged proximal right vastus musculature (series 2, image 103). IMPRESSION: 1. Positive examination for pulmonary embolism with a single  focus of segmental to subsegmental embolus in the posterior right upper lobe. No evidence of right heart strain. 2. The mid small bowel is air and fluid-filled, although not  overtly distended, loops measuring up to 3.7 cm in caliber without abrupt transition point, and scattered gas and stool present throughout the colon to the rectum. Findings are most consistent with ileus or enteritis. 3. Soft tissue stranding and gas loculations noted in the partially imaged proximal right vastus musculature. This is of uncertain significance, however concerning for gas-forming infection in the absence of recent surgical procedure or other explanation. Consider dedicated imaging of the right lower extremity. 4. Prostatomegaly with biopsy marking clips or fiducials. No evidence of lymphadenopathy or metastatic disease in the abdomen or pelvis. 5. Cardiomegaly and coronary artery disease. Call report request was placed at the time of interpretation. Report issued at this time in the interest of expediency. Final communication of critical findings will be documented. Aortic Atherosclerosis (ICD10-I70.0). Electronically Signed: By: Jearld Lesch M.D. On: 01/26/2024 11:49     LOS: 1 day   Total time spent in review of labs and imaging, patient evaluation, formulation of plan, documentation and communication with family: 35 minutes  Lanae Boast, MD  [facility]  01/27/2024, 1:46 PM

## 2024-01-27 NOTE — Progress Notes (Signed)
PHARMACY - ANTICOAGULATION CONSULT NOTE  Pharmacy Consult for Heparin Indication: pulmonary embolus  Allergies  Allergen Reactions   Atorvastatin Other (See Comments)    Aches in legs   Codeine Other (See Comments)    Headache.    Erythromycin     Childhood allergy - unknown   Rosuvastatin Calcium     Memory problem (03/2020) Aches in   legs    Patient Measurements: Height: 6' (182.9 cm) Weight: 85.7 kg (189 lb) IBW/kg (Calculated) : 77.6 Heparin Dosing Weight: 85 kg   Vital Signs: Temp: 98.1 F (36.7 C) (02/21 0439) Temp Source: Oral (02/21 0439) BP: 137/70 (02/21 0439) Pulse Rate: 81 (02/21 0439)  Labs: Recent Labs    01/26/24 0951 01/26/24 2321 01/27/24 0602 01/27/24 0830  HGB 12.7*  --  10.6*  --   HCT 37.0*  --  32.5*  --   PLT 318  --  308  --   HEPARINUNFRC  --  0.19*  --  0.40  CREATININE 0.80  --  0.71  --     Estimated Creatinine Clearance: 84.9 mL/min (by C-G formula based on SCr of 0.71 mg/dL).   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   Flat feet    Glaucoma    left eye   History of colon polyps    History of hyperparathyroidism    Melanoma of back (HCC)    MR (mitral regurgitation) 05/16/2018   Moderate noted on ECHO   MVP (mitral valve prolapse) 05/16/2018   Noted on ECHO   OSA on CPAP    cpap set on 4   Osteopenia    resolved with medical therapy (calcium and fosamax)   PFO (patent foramen ovale)    Sensorineural hearing loss (SNHL) of both ears    Does Not wear HAs    Medications:  Scheduled:   aspirin EC  81 mg Oral Daily   buPROPion ER  100 mg Oral QHS   cholecalciferol  2,000 Units Oral Daily   cyanocobalamin  1,000 mcg Oral Daily   multivitamin with minerals  1 tablet Oral Daily   omega-3 acid ethyl esters  1,000 mg Oral Daily   polyethylene glycol  17 g Oral BID   Infusions:   heparin 1,650  Units/hr (01/27/24 0401)   lactated ringers 75 mL/hr at 01/27/24 0401     Assessment: 78 yoM presented to ED on 2/20 for N/V, constipation, and SOB.  CTa positive for PE.  Noted recent total right knee arthroplasty procedure on 2/17 - discharged on Aspirin 81mg  PO BID. Pharmacy is consulted for IV heparin dosing.   Baseline INR, aPTT: WNL Prior anticoagulation: none  Significant events: none  Today, 01/27/2024:  CBC: Hgb decreased from baseline, Plt WNL Most recent heparin level therapeutic on 1650 units/hr  No bleeding or infusion issues per nursing  Noted to be on daily aspirin for previous crytogenic stroke & hx of PFO/MR  Goal of Therapy:  Heparin level 0.3-0.7 units/ml  Monitor platelets by anticoagulation protocol: Yes   Plan:  Continue heparin IV infusion at 1650 units/hr  Agree to continue daily aspirin for previous crytogenic stroke & hx of PFO/MR Recheck confirmatory heparin level in 8 hrs  Daily CBC, daily heparin level once stable  Monitor for signs of bleeding or thrombosis   Frederic Jericho, PharmD Candidate 01/27/2024, 10:49 AM

## 2024-01-27 NOTE — Plan of Care (Signed)

## 2024-01-27 NOTE — Telephone Encounter (Signed)
Patient Product/process development scientist completed.    The patient is insured through [employer]. Patient has Medicare and is not eligible for a copay card, but may be able to apply for patient assistance or Medicare RX Payment Plan (Patient Must reach out to their plan, if eligible for payment plan), if available.    Ran test claim for Eliquis 5 mg and the current 30 day co-pay is $30.96.  Ran test claim for Xarelto 20 mg and the current 30 day co-pay is $30.96.  This test claim was processed through [facility]- copay amounts may vary at other pharmacies due to pharmacy/plan contracts, or as the patient moves through the different stages of their insurance plan.     Roland Earl, CPHT Pharmacy Technician III Certified Patient Advocate [facility] Patient Advocate Team Direct Number: [phone]  Fax: [phone]

## 2024-01-28 ENCOUNTER — Inpatient Hospital Stay (HOSPITAL_COMMUNITY): Payer: Medicare Other

## 2024-01-28 DIAGNOSIS — I2699 Other pulmonary embolism without acute cor pulmonale: Secondary | ICD-10-CM | POA: Diagnosis not present

## 2024-01-28 DIAGNOSIS — K567 Ileus, unspecified: Secondary | ICD-10-CM | POA: Diagnosis not present

## 2024-01-28 DIAGNOSIS — Z9049 Acquired absence of other specified parts of digestive tract: Secondary | ICD-10-CM | POA: Diagnosis not present

## 2024-01-28 LAB — BASIC METABOLIC PANEL
Anion gap: 10 (ref 5–15)
BUN: 16 mg/dL (ref 8–23)
CO2: 27 mmol/L (ref 22–32)
Calcium: 8.5 mg/dL — ABNORMAL LOW (ref 8.9–10.3)
Chloride: 99 mmol/L (ref 98–111)
Creatinine, Ser: 0.64 mg/dL (ref 0.61–1.24)
GFR, Estimated: >60 mL/min (ref >60)
Glucose, Bld: 119 mg/dL — ABNORMAL HIGH (ref 70–99)
Potassium: 3.8 mmol/L (ref 3.5–5.1)
Sodium: 136 mmol/L (ref 135–145)

## 2024-01-28 LAB — CBC
HCT: 31.1 % — ABNORMAL LOW (ref 39.0–52.0)
Hemoglobin: 10.5 g/dL — ABNORMAL LOW (ref 13.0–17.0)
MCH: 30.4 pg (ref 26.0–34.0)
MCHC: 33.8 g/dL (ref 30.0–36.0)
MCV: 90.1 fL (ref 80.0–100.0)
Platelets: 350 10*3/uL (ref 150–400)
RBC: 3.45 MIL/uL — ABNORMAL LOW (ref 4.22–5.81)
RDW: 12.6 % (ref 11.5–15.5)
WBC: 6.8 10*3/uL (ref 4.0–10.5)
nRBC: 0 % (ref 0.0–0.2)

## 2024-01-28 LAB — HEPARIN LEVEL (UNFRACTIONATED)
Heparin Unfractionated: 0.44 [IU]/mL (ref 0.30–0.70)
Heparin Unfractionated: 0.33 [IU]/mL (ref 0.30–0.70)

## 2024-01-28 MED ORDER — PROCHLORPERAZINE EDISYLATE 10 MG/2ML IJ SOLN: 5.0000 mg | Freq: Four times a day (QID) | INTRAMUSCULAR | Status: DC | PRN

## 2024-01-28 NOTE — Progress Notes (Signed)
 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 He was also complained of shortness of breath today morning without chest pain. He is passing flatus. He had vomiting x 2 yesterday and none since.  He has not been moving much since surgery, has been taking aspirin return twice daily for DVT prophylaxis postop.  Patient already denies fever chills focal weakness, leg edema  In the ED: Vital stable saturating 93-90% on room air.  Labs reviewed mild leukocytosis  otherwise stable, Underwent CT angio chest PE and CT abdomen pelvis: Positive for PE with a single focus of segmental to subsegmental embolus in the posterior  RUL, The mid small bowel is air and fluid-filled, although not overtly distended, loops measuring up to 3.7 cm in caliber without abrupt transition point, and scattered gas and stool present throughout the colon to the rectum. Findings are most consistent with ileus or enteritis.  Soft tissue stranding and gas loculations noted in the partially imaged proximal right vastus musculature. This is of uncertain significance, however concerning for gas-forming infection in the absence of recent surgical procedure or other explanation-but later reported attended indicating recent surgical procedure with right knee arthroplasty likely the cause for the gas.Prostatomegaly with biopsy marking clips or fiducials. Patient was placed on heparin, ivf antiemetics and admission was requested.  Subjective: Patient reports he had 2 BM yesterday  But  overnight he had nausea and vomited x1 This morning he is trying to eat a liquid diet passing gas -abdomen less distended  Eager to try more solid     Assessment and Plan: Principal Problem:   Acute pulmonary embolism (HCC) Active Problems:   Cryptogenic stroke (HCC)   Primary osteoarthritis of left knee   Malignant neoplasm of prostate (HCC)   Hearing loss   Hypercholesterolemia   Osteopenia   Ileus (HCC)  Acute PE segmental to subsegmental RUL Acute DVT of right popliteal and right posterior tibial vein: VTE suspected to be 2/2 recent knee surgery on 2/17  and immobility despite being on aspirin twice daily.  Managing with heparin drip and will transition to DOAC once ileus better.  Echocardiogram stable  Ileus Constipation  Nausea vomiting abdomen pain concerning for ileus: CT abdomen no acute finding.  He has had bowel movement but again vomited last night will get x-ray abdomen this morning continue current MiraLAX twice daily Dulcolax prn PR.  Seems to be clinically improving will advance diet as tolerated imaging stable    Mild leucocytosis: Anemia hemoglobin drifting Hemoglobin stable leukocytosis resolved   Right knee arthroplasty 01/23/24 Ptot and pain control-has not been taking narcotics on hold, continue Tylenol, muscle relaxant. Cont Aquacel dressing   Cryptogenic stroke in April 2016 history of PFO/MR: followed by cardiology. Cont home asa.   Anxiety disorder: Cont bupriprion.   BPH: Ensure he is Voiding well.   OSA on CPAP: Continue.  But has not been using CPAP for few days.   SNHL/Osteopenia: Cont supportive care.vitamin d,b12, repatha.   Hyperparathyroidism hx: Ca normal.   DVT prophylaxis: Heparin Code Status:   Code Status: Full Code Family Communication: plan of care discussed with patient/wife at bedside.  Patient status is: Remains hospitalized because of severity of illness Level of care: Telemetry   Dispo: The patient is from: Home             Anticipated disposition: TBD.   Objective: Vitals last 24 hrs: Vitals:   01/27/24 1626 01/27/24 1710 01/27/24 2143 01/28/24 0543  BP: 130/75 133/68 133/69 128/68  Pulse: 76 80 79 75  Resp: 20 18 20 16   Temp: 97.6 F (36.4 C) 98.4 F (36.9 C) 98.6 F (37 C) 97.9 F (36.6 C)  TempSrc:  Oral Oral Oral  SpO2: 94% 93% 93% 93%  Weight:      Height:       Weight change:   Physical Examination: General exam: alert awake, oriented at baseline, older than stated age HEENT:Oral mucosa moist, Ear/Nose WNL grossly Respiratory system: Bilaterally clear BS,no use of accessory muscle Cardiovascular system: S1 & S2 +, No JVD. Gastrointestinal system: Abdomen soft, mildly distended, nontender , BS+ Nervous System: Alert, awake, moving all extremities,and following commands. Extremities: rt knee w/ Aquacel dressing in place  and  appears swollen than the left  Skin: No rashes,no icterus. MSK: Normal muscle bulk,tone, power   Medications reviewed:  Scheduled Meds:  aspirin EC  81 mg Oral Daily   buPROPion ER  100 mg Oral QHS   cholecalciferol  2,000 Units Oral Daily   cyanocobalamin  1,000 mcg Oral Daily   multivitamin with minerals  1 tablet Oral Daily   omega-3 acid ethyl esters  1,000 mg Oral Daily   polyethylene glycol  17 g Oral BID   Continuous Infusions:  heparin 1,750 Units/hr (01/28/24 0949)    Diet Order             Diet clear liquid Room service appropriate? Yes; Fluid consistency: Thin  Diet effective now                  Intake/Output Summary (Last 24 hours) at 01/28/2024 1026 Last data filed at 01/28/2024 0839 Gross per 24 hour  Intake 1885.78 ml  Output 926 ml  Net 959.78 ml   Net IO Since Admission: 3,104.31 mL [01/28/24 1026]  Wt Readings from Last 3 Encounters:  01/26/24 85.7 kg  01/23/24 85.7 kg  01/10/24 85.7 kg     Unresulted Labs (From admission, onward)     Start     Ordered   01/29/24 0500  Heparin level (unfractionated)  Daily,   R      Question:  Specimen collection method  Answer:  Lab=Lab collect   01/27/24 1718   01/28/24 1000  Heparin level (unfractionated)  Once-Timed,   TIMED       Question:  Specimen collection method  Answer:  Lab=Lab collect   01/28/24 0256   01/27/24 0500  Basic metabolic panel  Daily,   R      01/26/24 1335   01/27/24 0500  CBC  Daily,   R      01/26/24 1335           Data Reviewed: I have personally reviewed following labs and imaging studies CBC: Recent Labs  Lab 01/26/24 0951 01/27/24 0602 01/28/24 0127  WBC 12.4* 9.1 6.8  NEUTROABS 11.0*  --   --   HGB 12.7* 10.6* 10.5*  HCT 37.0* 32.5* 31.1*  MCV 89.6 93.4 90.1  PLT 318 308 350   Basic Metabolic Panel:  Recent Labs  Lab 01/26/24 0951 01/27/24 0602 01/28/24 0127  NA 137 134* 136  K 3.9  3.3* 3.8  CL 99 99 99  CO2 24 23 27   GLUCOSE 139* 128* 119*  BUN 17 17 16   CREATININE 0.80 0.71 0.64  CALCIUM 9.4 8.2* 8.5*   GFR: Estimated Creatinine Clearance: 84.9 mL/min (by C-G formula based on SCr of 0.64 mg/dL). Liver Function Tests:  Recent Labs  Lab 01/26/24 0951  AST 31  ALT 20  ALKPHOS 61  BILITOT 2.2*  PROT 8.1  ALBUMIN 3.8   Recent Labs  Lab 01/26/24 0951  LIPASE 28   Sepsis Labs: No results for input(s): "PROCALCITON", "LATICACIDVEN" in the last 168 hours. Recent Results (from the past 240 hours)  Resp panel by RT-PCR (RSV, Flu A&B, Covid) Anterior Nasal Swab     Status: None   Collection Time: 01/26/24  9:38 AM   Specimen: Anterior Nasal Swab  Result Value Ref Range Status   SARS Coronavirus 2 by RT PCR NEGATIVE NEGATIVE Final    Comment: (NOTE) SARS-CoV-2 target nucleic acids are NOT DETECTED.  The SARS-CoV-2 RNA is generally detectable in upper respiratory specimens during the acute phase of infection. The lowest concentration of SARS-CoV-2 viral copies this assay can detect is 138 copies/mL. A negative result does not preclude SARS-Cov-2 infection and should not be used as the sole basis  for treatment or other patient management decisions. A negative result may occur with  improper specimen collection/handling, submission of specimen other than nasopharyngeal swab, presence of viral mutation(s) within the areas targeted by this assay, and inadequate number of viral copies(<138 copies/mL). A negative result must be combined with clinical observations, patient history, and epidemiological information. The expected result is Negative.  Fact Sheet for Patients:  BloggerCourse.com  Fact Sheet for Healthcare Providers:  SeriousBroker.it  This test is no t yet approved or cleared by the [place] FDA and  has been authorized for detection and/or diagnosis of SARS-CoV-2 by FDA under an Emergency Use Authorization (EUA). This EUA will remain  in effect (meaning this test can be used) for the duration of the COVID-19 declaration under Section 564(b)(1) of the Act, 21 U.S.C.section 360bbb-3(b)(1), unless the authorization is terminated  or revoked sooner.       Influenza A by PCR NEGATIVE NEGATIVE Final   Influenza B by PCR NEGATIVE NEGATIVE Final    Comment: (NOTE) The Xpert Xpress SARS-CoV-2/FLU/RSV plus assay is intended as an aid in the diagnosis of influenza from Nasopharyngeal swab specimens and should not be used as a sole basis for treatment. Nasal washings and aspirates are unacceptable for Xpert Xpress SARS-CoV-2/FLU/RSV testing.  Fact Sheet for Patients: BloggerCourse.com  Fact Sheet for Healthcare Providers: SeriousBroker.it  This test is not yet approved or cleared by the [place] FDA and has been authorized for detection and/or diagnosis of SARS-CoV-2 by FDA under an Emergency Use Authorization (EUA). This EUA will remain in effect (meaning this test can be used) for the duration of the COVID-19 declaration under Section 564(b)(1) of the Act, 21  U.S.C. section 360bbb-3(b)(1), unless the authorization is terminated or revoked.     Resp Syncytial Virus by PCR NEGATIVE NEGATIVE Final    Comment: (NOTE) Fact Sheet for Patients: BloggerCourse.com  Fact Sheet for Healthcare Providers: SeriousBroker.it  This test is not yet approved or cleared by the [place] FDA and has been authorized for detection and/or diagnosis of SARS-CoV-2 by FDA under an Emergency Use Authorization (EUA). This EUA will remain in effect (meaning this test can be used) for the duration of the COVID-19 declaration under  Section 564(b)(1) of the Act, 21 U.S.C. section 360bbb-3(b)(1), unless the authorization is terminated or revoked.  Performed at [facility], [address], [place], [place] [address]     Antimicrobials/Microbiology: Anti-infectives (From admission, onward)    None      No results found for: "SDES", "SPECREQUEST", "CULT", "REPTSTATUS"   Radiology Studies: DG Abd 1 View Result Date: 01/28/2024 CLINICAL DATA:  Ileus. EXAM: ABDOMEN - 1 VIEW COMPARISON:  January 27, 2024. FINDINGS: Status post cholecystectomy. Dilated small bowel loops are again noted without colonic dilatation, concerning for distal small bowel obstruction or possibly ileus. IMPRESSION: Small bowel dilatation is again noted concerning for distal small bowel obstruction or possibly ileus. Electronically Signed   By: Lupita Raider M.D.   On: 01/28/2024 10:22   ECHOCARDIOGRAM COMPLETE Result Date: 01/27/2024    ECHOCARDIOGRAM REPORT   Patient Name:   James Tate Date of Exam: 01/27/2024 Medical Rec #:  [id number]       Height:       72.0 in Accession #:    [id number]      Weight:       189.0 lb Date of Birth:  1945/12/23       BSA:          2.080 m Patient Age:    78 years        BP:           134/71 mmHg Patient Gender: M               HR:           78 bpm. Exam Location:  Inpatient Procedure: 2D  Echo, Cardiac Doppler and Color Doppler (Both Spectral and Color            Flow Doppler were utilized during procedure). Indications:    Pulmonary Embolus I26.09  History:        Patient has prior history of Echocardiogram examinations, most                 recent 08/02/2019. Stroke.  Sonographer:    Webb Laws Referring Phys: [id number] Lucciano Vitali IMPRESSIONS  1. Left ventricular ejection fraction, by estimation, is 60 to 65%. The left ventricle has normal function. The left ventricle has no regional wall motion abnormalities. There is mild concentric left ventricular hypertrophy. Left ventricular diastolic parameters are consistent with Grade II diastolic dysfunction (pseudonormalization).  2. Right ventricular systolic function is normal. The right ventricular size is normal. Tricuspid regurgitation signal is inadequate for assessing PA pressure.  3. The mitral valve is abnormal. Mild to moderate eccentric mitral valve regurgitation. No evidence of mitral stenosis.  4. The aortic valve is tricuspid. There is mild calcification of the aortic valve. Aortic valve regurgitation is not visualized. No aortic stenosis is present.  5. IVC not visualized. FINDINGS  Left Ventricle: Left ventricular ejection fraction, by estimation, is 60 to 65%. The left ventricle has normal function. The left ventricle has no regional wall motion abnormalities. Strain imaging was not performed. The left ventricular internal cavity  size was normal in size. There is mild concentric left ventricular hypertrophy. Left ventricular diastolic parameters are consistent with Grade II diastolic dysfunction (pseudonormalization). Right Ventricle: The right ventricular size is normal. No increase in right ventricular wall thickness. Right ventricular systolic function is normal. Tricuspid regurgitation signal is inadequate for assessing PA pressure. Left Atrium: Left atrial size was normal in size. Right Atrium: Right atrial size was normal in  size. Pericardium: There is no evidence of pericardial effusion. Mitral Valve: The mitral valve is abnormal. Mild to moderate mitral valve regurgitation. No evidence of mitral valve stenosis. Tricuspid Valve: The tricuspid valve is normal in structure. Tricuspid valve regurgitation is not demonstrated. Aortic Valve: The aortic valve is tricuspid. There is mild calcification of the aortic valve. Aortic valve regurgitation is not visualized. No aortic stenosis is present. Pulmonic Valve: The pulmonic valve was normal in structure. Pulmonic valve regurgitation is not visualized. Aorta: The aortic root is normal in size and structure. Venous: IVC not visualized. The inferior vena cava was not well visualized. IAS/Shunts: No atrial level shunt detected by color flow Doppler. Additional Comments: 3D imaging was not performed.  LEFT VENTRICLE PLAX 2D LVIDd:         4.90 cm   Diastology LVIDs:         2.80 cm   LV e' medial:    6.09 cm/s LV PW:         1.20 cm   LV E/e' medial:  12.8 LV IVS:        1.20 cm   LV e' lateral:   10.80 cm/s LVOT diam:     2.00 cm   LV E/e' lateral: 7.2 LV SV:         68 LV SV Index:   32 LVOT Area:     3.14 cm  RIGHT VENTRICLE RV Basal diam:  3.70 cm RV S prime:     17.20 cm/s TAPSE (M-mode): 2.5 cm LEFT ATRIUM             Index        RIGHT ATRIUM          Index LA diam:        3.80 cm 1.83 cm/m   RA Area:     9.20 cm LA Vol (A2C):   41.6 ml 20.00 ml/m  RA Volume:   15.50 ml 7.45 ml/m LA Vol (A4C):   32.1 ml 15.43 ml/m LA Biplane Vol: 37.2 ml 17.89 ml/m  AORTIC VALVE LVOT Vmax:   108.00 cm/s LVOT Vmean:  77.200 cm/s LVOT VTI:    0.215 m  AORTA Ao Root diam: 3.40 cm Ao Asc diam:  3.30 cm MITRAL VALVE MV Area (PHT): 3.85 cm    SHUNTS MV Decel Time: 197 msec    Systemic VTI:  0.22 m MV E velocity: 78.00 cm/s  Systemic Diam: 2.00 cm MV A velocity: 75.80 cm/s MV E/A ratio:  1.03 Dalton McleanMD Electronically signed by Wilfred Lacy Signature Date/Time: 01/27/2024/3:19:22 PM    Final     DG Abd 1 View Result Date: 01/27/2024 CLINICAL DATA:  Follow-up ileus. EXAM: ABDOMEN - 1 VIEW COMPARISON:  CT, 01/26/2024 FINDINGS: Multiple loops of dilated small bowel are consistent with a partial obstruction versus small bowel adynamic ileus, without change from the previous day's CT scan. IMPRESSION: No change from the previous day's CT scan. Multiple dilated small bowel loops consistent with a partial obstruction versus adynamic ileus. Electronically Signed   By: Amie Portland M.D.   On: 01/27/2024 09:02   VAS [place] LOWER EXTREMITY VENOUS (DVT) Result Date: 01/26/2024  Lower Venous DVT Study Patient Name:  DRESDEN LOZITO  Date of Exam:   01/26/2024 Medical Rec #: [id number]        Accession #:    [id number] Date of Birth: 09-22-1946        Patient Gender: M Patient Age:   77 years  Exam Location:  [facility] Procedure:      VAS [place] LOWER EXTREMITY VENOUS (DVT) Referring Phys: Quavon Keisling --------------------------------------------------------------------------------  Indications: Pulmonary embolism.  Risk Factors: Surgery RLE TKA on 01/23/2024. Comparison Study: No previous exams Performing Technologist: Jody Hill RVT, RDMS  Examination Guidelines: A complete evaluation includes B-mode imaging, spectral Doppler, color Doppler, and power Doppler as needed of all accessible portions of each vessel. Bilateral testing is considered an integral part of a complete examination. Limited examinations for reoccurring indications may be performed as noted. The reflux portion of the exam is performed with the patient in reverse Trendelenburg.  +---------+---------------+---------+-----------+----------+--------------+ RIGHT    CompressibilityPhasicitySpontaneityPropertiesThrombus Aging +---------+---------------+---------+-----------+----------+--------------+ CFV      Full           Yes      Yes                                  +---------+---------------+---------+-----------+----------+--------------+ SFJ      Full                                                        +---------+---------------+---------+-----------+----------+--------------+ FV Prox  Full           Yes      Yes                                 +---------+---------------+---------+-----------+----------+--------------+ FV Mid   Full           Yes      Yes                                 +---------+---------------+---------+-----------+----------+--------------+ FV DistalFull           Yes      Yes                                 +---------+---------------+---------+-----------+----------+--------------+ PFV      Full                                                        +---------+---------------+---------+-----------+----------+--------------+ POP      None           No       No                   Acute          +---------+---------------+---------+-----------+----------+--------------+ PTV      None           No       No                   Acute          +---------+---------------+---------+-----------+----------+--------------+ PERO     Full                                                        +---------+---------------+---------+-----------+----------+--------------+   +---------+---------------+---------+-----------+-------------+--------------+  LEFT     CompressibilityPhasicitySpontaneityProperties   Thrombus Aging +---------+---------------+---------+-----------+-------------+--------------+ CFV      Full           Yes      Yes                                    +---------+---------------+---------+-----------+-------------+--------------+ SFJ      Full                                                           +---------+---------------+---------+-----------+-------------+--------------+ FV Prox  Full           Yes      Yes                                     +---------+---------------+---------+-----------+-------------+--------------+ FV Mid   Full           Yes      Yes                                    +---------+---------------+---------+-----------+-------------+--------------+ FV DistalFull           Yes      Yes                                    +---------+---------------+---------+-----------+-------------+--------------+ PFV      Full                                                           +---------+---------------+---------+-----------+-------------+--------------+ POP      Full           Yes      Yes        rouleaux flow               +---------+---------------+---------+-----------+-------------+--------------+ PTV      Full                                                           +---------+---------------+---------+-----------+-------------+--------------+ PERO     Full                                                           +---------+---------------+---------+-----------+-------------+--------------+     Summary: BILATERAL: -No evidence of popliteal cyst, bilaterally. RIGHT: - Findings consistent with acute deep vein thrombosis involving the right popliteal vein, and right posterior tibial veins.   LEFT: - There is no evidence of deep vein thrombosis in the lower extremity.  *  See table(s) above for measurements and observations. Electronically signed by Heath Lark on 01/26/2024 at 5:03:07 PM.    Final    CT Angio Chest PE W and/or Wo Contrast Addendum Date: 01/26/2024 ADDENDUM REPORT: 01/26/2024 11:59 ADDENDUM: Findings discussed by telephone with Dr. Maple Hudson 11:55 a.m., 01/26/2024. By report, patient underwent right knee arthroplasty 2 days ago. Electronically Signed   By: Jearld Lesch M.D.   On: 01/26/2024 11:59   Result Date: 01/26/2024 CLINICAL DATA:  PE suspected, nausea, vomiting, constipation, shortness of breath, history of prostate cancer * Tracking Code: BO * EXAM: CT ANGIOGRAPHY CHEST CT  ABDOMEN AND PELVIS WITH CONTRAST TECHNIQUE: Multidetector CT imaging of the chest was performed using the standard protocol during bolus administration of intravenous contrast. Multiplanar CT image reconstructions and MIPs were obtained to evaluate the vascular anatomy. Multidetector CT imaging of the abdomen and pelvis was performed using the standard protocol during bolus administration of intravenous contrast. RADIATION DOSE REDUCTION: This exam was performed according to the departmental dose-optimization program which includes automated exposure control, adjustment of the mA and/or kV according to patient size and/or use of iterative reconstruction technique. CONTRAST:  OMNIPAQUE IOHEXOL 350 MG/ML SOLN COMPARISON:  PET-CT, 03/18/2022 FINDINGS: CT CHEST ANGIOGRAM FINDINGS Cardiovascular: Satisfactory opacification of the pulmonary arteries to the segmental level. Positive examination for pulmonary embolism with a single focus of segmental to subsegmental embolus in the posterior right upper lobe (series 4, image 57). Cardiomegaly. Three-vessel coronary artery calcifications. No pericardial effusion. Mediastinum/Nodes: No enlarged mediastinal, hilar, or axillary lymph nodes. Thyroid gland, trachea, and esophagus demonstrate no significant findings. Lungs/Pleura: Bibasilar scarring and volume loss, with elevation of the right hemidiaphragm. No pleural effusion or pneumothorax. Musculoskeletal: No chest wall abnormality. No acute osseous findings. Review of the MIP images confirms the above findings. CT ABDOMEN PELVIS FINDINGS Hepatobiliary: No focal liver abnormality is seen. Status post cholecystectomy. No biliary dilatation. Pancreas: Unremarkable. No pancreatic ductal dilatation or surrounding inflammatory changes. Spleen: Normal in size without significant abnormality. Adrenals/Urinary Tract: Adrenal glands are unremarkable. Kidneys are normal, without renal calculi, solid lesion, or hydronephrosis.  Bladder is unremarkable. Stomach/Bowel: Stomach is within normal limits. Appendix appears normal. The mid small bowel is air and fluid-filled, although not overtly distended, loops measuring up to 3.7 cm in caliber without abrupt transition point, and scattered gas and stool present throughout the colon to the rectum. Vascular/Lymphatic: Aortic atherosclerosis. No enlarged abdominal or pelvic lymph nodes. Reproductive: Prostatomegaly with biopsy marking clips or fiducials. Other: No abdominal wall hernia or abnormality. No ascites. Musculoskeletal: No acute osseous findings. Soft tissue stranding and gas loculations noted in the partially imaged proximal right vastus musculature (series 2, image 103). IMPRESSION: 1. Positive examination for pulmonary embolism with a single focus of segmental to subsegmental embolus in the posterior right upper lobe. No evidence of right heart strain. 2. The mid small bowel is air and fluid-filled, although not overtly distended, loops measuring up to 3.7 cm in caliber without abrupt transition point, and scattered gas and stool present throughout the colon to the rectum. Findings are most consistent with ileus or enteritis. 3. Soft tissue stranding and gas loculations noted in the partially imaged proximal right vastus musculature. This is of uncertain significance, however concerning for gas-forming infection in the absence of recent surgical procedure or other explanation. Consider dedicated imaging of the right lower extremity. 4. Prostatomegaly with biopsy marking clips or fiducials. No evidence of lymphadenopathy or metastatic disease in the abdomen or pelvis. 5. Cardiomegaly and coronary  artery disease. Call report request was placed at the time of interpretation. Report issued at this time in the interest of expediency. Final communication of critical findings will be documented. Aortic Atherosclerosis (ICD10-I70.0). Electronically Signed: By: Jearld Lesch M.D. On:  01/26/2024 11:49   CT ABDOMEN PELVIS W CONTRAST Addendum Date: 01/26/2024 ADDENDUM REPORT: 01/26/2024 11:59 ADDENDUM: Findings discussed by telephone with Dr. Maple Hudson 11:55 a.m., 01/26/2024. By report, patient underwent right knee arthroplasty 2 days ago. Electronically Signed   By: Jearld Lesch M.D.   On: 01/26/2024 11:59   Result Date: 01/26/2024 CLINICAL DATA:  PE suspected, nausea, vomiting, constipation, shortness of breath, history of prostate cancer * Tracking Code: BO * EXAM: CT ANGIOGRAPHY CHEST CT ABDOMEN AND PELVIS WITH CONTRAST TECHNIQUE: Multidetector CT imaging of the chest was performed using the standard protocol during bolus administration of intravenous contrast. Multiplanar CT image reconstructions and MIPs were obtained to evaluate the vascular anatomy. Multidetector CT imaging of the abdomen and pelvis was performed using the standard protocol during bolus administration of intravenous contrast. RADIATION DOSE REDUCTION: This exam was performed according to the departmental dose-optimization program which includes automated exposure control, adjustment of the mA and/or kV according to patient size and/or use of iterative reconstruction technique. CONTRAST:  OMNIPAQUE IOHEXOL 350 MG/ML SOLN COMPARISON:  PET-CT, 03/18/2022 FINDINGS: CT CHEST ANGIOGRAM FINDINGS Cardiovascular: Satisfactory opacification of the pulmonary arteries to the segmental level. Positive examination for pulmonary embolism with a single focus of segmental to subsegmental embolus in the posterior right upper lobe (series 4, image 57). Cardiomegaly. Three-vessel coronary artery calcifications. No pericardial effusion. Mediastinum/Nodes: No enlarged mediastinal, hilar, or axillary lymph nodes. Thyroid gland, trachea, and esophagus demonstrate no significant findings. Lungs/Pleura: Bibasilar scarring and volume loss, with elevation of the right hemidiaphragm. No pleural effusion or pneumothorax. Musculoskeletal: No  chest wall abnormality. No acute osseous findings. Review of the MIP images confirms the above findings. CT ABDOMEN PELVIS FINDINGS Hepatobiliary: No focal liver abnormality is seen. Status post cholecystectomy. No biliary dilatation. Pancreas: Unremarkable. No pancreatic ductal dilatation or surrounding inflammatory changes. Spleen: Normal in size without significant abnormality. Adrenals/Urinary Tract: Adrenal glands are unremarkable. Kidneys are normal, without renal calculi, solid lesion, or hydronephrosis. Bladder is unremarkable. Stomach/Bowel: Stomach is within normal limits. Appendix appears normal. The mid small bowel is air and fluid-filled, although not overtly distended, loops measuring up to 3.7 cm in caliber without abrupt transition point, and scattered gas and stool present throughout the colon to the rectum. Vascular/Lymphatic: Aortic atherosclerosis. No enlarged abdominal or pelvic lymph nodes. Reproductive: Prostatomegaly with biopsy marking clips or fiducials. Other: No abdominal wall hernia or abnormality. No ascites. Musculoskeletal: No acute osseous findings. Soft tissue stranding and gas loculations noted in the partially imaged proximal right vastus musculature (series 2, image 103). IMPRESSION: 1. Positive examination for pulmonary embolism with a single focus of segmental to subsegmental embolus in the posterior right upper lobe. No evidence of right heart strain. 2. The mid small bowel is air and fluid-filled, although not overtly distended, loops measuring up to 3.7 cm in caliber without abrupt transition point, and scattered gas and stool present throughout the colon to the rectum. Findings are most consistent with ileus or enteritis. 3. Soft tissue stranding and gas loculations noted in the partially imaged proximal right vastus musculature. This is of uncertain significance, however concerning for gas-forming infection in the absence of recent surgical procedure or other explanation.  Consider dedicated imaging of the right lower extremity. 4. Prostatomegaly with biopsy marking  clips or fiducials. No evidence of lymphadenopathy or metastatic disease in the abdomen or pelvis. 5. Cardiomegaly and coronary artery disease. Call report request was placed at the time of interpretation. Report issued at this time in the interest of expediency. Final communication of critical findings will be documented. Aortic Atherosclerosis (ICD10-I70.0). Electronically Signed: By: Jearld Lesch M.D. On: 01/26/2024 11:49     LOS: 2 days   Total time spent in review of labs and imaging, patient evaluation, formulation of plan, documentation and communication with family: 35 minutes  Lanae Boast, MD  [facility]  01/28/2024, 10:26 AM

## 2024-01-28 NOTE — Progress Notes (Signed)
 PHARMACY - ANTICOAGULATION CONSULT NOTE  Pharmacy Consult for Heparin Indication: pulmonary embolus, DVT  Allergies  Allergen Reactions   Atorvastatin Other (See Comments)    Aches in legs   Codeine Other (See Comments)    Headache.    Erythromycin     Childhood allergy - unknown   Rosuvastatin Calcium     Memory problem (03/2020) Aches in   legs    Patient Measurements: Height: 6' (182.9 cm) Weight: 85.7 kg (189 lb) IBW/kg (Calculated) : 77.6 Heparin Dosing Weight: Use total body weight 85 kg  Vital Signs: Temp: 97.9 F (36.6 C) (02/22 0543) Temp Source: Oral (02/22 0543) BP: 128/68 (02/22 0543) Pulse Rate: 75 (02/22 0543)  Labs: Recent Labs    01/26/24 0951 01/26/24 2321 01/27/24 0602 01/27/24 0830 01/27/24 1614 01/28/24 0127  HGB 12.7*  --  10.6*  --   --  10.5*  HCT 37.0*  --  32.5*  --   --  31.1*  PLT 318  --  308  --   --  350  HEPARINUNFRC  --    < >  --  0.40 0.29* 0.44  CREATININE 0.80  --  0.71  --   --  0.64   < > = values in this interval not displayed.    Estimated Creatinine Clearance: 84.9 mL/min (by C-G formula based on SCr of 0.64 mg/dL).   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   Flat feet    Glaucoma    left eye   History of colon polyps    History of hyperparathyroidism    Melanoma of back (HCC)    MR (mitral regurgitation) 05/16/2018   Moderate noted on ECHO   MVP (mitral valve prolapse) 05/16/2018   Noted on ECHO   OSA on CPAP    cpap set on 4   Osteopenia    resolved with medical therapy (calcium and fosamax)   PFO (patent foramen ovale)    Sensorineural hearing loss (SNHL) of both ears    Does Not wear HAs    Medications: No anticoagulants PTA -Pt was prescribed ASA for DVT ppx post-op  Assessment:  Pt is a 66 yoM presented to ED on 2/20 for N/V, constipation, and SOB. PMH significant for PFO,  cryptogenic stroke in 2016, recent TKA on 01/23/24. Pt was prescribed ASA 81 mg PO BID post-ortho surgery for DVT ppx.   Venous doppler reveals findings consistent with acute DVT involving right popliteal vein and right posterior tibial veins. CTA revealed PE, no evidence of RHS. Pharmacy consulted to dose heparin.    Today, 01/27/2024:  Confirmatory heparin level = 0.33 remains therapeutic on heparin infusion of 1750 units/hr CBC: Hgb low but stable, Plt WNL ASA 81 mg PO daily to continue given history of cryptogenic CVA Confirmed with RN - no signs of bleeding Copay check for DOAC done, see note from patient advocate team on 2/21  Goal of Therapy:  Heparin level 0.3-0.7 units/ml  Monitor platelets by anticoagulation protocol: Yes   Plan: Continue heparin infusion at current rate of 1750 units/hr CBC, heparin level daily Monitor for signs of bleeding  Cindi Carbon, PharmD 01/28/24 10:07 AM

## 2024-01-28 NOTE — Progress Notes (Signed)
 PHARMACY - ANTICOAGULATION CONSULT NOTE  Pharmacy Consult for Heparin Indication: pulmonary embolus  Allergies  Allergen Reactions   Atorvastatin Other (See Comments)    Aches in legs   Codeine Other (See Comments)    Headache.    Erythromycin     Childhood allergy - unknown   Rosuvastatin Calcium     Memory problem (03/2020) Aches in   legs    Patient Measurements: Height: 6' (182.9 cm) Weight: 85.7 kg (189 lb) IBW/kg (Calculated) : 77.6 Heparin Dosing Weight: 85 kg   Vital Signs: Temp: 98.6 F (37 C) (02/21 2143) Temp Source: Oral (02/21 2143) BP: 133/69 (02/21 2143) Pulse Rate: 79 (02/21 2143)  Labs: Recent Labs    01/26/24 0951 01/26/24 2321 01/27/24 0602 01/27/24 0830 01/27/24 1614 01/28/24 0127  HGB 12.7*  --  10.6*  --   --  10.5*  HCT 37.0*  --  32.5*  --   --  31.1*  PLT 318  --  308  --   --  350  HEPARINUNFRC  --    < >  --  0.40 0.29* 0.44  CREATININE 0.80  --  0.71  --   --  0.64   < > = values in this interval not displayed.    Estimated Creatinine Clearance: 84.9 mL/min (by C-G formula based on SCr of 0.64 mg/dL).   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   Flat feet    Glaucoma    left eye   History of colon polyps    History of hyperparathyroidism    Melanoma of back (HCC)    MR (mitral regurgitation) 05/16/2018   Moderate noted on ECHO   MVP (mitral valve prolapse) 05/16/2018   Noted on ECHO   OSA on CPAP    cpap set on 4   Osteopenia    resolved with medical therapy (calcium and fosamax)   PFO (patent foramen ovale)    Sensorineural hearing loss (SNHL) of both ears    Does Not wear HAs    Medications:  Scheduled:   aspirin EC  81 mg Oral Daily   buPROPion ER  100 mg Oral QHS   cholecalciferol  2,000 Units Oral Daily   cyanocobalamin  1,000 mcg Oral Daily   multivitamin with minerals  1 tablet Oral  Daily   omega-3 acid ethyl esters  1,000 mg Oral Daily   polyethylene glycol  17 g Oral BID   Infusions:   heparin 1,750 Units/hr (01/27/24 1802)     Assessment: 69 yoM presented to ED on 2/20 for N/V, constipation, and SOB.  CTa positive for PE.  Noted recent total right knee arthroplasty procedure on 2/17 - discharged on Aspirin 81mg  PO BID. Pharmacy is consulted for IV heparin dosing.  Baseline INR, aPTT: WNL Prior anticoagulation: none  Today, 01/27/2024:  -Heparin level 0.44, therapeutic on 1750 units/hr - Hgb 10.5, plts WNL -No interruptions or line issues noted -Noted to be on daily aspirin for previous crytogenic stroke & hx of PFO/MR  Goal of Therapy:  Heparin level 0.3-0.7 units/ml  Monitor platelets by anticoagulation protocol: Yes   Plan:  -continue heparin infusion to 1750 units/hr  -Confirmatory heparin level in 8 hours -Daily CBC -Monitor for signs of bleeding or thrombosis   Arley Phenix RPh 01/28/2024, 2:56 AM

## 2024-01-28 NOTE — TOC Initial Note (Signed)
 Transition of Care [facility]) - Initial/Assessment Note    Patient Details  Name: James Tate MRN: [id number] Date of Birth: [birth year]  [facility] CM/SW Contact:    Diona Browner, LCSW Phone Number: 01/28/2024, 9:57 AM  Clinical Narrative:                 Pt from home w/ spouse, continuing medical workup. Pending PT/OT rec. TOC will follow for d/c needs.         Patient Goals and CMS Choice Patient states their goals for this hospitalization and ongoing recovery are:: return home          Expected Discharge Plan and Services       Living arrangements for the past 2 months: Single Family Home                                      Prior Living Arrangements/Services Living arrangements for the past 2 months: Single Family Home Lives with:: Spouse Patient language and need for interpreter reviewed:: Yes Do you feel safe going back to the place where you live?: Yes      Need for Family Participation in Patient Care: Yes (Comment) Care giver support system in place?: Yes (comment)   Criminal Activity/Legal Involvement Pertinent to Current Situation/Hospitalization: No - Comment as needed  Activities of Daily Living   ADL Screening (condition at time of admission) Independently performs ADLs?: No Does the patient have a NEW difficulty with bathing/dressing/toileting/self-feeding that is expected to last >3 days?: Yes (Initiates electronic notice to provider for possible OT consult) Does the patient have a NEW difficulty with getting in/out of bed, walking, or climbing stairs that is expected to last >3 days?: Yes (Initiates electronic notice to provider for possible PT consult) Does the patient have a NEW difficulty with communication that is expected to last >3 days?: No Is the patient deaf or have difficulty hearing?: No Does the patient have difficulty seeing, even when wearing glasses/contacts?: No Does the patient have difficulty concentrating,  remembering, or making decisions?: Yes  Permission Sought/Granted                  Emotional Assessment Appearance:: Appears stated age     Orientation: : Oriented to Self, Oriented to Place, Oriented to  Time, Oriented to Situation Alcohol / Substance Use: Not Applicable Psych Involvement: No (comment)  Admission diagnosis:  Ileus (HCC) [K56.7] Acute pulmonary embolism (HCC) [I26.99] Other acute pulmonary embolism without acute cor pulmonale (HCC) [I26.99] Patient Active Problem List   Diagnosis Date Noted   Ileus (HCC) 01/26/2024   Acute pulmonary embolism (HCC) 01/26/2024   Degenerative arthritis of right knee 01/23/2024   Amnesia 03/22/2022   Atrial septal defect within oval fossa 03/22/2022   Erectile dysfunction 03/22/2022   Hearing loss 03/22/2022   History of malignant melanoma 03/22/2022   Hypercholesterolemia 03/22/2022   Increased bilirubin level 03/22/2022   Obstructive sleep apnea (adult) (pediatric) 03/22/2022   Osteopenia 03/22/2022   History of colonic polyps 03/22/2022   Personal history of transient ischemic attack (TIA), 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 residual deficits 03/22/2022   Malignant neoplasm of prostate (HCC) 03/21/2022   Primary osteoarthritis of left knee 12/22/2018   Sensorineural hearing loss (SNHL) of both ears 09/29/2018   Cryptogenic stroke (HCC) 07/02/2015   Melanoma of back (HCC) 12/10/2014   Wound of back 12/10/2014   PCP:  Debroah Loop, DO Pharmacy:   [id number] [place], [place] - [address] AT [facility] [address] [facility] [address]  [place] [address] Phone: [phone] Fax: [phone]  [facility] - [place], [place] - [address] [facility] AT Portal to Registered [facility] Sites [address] [place] [address] Phone: [phone] Fax: [phone]     Social Drivers of Health (SDOH) Social History: SDOH Screenings   Food Insecurity: No Food  Insecurity (01/26/2024)  Housing: Low Risk  (01/26/2024)  Transportation Needs: No Transportation Needs (01/26/2024)  Utilities: Not At Risk (01/26/2024)  Social Connections: Socially Isolated (01/26/2024)  Tobacco Use: Medium Risk (01/26/2024)   SDOH Interventions:     Readmission Risk Interventions    01/28/2024    9:55 AM  Readmission Risk Prevention Plan  Post Dischage Appt Complete  Medication Screening Complete  Transportation Screening Complete

## 2024-01-28 NOTE — Evaluation (Signed)
 Physical Therapy Evaluation Patient Details Name: James Tate MRN: [id number] DOB: [birth year] Today's Date: 01/28/2024  History of Present Illness  78 yo male who presented with generalized abdominal pain, nausea and vomiting and constipation and admitted 01/26/24 for Acute PE segmental to subsegmental RUL  and Acute DVT of right popliteal and right posterior tibial vein.  PMHx: right knee arthroplasty 01/23/24, cryptogenic stroke in April 2016, history of PFO, MR followed by cardiology as outpatient, arthritis, BPH OSA on CPAP, SNHL, Osteopenia, hyperparathyroidism, TIA, L TKA (2020)  Clinical Impression  Pt admitted with above diagnosis.  Pt currently with functional limitations due to the deficits listed below (see PT Problem List). Pt will benefit from acute skilled PT to increase their independence and safety with mobility to allow discharge.  Pt with recent R TKA and returned home.  Pt feeling poorly at home and not mobilizing prior to return to ED.  Pt found to have PE/DVT.  Pt currently on 2L O2 [place] and maintained today for ambulation (see more details in mobility section below).  Pt assisted with ambulating short distance and then repositioned in recliner end of session.  Pt provided with ice packs for R knee (since right knee mildly more swollen then left and still a little painful).  Pt encouraged to perform HEP later today if tolerated.  Pt anticipates return home with family assist upon d/c and f/u with HHPT.          If plan is discharge home, recommend the following: A little help with walking and/or transfers;A little help with bathing/dressing/bathroom;Assistance with cooking/housework;Assist for transportation;Help with stairs or ramp for entrance   Can travel by private vehicle        Equipment Recommendations None recommended by PT  Recommendations for Other Services       Functional Status Assessment Patient has had a recent decline in their functional status and  demonstrates the ability to make significant improvements in function in a reasonable and predictable amount of time.     Precautions / Restrictions Precautions Precautions: Knee;Fall Precaution/Restrictions Comments: monitor SpO2 Restrictions Weight Bearing Restrictions Per Provider Order: No      Mobility  Bed Mobility Overal bed mobility: Needs Assistance Bed Mobility: Supine to Sit     Supine to sit: Supervision, HOB elevated, Used rails     General bed mobility comments: pt self assisted R LE with UEs (pt on 2L O2 [place] at rest, SPO2 92%)    Transfers Overall transfer level: Needs assistance Equipment used: Rolling walker (2 wheels) Transfers: Sit to/from Stand Sit to Stand: Contact guard assist           General transfer comment: verbal cues for hand placement    Ambulation/Gait Ambulation/Gait assistance: Contact guard assist Gait Distance (Feet): 40 Feet Assistive device: Rolling walker (2 wheels) Gait Pattern/deviations: Step-to pattern, Trunk flexed, Antalgic Gait velocity: decreased     General Gait Details: verbal cues for sequence, RW positioning, posture, step length; SpO2 93-95% on 2L O2 [place], pt reports 2/4 dyspnea; distance to tolerance  Stairs            Wheelchair Mobility     Tilt Bed    Modified Rankin (Stroke Patients Only)       Balance  Pertinent Vitals/Pain Pain Assessment Pain Assessment: 0-10 Pain Score: 3  Pain Location: right knee Pain Descriptors / Indicators: Sore, Discomfort Pain Intervention(s): Repositioned, Monitored during session, Ice applied    Home Living Family/patient expects to be discharged to:: Private residence Living Arrangements: Spouse/significant other Available Help at Discharge: Family (2 daughters will be able to assist in addition to spouse) Type of Home: House Home Access: Stairs to enter Entrance Stairs-Rails: Right;Left;Can  reach both Entrance Stairs-Number of Steps: 4   Home Layout: One level Home Equipment: Educational psychologist (2 wheels);Cane - single point      Prior Function Prior Level of Function : Independent/Modified Independent             Mobility Comments: IND with occational use of SPC for community navigaiton, IND for all ADLs, self care tasks and IADLs.  Prior to recent R TKA       Extremity/Trunk Assessment        Lower Extremity Assessment Lower Extremity Assessment: Generalized weakness;RLE deficits/detail RLE Deficits / Details: able to perform ankle pumps, AAROM right knee approx 8-90*       Communication   Communication Communication: No apparent difficulties    Cognition Arousal: Alert Behavior During Therapy: WFL for tasks assessed/performed   PT - Cognitive impairments: No apparent impairments                         Following commands: Intact       Cueing       General Comments      Exercises     Assessment/Plan    PT Assessment Patient needs continued PT services  PT Problem List Decreased strength;Decreased range of motion;Decreased activity tolerance;Decreased balance;Decreased mobility;Pain;Cardiopulmonary status limiting activity;Decreased knowledge of use of DME       PT Treatment Interventions DME instruction;Gait training;Stair training;Functional mobility training;Therapeutic exercise;Therapeutic activities;Balance training;Neuromuscular re-education;Patient/family education    PT Goals (Current goals can be found in the Care Plan section)  Acute Rehab PT Goals PT Goal Formulation: With patient Time For Goal Achievement: 02/11/24 Potential to Achieve Goals: Good    Frequency Min 1X/week     Co-evaluation               AM-PAC PT "6 Clicks" Mobility  Outcome Measure Help needed turning from your back to your side while in a flat bed without using bedrails?: None Help needed moving from lying on your back to  sitting on the side of a flat bed without using bedrails?: A Little Help needed moving to and from a bed to a chair (including a wheelchair)?: A Little Help needed standing up from a chair using your arms (e.g., wheelchair or bedside chair)?: A Little Help needed to walk in hospital room?: A Little Help needed climbing 3-5 steps with a railing? : A Lot 6 Click Score: 18    End of Session Equipment Utilized During Treatment: Gait belt Activity Tolerance: Patient tolerated treatment well Patient left: in chair;with call bell/phone within reach;with family/visitor present;with chair alarm set   PT Visit Diagnosis: Difficulty in walking, not elsewhere classified (R26.2)    Time: 1205-1226 PT Time Calculation (min) (ACUTE ONLY): 21 min   Charges:   PT Evaluation $PT Eval Low Complexity: 1 Low   PT General Charges $$ ACUTE PT VISIT: 1 Visit        Thomasene Mohair PT, DPT Physical Therapist Acute Rehabilitation Services Office: [phone]   [name] 01/28/2024, 2:09 PM

## 2024-01-29 DIAGNOSIS — I2699 Other pulmonary embolism without acute cor pulmonale: Secondary | ICD-10-CM | POA: Diagnosis not present

## 2024-01-29 LAB — CBC
HCT: 29.4 % — ABNORMAL LOW (ref 39.0–52.0)
Hemoglobin: 9.4 g/dL — ABNORMAL LOW (ref 13.0–17.0)
MCH: 30 pg (ref 26.0–34.0)
MCHC: 32 g/dL (ref 30.0–36.0)
MCV: 93.9 fL (ref 80.0–100.0)
Platelets: 329 10*3/uL (ref 150–400)
RBC: 3.13 MIL/uL — ABNORMAL LOW (ref 4.22–5.81)
RDW: 12.8 % (ref 11.5–15.5)
WBC: 6.2 10*3/uL (ref 4.0–10.5)
nRBC: 0 % (ref 0.0–0.2)

## 2024-01-29 LAB — BASIC METABOLIC PANEL
Anion gap: 9 (ref 5–15)
BUN: 17 mg/dL (ref 8–23)
CO2: 26 mmol/L (ref 22–32)
Calcium: 8.3 mg/dL — ABNORMAL LOW (ref 8.9–10.3)
Chloride: 99 mmol/L (ref 98–111)
Creatinine, Ser: 0.63 mg/dL (ref 0.61–1.24)
GFR, Estimated: >60 mL/min (ref >60)
Glucose, Bld: 118 mg/dL — ABNORMAL HIGH (ref 70–99)
Potassium: 3.4 mmol/L — ABNORMAL LOW (ref 3.5–5.1)
Sodium: 134 mmol/L — ABNORMAL LOW (ref 135–145)

## 2024-01-29 LAB — HEPARIN LEVEL (UNFRACTIONATED): Heparin Unfractionated: 0.52 [IU]/mL (ref 0.30–0.70)

## 2024-01-29 MED ORDER — POTASSIUM CHLORIDE CRYS ER 20 MEQ PO TBCR
40.0000 meq | EXTENDED_RELEASE_TABLET | Freq: Once | ORAL | Status: AC
  Administered 2024-01-29: 40 meq via ORAL
  Filled 2024-01-29: qty 2

## 2024-01-29 MED ORDER — POLYETHYLENE GLYCOL 3350 17 G PO PACK
17.0000 g | PACK | Freq: Every day | ORAL | Status: DC
  Administered 2024-01-30: 17 g via ORAL
  Filled 2024-01-29 (×3): qty 1

## 2024-01-29 NOTE — Progress Notes (Signed)
 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 He was also complained of shortness of breath today morning without chest pain. He is passing flatus. He had vomiting x 2 yesterday and none since.  He has not been moving much since surgery, has been taking aspirin return twice daily for DVT prophylaxis postop.  Patient already denies fever chills focal weakness, leg edema  In the ED: Vital stable saturating 93-90% on room air.  Labs reviewed mild leukocytosis  otherwise stable, Underwent CT angio chest PE and CT abdomen pelvis: Positive for PE with a single focus of segmental to subsegmental embolus in the posterior  RUL, The mid small bowel is air and fluid-filled, although not overtly distended, loops measuring up to 3.7 cm in caliber without abrupt transition point, and scattered gas and stool present throughout the colon to the rectum. Findings are most consistent with ileus or enteritis.  Soft tissue stranding and gas loculations noted in the partially imaged proximal right vastus musculature. This is of uncertain significance, however concerning for gas-forming infection in the absence of recent surgical procedure or other explanation-but later reported attended indicating recent surgical procedure with right knee arthroplasty likely the cause for the gas.Prostatomegaly with biopsy marking clips or fiducials. Patient was placed on heparin, ivf antiemetics and admission was requested.  Subjective: Patient seen and examined this morning Reports  he feeling much better overall Overnight afebrile, BP stable labs as mild hypokalemia CBC with chronic anemia  Patient has had a bowel movement no more nausea or vomiting Diet advanced to full liquid diet 2/23 AM Passing flatus   Assessment and Plan: Principal Problem:   Acute pulmonary embolism (HCC) Active Problems:   Cryptogenic stroke (HCC)   Primary osteoarthritis of left knee   Malignant neoplasm of prostate (HCC)   Hearing loss   Hypercholesterolemia   Osteopenia   Ileus (HCC)  Acute PE segmental to subsegmental RUL Acute DVT of right popliteal and right posterior tibial vein: VTE suspected to be 2/2 recent knee surgery on 2/17  and immobility despite being on aspirin twice daily.  Managing with heparin drip and will transition to DOAC once ileus further improves and tolerating po well.Echocardiogram stable  Ileus Constipation  Nausea vomiting abdomen pain concerning for ileus: CT abdomen no acute finding.  Episode of vomiting on 2/21 night since then doing well I had discussed with general surgeon Dr. Donell Beers who reviewed the CT scan okay to advance diet as tolerated .continue MiraLAX twice daily Dulcolax prn PR. Overall clinically improving, diet advanced to full liquid diet, advance slowly   Mild leucocytosis: Anemia hemoglobin drifting Hemoglobin stable leukocytosis resolved . Monitor Recent Labs  Lab 01/26/24 0951 01/27/24 0602 01/28/24 0127 01/29/24 0646  HGB 12.7* 10.6* 10.5* 9.4*  HCT 37.0* 32.5* 31.1* 29.4*    Hypokalemia: Replaced  Right knee arthroplasty 01/23/24 Ptot and pain control-has not been taking narcotics on hold, continue Tylenol, muscle relaxant. Cont Aquacel dressing   Cryptogenic stroke in April 2016 history of PFO/MR: followed by cardiology. Cont home asa.   Anxiety disorder: Cont bupriprion.   BPH: Ensure he is Voiding well.   OSA on  CPAP: Continue.  But has not been using CPAP for few days.   SNHL/Osteopenia: Cont supportive  care.vitamin d,b12, repatha.   Hyperparathyroidism hx: Ca normal.   DVT prophylaxis: Heparin Code Status:   Code Status: Full Code Family Communication: plan of care discussed with patient/wife at bedside. Patient status is: Remains hospitalized because of severity of illness Level of care: Telemetry   Dispo: The patient is from: Home            Anticipated disposition: TBD.   Objective: Vitals last 24 hrs: Vitals:   01/28/24 0543 01/28/24 1408 01/28/24 2027 01/29/24 0550  BP: 128/68 (!) 141/69 130/75 137/71  Pulse: 75 81 89 78  Resp: 16 16 18 17   Temp: 97.9 F (36.6 C) 98.8 F (37.1 C) 97.8 F (36.6 C) (!) 97.5 F (36.4 C)  TempSrc: Oral Oral Oral Oral  SpO2: 93% 93% 96% 95%  Weight:      Height:       Weight change:   Physical Examination: General exam: alert awake, oriented at baseline, older than stated age HEENT:Oral mucosa moist, Ear/Nose WNL grossly Respiratory system: Bilaterally clear BS,no use of accessory muscle Cardiovascular system: S1 & S2 +, No JVD. Gastrointestinal system: Abdomen soft, full/distended ,NT,BS+ Nervous System: Alert, awake, moving all extremities,and following commands. Extremities: LE edema neg,distal peripheral pulses palpable and warm.  Skin: No rashes,no icterus. MSK: Normal muscle bulk,tone, power   Medications reviewed:  Scheduled Meds:  aspirin EC  81 mg Oral Daily   buPROPion ER  100 mg Oral QHS   cholecalciferol  2,000 Units Oral Daily   cyanocobalamin  1,000 mcg Oral Daily   multivitamin with minerals  1 tablet Oral Daily   omega-3 acid ethyl esters  1,000 mg Oral Daily   [START ON 01/30/2024] polyethylene glycol  17 g Oral Daily   Continuous Infusions:  heparin 1,750 Units/hr (01/28/24 2346)    Diet Order             Diet full liquid Room service appropriate? Yes; Fluid consistency: Thin  Diet effective now                  Intake/Output Summary (Last 24 hours) at 01/29/2024 1254 Last data filed at 01/29/2024  0207 Gross per 24 hour  Intake 500.86 ml  Output 200 ml  Net 300.86 ml   Net IO Since Admission: 3,607.14 mL [01/29/24 1254]  Wt Readings from Last 3 Encounters:  01/26/24 85.7 kg  01/23/24 85.7 kg  01/10/24 85.7 kg     Unresulted Labs (From admission, onward)     Start     Ordered   01/29/24 0500  Heparin level (unfractionated)  Daily,   R     Question:  Specimen collection method  Answer:  Lab=Lab collect   01/27/24 1718   01/27/24 0500  Basic metabolic panel  Daily,   R      01/26/24 1335   01/27/24 0500  CBC  Daily,   R      01/26/24 1335           Data Reviewed: I have personally reviewed following labs and imaging studies CBC: Recent Labs  Lab 01/26/24 0951 01/27/24 0602 01/28/24 0127 01/29/24 0646  WBC 12.4* 9.1 6.8 6.2  NEUTROABS 11.0*  --   --   --   HGB 12.7* 10.6* 10.5* 9.4*  HCT 37.0* 32.5* 31.1* 29.4*  MCV 89.6 93.4 90.1 93.9  PLT 318 308 350 329   Basic  Metabolic Panel:  Recent Labs  Lab 01/26/24 0951 01/27/24 0602 01/28/24 0127 01/29/24 0646  NA 137 134* 136 134*  K 3.9 3.3* 3.8 3.4*  CL 99 99 99 99  CO2 24 23 27 26   GLUCOSE 139* 128* 119* 118*  BUN 17 17 16 17   CREATININE 0.80 0.71 0.64 0.63  CALCIUM 9.4 8.2* 8.5* 8.3*   GFR: Estimated Creatinine Clearance: 84.9 mL/min (by C-G formula based on SCr of 0.63 mg/dL). Liver Function Tests:  Recent Labs  Lab 01/26/24 0951  AST 31  ALT 20  ALKPHOS 61  BILITOT 2.2*  PROT 8.1  ALBUMIN 3.8   Recent Labs  Lab 01/26/24 0951  LIPASE 28   Sepsis Labs: No results for input(s): "PROCALCITON", "LATICACIDVEN" in the last 168 hours. Recent Results (from the past 240 hours)  Resp panel by RT-PCR (RSV, Flu A&B, Covid) Anterior Nasal Swab     Status: None   Collection Time: 01/26/24  9:38 AM   Specimen: Anterior Nasal Swab  Result Value Ref Range Status   SARS Coronavirus 2 by RT PCR NEGATIVE NEGATIVE Final    Comment: (NOTE) SARS-CoV-2 target nucleic acids are NOT DETECTED.  The  SARS-CoV-2 RNA is generally detectable in upper respiratory specimens during the acute phase of infection. The lowest concentration of SARS-CoV-2 viral copies this assay can detect is 138 copies/mL. A negative result does not preclude SARS-Cov-2 infection and should not be used as the sole basis for treatment or other patient management decisions. A negative result may occur with  improper specimen collection/handling, submission of specimen other than nasopharyngeal swab, presence of viral mutation(s) within the areas targeted by this assay, and inadequate number of viral copies(<138 copies/mL). A negative result must be combined with clinical observations, patient history, and epidemiological information. The expected result is Negative.  Fact Sheet for Patients:  BloggerCourse.com  Fact Sheet for Healthcare Providers:  SeriousBroker.it  This test is no t yet approved or cleared by the [place] FDA and  has been authorized for detection and/or diagnosis of SARS-CoV-2 by FDA under an Emergency Use Authorization (EUA). This EUA will remain  in effect (meaning this test can be used) for the duration of the COVID-19 declaration under Section 564(b)(1) of the Act, 21 U.S.C.section 360bbb-3(b)(1), unless the authorization is terminated  or revoked sooner.       Influenza A by PCR NEGATIVE NEGATIVE Final   Influenza B by PCR NEGATIVE NEGATIVE Final    Comment: (NOTE) The Xpert Xpress SARS-CoV-2/FLU/RSV plus assay is intended as an aid in the diagnosis of influenza from Nasopharyngeal swab specimens and should not be used as a sole basis for treatment. Nasal washings and aspirates are unacceptable for Xpert Xpress SARS-CoV-2/FLU/RSV testing.  Fact Sheet for Patients: BloggerCourse.com  Fact Sheet for Healthcare Providers: SeriousBroker.it  This test is not yet approved or  cleared by the [place] FDA and has been authorized for detection and/or diagnosis of SARS-CoV-2 by FDA under an Emergency Use Authorization (EUA). This EUA will remain in effect (meaning this test can be used) for the duration of the COVID-19 declaration under Section 564(b)(1) of the Act, 21 U.S.C. section 360bbb-3(b)(1), unless the authorization is terminated or revoked.     Resp Syncytial Virus by PCR NEGATIVE NEGATIVE Final    Comment: (NOTE) Fact Sheet for Patients: BloggerCourse.com  Fact Sheet for Healthcare Providers: SeriousBroker.it  This test is not yet approved or cleared by the [place] FDA and has been authorized for detection and/or  diagnosis of SARS-CoV-2 by FDA under an Emergency Use Authorization (EUA). This EUA will remain in effect (meaning this test can be used) for the duration of the COVID-19 declaration under Section 564(b)(1) of the Act, 21 U.S.C. section 360bbb-3(b)(1), unless the authorization is terminated or revoked.  Performed at [facility], [address], [place], [place] [address]     Antimicrobials/Microbiology: Anti-infectives (From admission, onward)    None      No results found for: "SDES", "SPECREQUEST", "CULT", "REPTSTATUS"   Radiology Studies: DG Abd 1 View Result Date: 01/28/2024 CLINICAL DATA:  Ileus. EXAM: ABDOMEN - 1 VIEW COMPARISON:  January 27, 2024. FINDINGS: Status post cholecystectomy. Dilated small bowel loops are again noted without colonic dilatation, concerning for distal small bowel obstruction or possibly ileus. IMPRESSION: Small bowel dilatation is again noted concerning for distal small bowel obstruction or possibly ileus. Electronically Signed   By: Lupita Raider M.D.   On: 01/28/2024 10:22   ECHOCARDIOGRAM COMPLETE Result Date: 01/27/2024    ECHOCARDIOGRAM REPORT   Patient Name:   SHERMON BOZZI Date of Exam: 01/27/2024 Medical Rec  #:  [id number]       Height:       72.0 in Accession #:    [id number]      Weight:       189.0 lb Date of Birth:  Apr 01, 1946       BSA:          2.080 m Patient Age:    78 years        BP:           134/71 mmHg Patient Gender: M               HR:           78 bpm. Exam Location:  Inpatient Procedure: 2D Echo, Cardiac Doppler and Color Doppler (Both Spectral and Color            Flow Doppler were utilized during procedure). Indications:    Pulmonary Embolus I26.09  History:        Patient has prior history of Echocardiogram examinations, most                 recent 08/02/2019. Stroke.  Sonographer:    Webb Laws Referring Phys: [id number] Seva Chancy IMPRESSIONS  1. Left ventricular ejection fraction, by estimation, is 60 to 65%. The left ventricle has normal function. The left ventricle has no regional wall motion abnormalities. There is mild concentric left ventricular hypertrophy. Left ventricular diastolic parameters are consistent with Grade II diastolic dysfunction (pseudonormalization).  2. Right ventricular systolic function is normal. The right ventricular size is normal. Tricuspid regurgitation signal is inadequate for assessing PA pressure.  3. The mitral valve is abnormal. Mild to moderate eccentric mitral valve regurgitation. No evidence of mitral stenosis.  4. The aortic valve is tricuspid. There is mild calcification of the aortic valve. Aortic valve regurgitation is not visualized. No aortic stenosis is present.  5. IVC not visualized. FINDINGS  Left Ventricle: Left ventricular ejection fraction, by estimation, is 60 to 65%. The left ventricle has normal function. The left ventricle has no regional wall motion abnormalities. Strain imaging was not performed. The left ventricular internal cavity  size was normal in size. There is mild concentric left ventricular hypertrophy. Left ventricular diastolic parameters are consistent with Grade II diastolic dysfunction (pseudonormalization). Right Ventricle:  The right ventricular size is normal. No increase in right ventricular wall thickness.  Right ventricular systolic function is normal. Tricuspid regurgitation signal is inadequate for assessing PA pressure. Left Atrium: Left atrial size was normal in size. Right Atrium: Right atrial size was normal in size. Pericardium: There is no evidence of pericardial effusion. Mitral Valve: The mitral valve is abnormal. Mild to moderate mitral valve regurgitation. No evidence of mitral valve stenosis. Tricuspid Valve: The tricuspid valve is normal in structure. Tricuspid valve regurgitation is not demonstrated. Aortic Valve: The aortic valve is tricuspid. There is mild calcification of the aortic valve. Aortic valve regurgitation is not visualized. No aortic stenosis is present. Pulmonic Valve: The pulmonic valve was normal in structure. Pulmonic valve regurgitation is not visualized. Aorta: The aortic root is normal in size and structure. Venous: IVC not visualized. The inferior vena cava was not well visualized. IAS/Shunts: No atrial level shunt detected by color flow Doppler. Additional Comments: 3D imaging was not performed.  LEFT VENTRICLE PLAX 2D LVIDd:         4.90 cm   Diastology LVIDs:         2.80 cm   LV e' medial:    6.09 cm/s LV PW:         1.20 cm   LV E/e' medial:  12.8 LV IVS:        1.20 cm   LV e' lateral:   10.80 cm/s LVOT diam:     2.00 cm   LV E/e' lateral: 7.2 LV SV:         68 LV SV Index:   32 LVOT Area:     3.14 cm  RIGHT VENTRICLE RV Basal diam:  3.70 cm RV S prime:     17.20 cm/s TAPSE (M-mode): 2.5 cm LEFT ATRIUM             Index        RIGHT ATRIUM          Index LA diam:        3.80 cm 1.83 cm/m   RA Area:     9.20 cm LA Vol (A2C):   41.6 ml 20.00 ml/m  RA Volume:   15.50 ml 7.45 ml/m LA Vol (A4C):   32.1 ml 15.43 ml/m LA Biplane Vol: 37.2 ml 17.89 ml/m  AORTIC VALVE LVOT Vmax:   108.00 cm/s LVOT Vmean:  77.200 cm/s LVOT VTI:    0.215 m  AORTA Ao Root diam: 3.40 cm Ao Asc diam:  3.30 cm  MITRAL VALVE MV Area (PHT): 3.85 cm    SHUNTS MV Decel Time: 197 msec    Systemic VTI:  0.22 m MV E velocity: 78.00 cm/s  Systemic Diam: 2.00 cm MV A velocity: 75.80 cm/s MV E/A ratio:  1.03 Dalton McleanMD Electronically signed by Wilfred Lacy Signature Date/Time: 01/27/2024/3:19:22 PM    Final      LOS: 3 days   Total time spent in review of labs and imaging, patient evaluation, formulation of plan, documentation and communication with family: 35 minutes  Lanae Boast, MD  [facility]  01/29/2024, 12:54 PM

## 2024-01-29 NOTE — Progress Notes (Signed)
 Physical Therapy Treatment Patient Details Name: James Tate MRN: [id number] DOB: [birth year] Today's Date: 01/29/2024   History of Present Illness 78 yo male who presented with generalized abdominal pain, nausea and vomiting and constipation and admitted 01/26/24 for Acute PE segmental to subsegmental RUL  and Acute DVT of right popliteal and right posterior tibial vein.  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  Pt ambulated in hallway and performed on room air today.  SPO2 91-92% at rest and 95-96% during ambulation while on room.   Pt left on room air and RN aware.     If plan is discharge home, recommend the following: A little help with walking and/or transfers;A little help with bathing/dressing/bathroom;Assistance with cooking/housework;Assist for transportation;Help with stairs or ramp for entrance   Can travel by private vehicle        Equipment Recommendations  None recommended by PT    Recommendations for Other Services       Precautions / Restrictions Precautions Precautions: Knee;Fall Precaution/Restrictions Comments: monitor SpO2 Restrictions Weight Bearing Restrictions Per Provider Order: No     Mobility  Bed Mobility Overal bed mobility: Needs Assistance Bed Mobility: Supine to Sit     Supine to sit: Supervision, HOB elevated, Used rails     General bed mobility comments: pt self assisted R LE with UEs, therapist provided slicker surface (pt felt stuck with socks and sheets)    Transfers Overall transfer level: Needs assistance Equipment used: Rolling walker (2 wheels) Transfers: Sit to/from Stand Sit to Stand: Contact guard assist           General transfer comment: verbal cues for hand placement     Ambulation/Gait Ambulation/Gait assistance: Contact guard assist Gait Distance (Feet): 60 Feet Assistive device: Rolling walker (2 wheels) Gait Pattern/deviations: Step-to pattern, Trunk flexed, Antalgic Gait velocity: decreased     General Gait Details: verbal cues for sequence, RW positioning, posture, step length; SpO2 95-96% on room air; distance to tolerance   Stairs             Wheelchair Mobility     Tilt Bed    Modified Rankin (Stroke Patients Only)       Balance                                            Communication Communication Communication: No apparent difficulties  Cognition Arousal: Alert Behavior During Therapy: WFL for tasks assessed/performed   PT - Cognitive impairments: No apparent impairments                         Following commands: Intact      Cueing    Exercises Total Joint Exercises Knee Flexion: AROM, Right, Seated, 10 reps    General Comments        Pertinent Vitals/Pain Pain Assessment Pain Assessment: 0-10 Pain Score: 3  Pain Location: right knee Pain Descriptors / Indicators: Sore, Discomfort Pain Intervention(s): Monitored during session, Repositioned, Ice applied    Home Living                          Prior  Function            PT Goals (current goals can now be found in the care plan section) Progress towards PT goals: Progressing toward goals    Frequency    Min 1X/week      PT Plan      Co-evaluation              AM-PAC PT "6 Clicks" Mobility   Outcome Measure  Help needed turning from your back to your side while in a flat bed without using bedrails?: None Help needed moving from lying on your back to sitting on the side of a flat bed without using bedrails?: A Little Help needed moving to and from a bed to a chair (including a wheelchair)?: A Little Help needed standing up from a chair using your arms (e.g., wheelchair or bedside chair)?: A  Little Help needed to walk in hospital room?: A Little Help needed climbing 3-5 steps with a railing? : A Lot 6 Click Score: 18    End of Session Equipment Utilized During Treatment: Gait belt Activity Tolerance: Patient tolerated treatment well Patient left: in chair;with call bell/phone within reach;with family/visitor present   PT Visit Diagnosis: Difficulty in walking, not elsewhere classified (R26.2)     Time: 1610-9604 PT Time Calculation (min) (ACUTE ONLY): 30 min  Charges:    $Gait Training: 23-37 mins PT General Charges $$ ACUTE PT VISIT: 1 Visit                     [name], DPT Physical Therapist Acute Rehabilitation Services Office: [phone]    [name] 01/29/2024, 1:17 PM

## 2024-01-29 NOTE — Plan of Care (Signed)
  Problem: Education: Goal: Knowledge of General Education information will improve Description: Including pain rating scale, medication(s)/side effects and non-pharmacologic comfort measures Outcome: Progressing   Problem: Health Behavior/Discharge Planning: Goal: Ability to manage health-related needs will improve Outcome: Progressing   Problem: Clinical Measurements: Goal: Ability to maintain clinical measurements within normal limits will improve Outcome: Progressing Goal: Will remain free from infection Outcome: Progressing Goal: Diagnostic test results will improve Outcome: Progressing Goal: Cardiovascular complication will be avoided Outcome: Progressing   Problem: Activity: Goal: Risk for activity intolerance will decrease Outcome: Progressing   Problem: Nutrition: Goal: Adequate nutrition will be maintained Outcome: Progressing   Problem: Pain Managment: Goal: General experience of comfort will improve and/or be controlled Outcome: Progressing   Problem: Safety: Goal: Ability to remain free from injury will improve Outcome: Progressing   Problem: Skin Integrity: Goal: Risk for impaired skin integrity will decrease Outcome: Progressing

## 2024-01-29 NOTE — Progress Notes (Signed)
 PHARMACY - ANTICOAGULATION CONSULT NOTE  Pharmacy Consult for Heparin Indication: pulmonary embolus, DVT  Allergies  Allergen Reactions   Atorvastatin Other (See Comments)    Aches in legs   Codeine Other (See Comments)    Headache.    Erythromycin     Childhood allergy - unknown   Rosuvastatin Calcium     Memory problem (03/2020) Aches in   legs    Patient Measurements: Height: 6' (182.9 cm) Weight: 85.7 kg (189 lb) IBW/kg (Calculated) : 77.6 Heparin Dosing Weight: Use total body weight 85 kg  Vital Signs: Temp: 97.5 F (36.4 C) (02/23 0550) Temp Source: Oral (02/23 0550) BP: 137/71 (02/23 0550) Pulse Rate: 78 (02/23 0550)  Labs: Recent Labs    01/27/24 0602 01/27/24 0830 01/28/24 0127 01/28/24 1010 01/29/24 0646  HGB 10.6*  --  10.5*  --  9.4*  HCT 32.5*  --  31.1*  --  29.4*  PLT 308  --  350  --  329  HEPARINUNFRC  --    < > 0.44 0.33 0.52  CREATININE 0.71  --  0.64  --  0.63   < > = values in this interval not displayed.    Estimated Creatinine Clearance: 84.9 mL/min (by C-G formula based on SCr of 0.63 mg/dL).   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   Flat feet    Glaucoma    left eye   History of colon polyps    History of hyperparathyroidism    Melanoma of back (HCC)    MR (mitral regurgitation) 05/16/2018   Moderate noted on ECHO   MVP (mitral valve prolapse) 05/16/2018   Noted on ECHO   OSA on CPAP    cpap set on 4   Osteopenia    resolved with medical therapy (calcium and fosamax)   PFO (patent foramen ovale)    Sensorineural hearing loss (SNHL) of both ears    Does Not wear HAs    Medications: No anticoagulants PTA -Pt was prescribed ASA for DVT ppx post-op  Assessment:  Pt is a 82 yoM presented to ED on 2/20 for N/V, constipation, and SOB. PMH significant for PFO, cryptogenic stroke in 2016, recent TKA on  01/23/24. Pt was prescribed ASA 81 mg PO BID post-ortho surgery for DVT ppx.   Venous doppler reveals findings consistent with acute DVT involving right popliteal vein and right posterior tibial veins. CTA revealed PE, no evidence of RHS. Pharmacy consulted to dose heparin.    Today, 01/27/2024:  Confirmatory heparin level = 0.52 remains therapeutic on heparin infusion of 1750 units/hr CBC: Hgb low and trending down, Plt WNL ASA 81 mg PO daily to continue given history of cryptogenic CVA No bleeding reported Copay check for DOAC done, see note from patient advocate team on 2/21  Goal of Therapy:  Heparin level 0.3-0.7 units/ml  Monitor platelets by anticoagulation protocol: Yes   Plan: Continue heparin infusion at current rate of 1750 units/hr CBC, heparin level daily Monitor for signs of bleeding  Cindi Carbon, PharmD 01/29/24 8:41 AM

## 2024-01-29 NOTE — Progress Notes (Signed)
 OT Cancellation Note  Patient Details Name: James Tate MRN: [id number] DOB: 1946-06-21   Cancelled Treatment:    Reason Eval/Treat Not Completed: Fatigue/lethargy limiting ability to participate (Pt sleeping - snoring) Spoke with family at length. They are interested in AE for LB access (demo and education) due to recent TKA. FYI Pt hard of hearing. Family asks for therapy to come after 10am tomorrow so that they can be present for education.   Evern Bio Ludwika Rodd 01/29/2024, 5:04 PM  Nyoka Cowden OTR/L Acute Rehabilitation Services Office: [phone]

## 2024-01-30 DIAGNOSIS — I2699 Other pulmonary embolism without acute cor pulmonale: Secondary | ICD-10-CM | POA: Diagnosis not present

## 2024-01-30 DIAGNOSIS — K9189 Other postprocedural complications and disorders of digestive system: Secondary | ICD-10-CM | POA: Diagnosis not present

## 2024-01-30 DIAGNOSIS — R933 Abnormal findings on diagnostic imaging of other parts of digestive tract: Secondary | ICD-10-CM | POA: Diagnosis not present

## 2024-01-30 DIAGNOSIS — K567 Ileus, unspecified: Secondary | ICD-10-CM | POA: Diagnosis not present

## 2024-01-30 LAB — CBC
HCT: 30.9 % — ABNORMAL LOW (ref 39.0–52.0)
Hemoglobin: 10 g/dL — ABNORMAL LOW (ref 13.0–17.0)
MCH: 29.9 pg (ref 26.0–34.0)
MCHC: 32.4 g/dL (ref 30.0–36.0)
MCV: 92.2 fL (ref 80.0–100.0)
Platelets: 392 10*3/uL (ref 150–400)
RBC: 3.35 MIL/uL — ABNORMAL LOW (ref 4.22–5.81)
RDW: 13 % (ref 11.5–15.5)
WBC: 6.9 10*3/uL (ref 4.0–10.5)
nRBC: 0 % (ref 0.0–0.2)

## 2024-01-30 LAB — BASIC METABOLIC PANEL
Anion gap: 10 (ref 5–15)
BUN: 16 mg/dL (ref 8–23)
CO2: 29 mmol/L (ref 22–32)
Calcium: 8.8 mg/dL — ABNORMAL LOW (ref 8.9–10.3)
Chloride: 96 mmol/L — ABNORMAL LOW (ref 98–111)
Creatinine, Ser: 0.76 mg/dL (ref 0.61–1.24)
GFR, Estimated: >60 mL/min (ref >60)
Glucose, Bld: 114 mg/dL — ABNORMAL HIGH (ref 70–99)
Potassium: 3.7 mmol/L (ref 3.5–5.1)
Sodium: 135 mmol/L (ref 135–145)

## 2024-01-30 LAB — HEPARIN LEVEL (UNFRACTIONATED): Heparin Unfractionated: 0.37 [IU]/mL (ref 0.30–0.70)

## 2024-01-30 MED ORDER — LACTATED RINGERS IV SOLN
INTRAVENOUS | Status: AC
  Administered 2024-01-30: 1000 mL via INTRAVENOUS

## 2024-01-30 MED ORDER — BISACODYL 10 MG RE SUPP
10.0000 mg | Freq: Every day | RECTAL | Status: DC
  Filled 2024-01-30: qty 1

## 2024-01-30 NOTE — Evaluation (Signed)
 Occupational Therapy Evaluation Patient Details Name: James Tate MRN: [id number] DOB: 1946-08-08 Today's Date: 01/30/2024   History of Present Illness   78 yo male who presented with generalized abdominal pain, nausea and vomiting and constipation and admitted 01/26/24 for Acute PE segmental to subsegmental RUL  and Acute DVT of right popliteal and right posterior tibial vein.  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   Current level of function is below patient's typical baseline.    During this evaluation, patient was limited by post-op mild RT knee pain of 3/10 and abdominal discomfort also 3/10, generalized weakness, and impaired activity tolerance, all of which has the potential to impact patient's and/or caregivers' safety and independence during functional mobility, as well as performance for ADLs.    Patient lives with his spouse who  able to provide 24/7 supervision and assistance.  Patient demonstrates good rehab potential, and should benefit from continued skilled occupational therapy services while in acute care to maximize safety, independence and quality of life at home.  Continued occupational therapy services in the home are recommended.  Pt and spouse are considering need for this.   ?      If plan is discharge home, recommend the following:   A little help with walking and/or transfers;A little help with bathing/dressing/bathroom;Assistance with cooking/housework;Assist for transportation     Functional Status Assessment   Patient has had a  recent decline in their functional status and demonstrates the ability to make significant improvements in function in a reasonable and predictable amount of time.     Equipment Recommendations    (Compression stocking aid)     Recommendations for Other Services         Precautions/Restrictions   Precautions Precautions: Knee;Fall Precaution/Restrictions Comments: monitor SpO2 Restrictions Weight Bearing Restrictions Per Provider Order: No     Mobility Bed Mobility Overal bed mobility: Needs Assistance Bed Mobility: Supine to Sit     Supine to sit: Min assist     General bed mobility comments: Pt exited bed on LT side due to room but usually exits RT side. Pt required Min As just to advance his RLE to the LT.    Transfers                          Balance Overall balance assessment: Needs assistance Sitting-balance support: Feet supported Sitting balance-Leahy Scale: Good     Standing balance support: During functional activity, Reliant on assistive device for balance, No upper extremity supported Standing balance-Leahy Scale: Fair (able to stand at sink and release hands for hand hygiene with CGA-SBA)                             ADL either performed or assessed with clinical judgement   ADL Overall ADL's : Needs assistance/impaired Eating/Feeding: Independent   Grooming: Wash/dry hands;Standing;Contact guard assist   Upper Body Bathing: Sitting;Set up     Lower Body Bathing Details (indicate cue type and reason): Pt and spouse educated  on long handled bath sponge/brush for ease reaching lower legs and feet. Upper Body Dressing : Set up;Sitting   Lower Body Dressing: Minimal assistance;Cueing for sequencing;Moderate assistance;Cueing for compensatory techniques;Sitting/lateral leans;Sit to/from stand Lower Body Dressing Details (indicate cue type and reason): Pt ans spouse decided that most of the adaptive equipment will not be  needed at home. Pt's wife has a Sports administrator he can use to help don underwear/etc over feet. Pt cautioned to always sit when donning/doffing clothing over feet for fall prevention.  Educated on sequencing for ease. Spouse did report trouble with pt's compression hose. Pt given handout and education on compression hose donner with list of vendors. Toilet Transfer: Minimal assistance;Contact guard assist;BSC/3in1;Ambulation;Rolling walker (2 wheels) Toilet Transfer Details (indicate cue type and reason): Pt stood from EOB to RW with Min As. Pt ambulated to bathroom with RW and CGA. Pt descended to [facility] atop toilet with CGA and stood from [facility] with CGA. Pt ambulated to recliner after standing for hand hygine and cued for hands, otherwise CGA for descent. Toileting- Clothing Manipulation and Hygiene: Contact guard assist;Minimal assistance Toileting - Clothing Manipulation Details (indicate cue type and reason): Pt performed hygiene and only assisted with clothing management.     Functional mobility during ADLs: Contact guard assist;Minimal assistance;Rolling walker (2 wheels);Cueing for safety;Cueing for sequencing       Vision   Vision Assessment?: No apparent visual deficits     Perception         Praxis         Pertinent Vitals/Pain Pain Assessment Pain Score: 3  Pain Location: right knee and abdomen feels bloated Pain Descriptors / Indicators: Sore, Discomfort, Tightness Pain Intervention(s): Limited activity within patient's tolerance, Monitored during session, Ice applied, RN gave pain meds during session     Extremity/Trunk Assessment Upper Extremity Assessment Upper Extremity Assessment: Overall WFL for tasks assessed   Lower Extremity Assessment Lower Extremity Assessment: Defer to PT evaluation       Communication Communication Communication: No apparent difficulties   Cognition Arousal: Alert Behavior During Therapy: WFL for tasks assessed/performed                OT - Cognition Comments: Spouse reports pt was altered yesterday and earlier this morning but recognized that pt seems back to cognitive baseline at time of OT visit.                 Following commands: Intact       Cueing  General Comments          Exercises     Shoulder Instructions      Home Living                               Home Equipment: Toilet riser;Rolling Walker (2 wheels);Cane - single point;Adaptive equipment Adaptive Equipment: Reacher        Prior Functioning/Environment                      OT Problem List: Decreased activity tolerance;Decreased knowledge of use of DME or AE;Cardiopulmonary status limiting activity;Pain   OT Treatment/Interventions: Self-care/ADL training;Therapeutic activities;Patient/family education;DME and/or AE instruction;Balance training      OT Goals(Current goals can be found in the care plan section)   Acute Rehab OT Goals Patient Stated Goal: Regain baseline level of function OT Goal Formulation: With patient/family Time For Goal Achievement: 02/13/24 Potential to Achieve Goals: Good ADL  Goals Pt Will Perform Grooming: with modified independence;standing Pt Will Perform Lower Body Dressing: with modified independence;with adaptive equipment;sitting/lateral leans;sit to/from stand Pt Will Transfer to Toilet: with modified independence;ambulating Additional ADL Goal #1: Pt and/or spouse/family will verbalize understanding re: adaptive equipment options to decrease caregiver buredn with LE ADLs.   OT Frequency:  Min 1X/week    Co-evaluation              AM-PAC OT "6 Clicks" Daily Activity     Outcome Measure Help from another person eating meals?: None Help from another person taking care of personal grooming?: A Little Help from another person toileting, which includes using toliet, bedpan, or urinal?: A Little Help from another person bathing (including washing, rinsing, drying)?:  A Little Help from another person to put on and taking off regular upper body clothing?: A Little Help from another person to put on and taking off regular lower body clothing?: A Little 6 Click Score: 19   End of Session Equipment Utilized During Treatment: Gait belt;Rolling walker (2 wheels) Nurse Communication: Other (comment) (Rn in room and handed off to OT)  Activity Tolerance: Patient tolerated treatment well Patient left: in chair;with call bell/phone within reach;with family/visitor present;with nursing/sitter in room  OT Visit Diagnosis: Pain;Unsteadiness on feet (R26.81) (decreased ADLs) Pain - Right/Left: Right Pain - part of body: Knee                Time: 1031-1110 OT Time Calculation (min): 39 min Charges:  OT General Charges $OT Visit: 1 Visit OT Evaluation $OT Eval Low Complexity: 1 Low OT Treatments $Self Care/Home Management : 8-22 mins $Therapeutic Activity: 8-22 mins  Victorino Dike, OT Acute Rehab Services Office: [phone] 01/30/2024   Theodoro Clock 01/30/2024, 11:36 AM

## 2024-01-30 NOTE — Progress Notes (Signed)
 Mobility Specialist - Progress Note   01/30/24 1545  Mobility  Activity Ambulated with assistance in hallway  Level of Assistance Contact guard assist, steadying assist  Assistive Device Front wheel walker  Distance Ambulated (ft) 60 ft  Range of Motion/Exercises Active  Activity Response Tolerated well  Mobility Referral Yes  Mobility visit 1 Mobility  Mobility Specialist Start Time (ACUTE ONLY) 1530  Mobility Specialist Stop Time (ACUTE ONLY) 1545  Mobility Specialist Time Calculation (min) (ACUTE ONLY) 15 min   Pt was found on recliner chair and agreeable to ambulate. Grew fatigued with session. At EOS returned to recliner chair with all needs met. Call bell in reach and daughter in room.  Billey Chang Mobility Specialist

## 2024-01-30 NOTE — Consult Note (Signed)
 [facility] Consultation Note  Referring Provider: [facility] Primary Care Physician:  Debroah Loop, DO Primary Gastroenterologist:  Dr. Bing Matter PCP  Reason for Consultation:  ileus  HPI: James Tate is a 78 y.o. male admitted with nausea, failure to thrive, abdominal distention.  Found to have ileus on imaging without bowel obstruction.  Symptoms started day after outpatient total knee replacement.  Having some loose stools and passage of flatus.  No vomiting.  No narcotics at present. No blood in stool.  Abdomen feels a bit tight but no abdominal pain.  Last colonoscopy 2014 with Dr. Evette Cristal without polyps/lesions seen.   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   Flat feet    Glaucoma    left eye   History of colon polyps    History of hyperparathyroidism    Melanoma of back (HCC)    MR (mitral regurgitation) 05/16/2018   Moderate noted on ECHO   MVP (mitral valve prolapse) 05/16/2018   Noted on ECHO   OSA on CPAP    cpap set on 4   Osteopenia    resolved with medical therapy (calcium and fosamax)   PFO (patent foramen ovale)    Sensorineural hearing loss (SNHL) of both ears    Does Not wear HAs    Past Surgical History:  Procedure Laterality Date   APPLICATION OF A-CELL OF BACK N/A 12/19/2014   Procedure: APPLICATION OF A-CELL OF BACK;  Surgeon: Wayland Denis, DO;  Location: [facility];  Service: Plastics;  Laterality: N/A;   CHOLECYSTECTOMY  1996   COLONOSCOPY     EP IMPLANTABLE DEVICE N/A 07/02/2015   Procedure: Loop Recorder Insertion;  Surgeon: Hillis Range, MD;  Location: [facility];  Service: Cardiovascular;  Laterality: N/A;   GOLD SEED IMPLANT N/A 07/29/2022   Procedure: GOLD SEED IMPLANT;  Surgeon: Heloise Purpura, MD;  Location: [facility];  Service: Urology;  Laterality: N/A;  ONLY NEEDS 30 MIN    implantable loop recorder removal  12/13/2018   MDT LINQ removed in office by Dr Johney Frame   LASIK  2003   MELANOMA EXCISION N/A 10/04/2014   Procedure: WIDE LOCAL EXCISION OF UPPER BACK MELANOMA ;  Surgeon: Atilano Ina, MD;  Location: [facility];  Service: General;  Laterality: N/A;   PARATHYROIDECTOMY  2004   SPACE OAR INSTILLATION N/A 07/29/2022   Procedure: SPACE OAR INSTILLATION;  Surgeon: Heloise Purpura, MD;  Location: [facility];  Service: Urology;  Laterality: N/A;   TEE WITHOUT CARDIOVERSION N/A 06/12/2015   Procedure: TRANSESOPHAGEAL ECHOCARDIOGRAM (TEE);  Surgeon: Wendall Stade, MD;  Location: [facility];  Service: Cardiovascular;  Laterality: N/A;   TONSILLECTOMY     as child   TOTAL KNEE ARTHROPLASTY Left 12/22/2018   Procedure: TOTAL KNEE ARTHROPLASTY;  Surgeon: Jodi Geralds, MD;  Location: [facility];  Service: Orthopedics;  Laterality: Left;   TOTAL KNEE ARTHROPLASTY Right 01/23/2024   Procedure: RIGHT TOTAL KNEE ARTHROPLASTY;  Surgeon: Jodi Geralds, MD;  Location: [facility];  Service: Orthopedics;  Laterality: Right;    Prior to Admission medications   Medication Sig Start Date End Date Taking? Authorizing Provider  aspirin EC 81 MG tablet Take 1 tablet (81 mg total) by mouth 2 (two) times daily. 01/23/24  Yes Allena Katz, PA-C  buPROPion [facility] SR) 100 MG 12 hr tablet  Take 100 mg by mouth at bedtime. 10/28/23  Yes [provider]  Cholecalciferol (D2000 ULTRA STRENGTH) 50 MCG (2000 UT) CAPS Take 2,000 Units by mouth daily.   Yes [provider]  cyanocobalamin (VITAMIN B12) 1000 MCG tablet Take 1,000 mcg by mouth every morning.   Yes [provider]  diphenhydramine-acetaminophen (TYLENOL PM) 25-500 MG TABS tablet Take 2 tablets by mouth at bedtime.   Yes [provider]  Multiple Minerals-Vitamins (CAL-MAG-ZINC-D PO) Take 1 tablet by mouth daily.   Yes [provider]  Multiple Vitamin (MULTIVITAMIN) capsule  Take 1 capsule by mouth daily.   Yes [provider]  Omega-3 Fatty Acids (OMEGA 3 PO) Take 1 capsule by mouth every morning.   Yes [provider]  oxyCODONE (ROXICODONE) 5 MG immediate release tablet Take 1 tablet (5 mg total) by mouth every 4 (four) hours as needed for severe pain (pain score 7-10). 01/23/24  Yes Vear Clock, Eric K, PA-C  REPATHA SURECLICK 140 MG/ML SOAJ ADMINISTER 1 ML UNDER THE SKIN EVERY 14 DAYS Patient taking differently: Inject 140 mg into the skin every 14 (fourteen) days. 11/28/23  Yes Wendall Stade, MD  tadalafil (CIALIS) 20 MG tablet Take 20 mg by mouth daily as needed for erectile dysfunction.   Yes [provider]  tiZANidine (ZANAFLEX) 2 MG tablet Take 1 tablet (2 mg total) by mouth every 6 (six) hours as needed for muscle spasms. 01/23/24  Yes Dannielle Burn K, PA-C  triamcinolone (KENALOG) 0.1 % paste Use as directed 1 application in the mouth or throat 2 (two) times daily as needed (mouth sores).   Yes [provider]  Vitamin K, Phytonadione, 100 MCG TABS Take 100 mcg by mouth daily.    Yes [provider]    Current Facility-Administered Medications  Medication Dose Route Frequency Provider Last Rate Last Admin   acetaminophen (TYLENOL) tablet 650 mg  650 mg Oral Q6H PRN Kc, Ramesh, MD   650 mg at 01/27/24 1001   Or   acetaminophen (TYLENOL) suppository 650 mg  650 mg Rectal Q6H PRN Kc, Dayna Barker, MD       acetaminophen (TYLENOL) tablet 500 mg  500 mg Oral QHS PRN Kc, Dayna Barker, MD   500 mg at 01/29/24 2245   And   diphenhydrAMINE (BENADRYL) capsule 50 mg  50 mg Oral QHS PRN Lanae Boast, MD   50 mg at 01/29/24 2245   aspirin EC tablet 81 mg  81 mg Oral Daily Kc, Dayna Barker, MD   81 mg at 01/30/24 1021   bisacodyl (DULCOLAX) suppository 10 mg  10 mg Rectal Daily PRN Lanae Boast, MD   10 mg at 01/27/24 1654   buPROPion ER (WELLBUTRIN SR) 12 hr tablet 100 mg  100 mg Oral QHS Kc, Ramesh, MD   100 mg at 01/29/24 2245    cholecalciferol (VITAMIN D3) 25 MCG (1000 UNIT) tablet 2,000 Units  2,000 Units Oral Daily Kc, Ramesh, MD   2,000 Units at 01/30/24 1021   cyanocobalamin (VITAMIN B12) tablet 1,000 mcg  1,000 mcg Oral Daily Kc, Ramesh, MD   1,000 mcg at 01/30/24 1022   heparin ADULT infusion 100 units/mL (25000 units/21mL)  1,750 Units/hr Intravenous Continuous Laureen Ochs K, RPH 17.5 mL/hr at 01/30/24 0504 1,750 Units/hr at 01/30/24 0504   lactated ringers infusion   Intravenous Continuous Kc, Ramesh, MD 40 mL/hr at 01/30/24 1029 1,000 mL at 01/30/24 1029   methocarbamol (ROBAXIN) injection 500 mg  500 mg Intravenous Q6H PRN  Lanae Boast, MD   500 mg at 01/27/24 1231   multivitamin with minerals tablet 1 tablet  1 tablet Oral Daily Kc, Dayna Barker, MD   1 tablet at 01/30/24 1022   omega-3 acid ethyl esters (LOVAZA) capsule 1,000 mg  1,000 mg Oral Daily Kc, Ramesh, MD   1,000 mg at 01/30/24 1021   ondansetron (ZOFRAN) tablet 4 mg  4 mg Oral Q6H PRN Kc, Dayna Barker, MD       Or   ondansetron (ZOFRAN) injection 4 mg  4 mg Intravenous Q6H PRN Kc, Ramesh, MD   4 mg at 01/28/24 1825   Oral care mouth rinse  15 mL Mouth Rinse PRN Kc, Ramesh, MD       polyethylene glycol (MIRALAX / GLYCOLAX) packet 17 g  17 g Oral Daily Kc, Ramesh, MD   17 g at 01/30/24 1021   prochlorperazine (COMPAZINE) injection 5 mg  5 mg Intravenous Q6H PRN Anthoney Harada, NP        Allergies as of 01/26/2024 - Review Complete 01/26/2024  Allergen Reaction Noted   Atorvastatin Other (See Comments) 08/03/2021   Codeine Other (See Comments)    Erythromycin  09/23/2014   Rosuvastatin calcium  02/23/2022    Family History  Problem Relation Age of Onset   Heart Problems Mother        valve replacement & pacemaker   Osteoporosis Father    Other Maternal Grandfather        poss MI, per pt    Social History   Socioeconomic History   Marital status: Married    Spouse name: Not on file   Number of children: Not on file   Years of education: Not  on file   Highest education level: Not on file  Occupational History   Not on file  Tobacco Use   Smoking status: Former    Current packs/day: 0.00    Average packs/day: 1 pack/day for 6.0 years (6.0 ttl pk-yrs)    Types: Cigarettes    Start date: 09/26/1965    Quit date: 09/27/1971    Years since quitting: 52.3   Smokeless tobacco: Never  Vaping Use   Vaping status: Never Used  Substance and Sexual Activity   Alcohol use: Yes    Alcohol/week: 2.0 standard drinks of alcohol    Types: 2 Glasses of wine per week    Comment: 1 or  2 glasses per week   Drug use: No   Sexual activity: Not Currently  Other Topics Concern   Not on file  Social History Narrative   Pt lives in [place] with spouse.  Retired Catering manager for [employer].  Right handed    Social Drivers of Health   Financial Resource Strain: Not on file  Food Insecurity: No Food Insecurity (01/26/2024)   Hunger Vital Sign    Worried About Running Out of Food in the Last Year: Never true    Ran Out of Food in the Last Year: Never true  Transportation Needs: No Transportation Needs (01/26/2024)   PRAPARE - Administrator, Civil Service (Medical): No    Lack of Transportation (Non-Medical): No  Physical Activity: Not on file  Stress: Not on file  Social Connections: Socially Isolated (01/26/2024)   Social Connection and Isolation Panel [NHANES]    Frequency of Communication with Friends and Family: Once a week    Frequency of Social Gatherings with Friends and Family: Once a week    Attends Religious Services:  Never    Active Member of Clubs or Organizations: No    Attends Banker Meetings: Never    Marital Status: Married  Catering manager Violence: Not At Risk (01/26/2024)   Humiliation, Afraid, Rape, and Kick questionnaire    Fear of Current or Ex-Partner: No    Emotionally Abused: No    Physically Abused: No    Sexually Abused: No    Review of Systems: As per HPI, all others  negative  Physical Exam: Vital signs in last 24 hours: Temp:  [98 F (36.7 C)-98.6 F (37 C)] 98 F (36.7 C) (02/24 1420) Pulse Rate:  [79-109] 88 (02/24 1420) Resp:  [18-20] 20 (02/24 1420) BP: (121-127)/(74-87) 122/75 (02/24 1420) SpO2:  [90 %-97 %] 92 % (02/24 1420) Last BM Date : 01/30/24 General:   Alert,  Well-developed, well-nourished, pleasant and cooperative in NAD Head:  Normocephalic and atraumatic. Eyes:  Sclera clear, no icterus.   Conjunctiva pink. Ears:  Normal auditory acuity. Nose:  No deformity, discharge,  or lesions. Mouth:  No deformity or lesions.  Oropharynx pink & moist. Neck:  Supple; no masses or thyromegaly. Lungs:  Clear throughout to auscultation.   No wheezes, crackles, or rhonchi. No acute distress. Heart:  Regular rate and rhythm; no murmurs, clicks, rubs,  or gallops. Abdomen:  Soft, mild/moderate distention with tympany. No masses, hepatosplenomegaly or hernias noted. No peritonitis  Msk:  Symmetrical without gross deformities. Normal posture. Pulses:  Normal pulses noted. Extremities:  Without clubbing or edema. Neurologic:  Alert and  oriented x4; diffusely weak and deconditioned-appearing otherwise grossly normal neurologically. Skin:  Intact without significant lesions or rashes. Psych:  Alert and cooperative. Normal mood and affect.   Lab Results: Recent Labs    01/28/24 0127 01/29/24 0646 01/30/24 0536  WBC 6.8 6.2 6.9  HGB 10.5* 9.4* 10.0*  HCT 31.1* 29.4* 30.9*  PLT 350 329 392   BMET Recent Labs    01/28/24 0127 01/29/24 0646 01/30/24 0536  NA 136 134* 135  K 3.8 3.4* 3.7  CL 99 99 96*  CO2 27 26 29   GLUCOSE 119* 118* 114*  BUN 16 17 16   CREATININE 0.64 0.63 0.76  CALCIUM 8.5* 8.3* 8.8*   LFT No results for input(s): "PROT", "ALBUMIN", "AST", "ALT", "ALKPHOS", "BILITOT", "BILIDIR", "IBILI" in the last 72 hours. PT/INR No results for input(s): "LABPROT", "INR" in the last 72 hours.  Studies/Results: No results  found.  Impression:  Ileus.  Post-operative (knee replacement). Abdominal distention from #1 above. Pulmonary embolism, small branch RUL.  Plan:   Increase mobility as tolerated. Minimize narcotics (doesn't appear he's taking any now). Watch electrolytes (potassium low yesterday, ok today). Clear liquids only. Dulcolax suppositories at bedtime x 2 days. Pending clinical course, consider follow-up repeat abdominal xray. [facility] will follow.   LOS: 4 days   Luman Holway M  01/30/2024, 2:56 PM  Cell [phone] If no answer or after 5 PM call [phone]

## 2024-01-30 NOTE — Progress Notes (Signed)
 PROGRESS NOTE James Tate  ZOX:[id number] DOB: [birth year] DOA: 01/26/2024 PCP: Debroah Loop, DO  Brief Narrative/Hospital Course: 78 year old male with past medical history of right knee arthroplasty 01/23/24, cryptogenic stroke in April 2016, history of PFO,MR followed by cardiology as outpatient,arthritis, BPH OSA on CPAP, SNHL, Osteopenia, hyperparathyroidism hx  presented with generalized abdominal pain, nausea and vomiting and constipation, has not moved bowels since last Sunday (before surgery). He was also complained of shortness of breath today morning without chest pain. He is passing flatus. He had vomiting x 2 yesterday and none since.  He has not been moving much since surgery, has been taking aspirin return twice daily for DVT prophylaxis postop.  Patient already denies fever chills focal weakness, leg edema In the ED: Vital stable saturating 93-90% on room air.  Labs reviewed mild leukocytosis  otherwise stable, Underwent CT angio chest PE and CT abdomen pelvis: Positive for PE with a single focus of segmental to subsegmental embolus in the posterior  RUL, The mid small bowel is air and fluid-filled, although not overtly distended, loops measuring up to 3.7 cm in caliber without abrupt transition point, and scattered gas and stool present throughout the colon to the rectum. Findings are most consistent with ileus or enteritis.  Soft tissue stranding and gas loculations noted in the partially imaged proximal right vastus musculature. This is of uncertain significance, however concerning for gas-forming infection in the absence of recent surgical procedure or other explanation-but later reported attended indicating recent surgical procedure with right knee arthroplasty likely the cause for the gas.Prostatomegaly with biopsy marking clips or fiducials. Patient was placed on heparin, ivf antiemetics and admission was requested for PE and Ileus management.  Subjective: Patient complains of  ongoing bloating and distention and gets some nausea and has to stop  He is having loose bowel movement /passing flatus  Working with PT OT this morning Overnight afebrile BP stable Wife concerned patient was very lethargic earlier, placed on IV fluids-appears perked up   Assessment and Plan: Principal Problem:   Acute pulmonary embolism (HCC) Active Problems:   Cryptogenic stroke (HCC)   Primary osteoarthritis of left knee   Malignant neoplasm of prostate (HCC)   Hearing loss   Hypercholesterolemia   Osteopenia   Ileus (HCC)  Acute PE segmental to subsegmental RUL Acute DVT of right popliteal and right posterior tibial vein: VTE suspected to be 2/2 recent knee surgery on 2/17  and immobility despite being on aspirin twice daily.  Managing with heparin drip and will transition to DOAC once distention improves and tolerating p.o. fairly okay.  Echocardiogram unremarkable.   Ileus Constipation  Nausea vomiting abdomen pain concerning for ileus: CT abdomen no SBO-Episode of vomiting on 2/21 night bnut is still having abdominal distention and bloating nausea while drinking, passing flatus and loose BM.  Persistent symptoms will ask [facility] to see- if still persistent? Need to repeat CT abd - I had discussed with general surgeon Dr. Donell Beers over the weekend- no SBO on initial CT. Managing conservatively, due to persistent symptoms I will ask GI to evaluate he is on MiraLAX twice daily Dulcolax prn PR.started on ivf   Mild leucocytosis: Anemia hemoglobin drifting Hemoglobin stable leukocytosis resolved . Monitor cbc daily Recent Labs  Lab 01/26/24 0951 01/27/24 0602 01/28/24 0127 01/29/24 0646 01/30/24 0536  HGB 12.7* 10.6* 10.5* 9.4* 10.0*  HCT 37.0* 32.5* 31.1* 29.4* 30.9*    Hypokalemia: Resolved  Right knee arthroplasty 01/23/24 Ptot and pain control-has not been taking narcotics  on hold, continue Tylenol, muscle relaxant. Cont Aquacel dressing Working with PT OT.  No longer on  narcotics.   Cryptogenic stroke in April 2016 history of PFO/MR: followed by cardiology. Cont home asa.   Anxiety disorder: Lethargic this morning but is stable currently.  Continue home bupriprion.   BPH: Ensure he is Voiding well.   OSA on CPAP: Continue.  But has not been using CPAP for few days.   SNHL/Osteopenia: Cont supportive care.vitamin d,b12, repatha.   Hyperparathyroidism hx: Ca normal.  DVT prophylaxis: Heparin Code Status:   Code Status: Full Code Family Communication: plan of care discussed with patient/wife at bedside. Patient status is: Remains hospitalized because of severity of illness Level of care: Telemetry   Dispo: The patient is from: Home            Anticipated disposition: TBD.   Objective: Vitals last 24 hrs: Vitals:   01/29/24 2138 01/30/24 0556 01/30/24 1049 01/30/24 1115  BP: 127/87 121/74    Pulse: 89 79 (!) 109 92  Resp:  18    Temp: 98.6 F (37 C) 98.2 F (36.8 C)    TempSrc: Oral Oral    SpO2: 97% 90%    Weight:      Height:       Weight change:   Physical Examination: General exam: alert awake, oriented at baseline, older than stated age HEENT:Oral mucosa moist, Ear/Nose WNL grossly Respiratory system: Bilaterally clear BS,no use of accessory muscle Cardiovascular system: S1 & S2 +, No JVD. Gastrointestinal system: Abdomen soft distended, bowel sound present Nervous System: Alert, awake, moving all extremities,and following commands. Extremities: rt knee w/ Aquacel dressing swelling+ Skin: No rashes,no icterus. MSK: Normal muscle bulk,tone, power   Medications reviewed:  Scheduled Meds:  aspirin EC  81 mg Oral Daily   buPROPion ER  100 mg Oral QHS   cholecalciferol  2,000 Units Oral Daily   cyanocobalamin  1,000 mcg Oral Daily   multivitamin with minerals  1 tablet Oral Daily   omega-3 acid ethyl esters  1,000 mg Oral Daily   polyethylene glycol  17 g Oral Daily   Continuous Infusions:  heparin 1,750 Units/hr  (01/30/24 0504)   lactated ringers 1,000 mL (01/30/24 1029)    Diet Order             Diet full liquid Room service appropriate? Yes; Fluid consistency: Thin  Diet effective now                  Intake/Output Summary (Last 24 hours) at 01/30/2024 1129 Last data filed at 01/30/2024 1000 Gross per 24 hour  Intake 968.42 ml  Output 650 ml  Net 318.42 ml   Net IO Since Admission: 4,165.56 mL [01/30/24 1129]  Wt Readings from Last 3 Encounters:  01/26/24 85.7 kg  01/23/24 85.7 kg  01/10/24 85.7 kg     Unresulted Labs (From admission, onward)     Start     Ordered   01/29/24 0500  Heparin level (unfractionated)  Daily,   R     Question:  Specimen collection method  Answer:  Lab=Lab collect   01/27/24 1718   01/27/24 0500  Basic metabolic panel  Daily,   R      01/26/24 1335   01/27/24 0500  CBC  Daily,   R      01/26/24 1335           Data Reviewed: I have personally reviewed following labs and imaging studies CBC:  Recent Labs  Lab 01/26/24 0951 01/27/24 0602 01/28/24 0127 01/29/24 0646 01/30/24 0536  WBC 12.4* 9.1 6.8 6.2 6.9  NEUTROABS 11.0*  --   --   --   --   HGB 12.7* 10.6* 10.5* 9.4* 10.0*  HCT 37.0* 32.5* 31.1* 29.4* 30.9*  MCV 89.6 93.4 90.1 93.9 92.2  PLT 318 308 350 329 392   Basic Metabolic Panel:  Recent Labs  Lab 01/26/24 0951 01/27/24 0602 01/28/24 0127 01/29/24 0646 01/30/24 0536  NA 137 134* 136 134* 135  K 3.9 3.3* 3.8 3.4* 3.7  CL 99 99 99 99 96*  CO2 24 23 27 26 29   GLUCOSE 139* 128* 119* 118* 114*  BUN 17 17 16 17 16   CREATININE 0.80 0.71 0.64 0.63 0.76  CALCIUM 9.4 8.2* 8.5* 8.3* 8.8*   GFR: Estimated Creatinine Clearance: 84.9 mL/min (by C-G formula based on SCr of 0.76 mg/dL). Liver Function Tests:  Recent Labs  Lab 01/26/24 0951  AST 31  ALT 20  ALKPHOS 61  BILITOT 2.2*  PROT 8.1  ALBUMIN 3.8   Recent Labs  Lab 01/26/24 0951  LIPASE 28   Sepsis Labs: No results for input(s): "PROCALCITON", "LATICACIDVEN" in  the last 168 hours. Recent Results (from the past 240 hours)  Resp panel by RT-PCR (RSV, Flu A&B, Covid) Anterior Nasal Swab     Status: None   Collection Time: 01/26/24  9:38 AM   Specimen: Anterior Nasal Swab  Result Value Ref Range Status   SARS Coronavirus 2 by RT PCR NEGATIVE NEGATIVE Final    Comment: (NOTE) SARS-CoV-2 target nucleic acids are NOT DETECTED.  The SARS-CoV-2 RNA is generally detectable in upper respiratory specimens during the acute phase of infection. The lowest concentration of SARS-CoV-2 viral copies this assay can detect is 138 copies/mL. A negative result does not preclude SARS-Cov-2 infection and should not be used as the sole basis for treatment or other patient management decisions. A negative result may occur with  improper specimen collection/handling, submission of specimen other than nasopharyngeal swab, presence of viral mutation(s) within the areas targeted by this assay, and inadequate number of viral copies(<138 copies/mL). A negative result must be combined with clinical observations, patient history, and epidemiological information. The expected result is Negative.  Fact Sheet for Patients:  BloggerCourse.com  Fact Sheet for Healthcare Providers:  SeriousBroker.it  This test is no t yet approved or cleared by the [place] FDA and  has been authorized for detection and/or diagnosis of SARS-CoV-2 by FDA under an Emergency Use Authorization (EUA). This EUA will remain  in effect (meaning this test can be used) for the duration of the COVID-19 declaration under Section 564(b)(1) of the Act, 21 U.S.C.section 360bbb-3(b)(1), unless the authorization is terminated  or revoked sooner.       Influenza A by PCR NEGATIVE NEGATIVE Final   Influenza B by PCR NEGATIVE NEGATIVE Final    Comment: (NOTE) The Xpert Xpress SARS-CoV-2/FLU/RSV plus assay is intended as an aid in the diagnosis of  influenza from Nasopharyngeal swab specimens and should not be used as a sole basis for treatment. Nasal washings and aspirates are unacceptable for Xpert Xpress SARS-CoV-2/FLU/RSV testing.  Fact Sheet for Patients: BloggerCourse.com  Fact Sheet for Healthcare Providers: SeriousBroker.it  This test is not yet approved or cleared by the [place] FDA and has been authorized for detection and/or diagnosis of SARS-CoV-2 by FDA under an Emergency Use Authorization (EUA). This EUA will remain in effect (meaning this test  can be used) for the duration of the COVID-19 declaration under Section 564(b)(1) of the Act, 21 U.S.C. section 360bbb-3(b)(1), unless the authorization is terminated or revoked.     Resp Syncytial Virus by PCR NEGATIVE NEGATIVE Final    Comment: (NOTE) Fact Sheet for Patients: BloggerCourse.com  Fact Sheet for Healthcare Providers: SeriousBroker.it  This test is not yet approved or cleared by the [place] FDA and has been authorized for detection and/or diagnosis of SARS-CoV-2 by FDA under an Emergency Use Authorization (EUA). This EUA will remain in effect (meaning this test can be used) for the duration of the COVID-19 declaration under Section 564(b)(1) of the Act, 21 U.S.C. section 360bbb-3(b)(1), unless the authorization is terminated or revoked.  Performed at [facility], [address] [place], [place] [address]     Antimicrobials/Microbiology: Anti-infectives (From admission, onward)    None      No results found for: "SDES", "SPECREQUEST", "CULT", "REPTSTATUS"   Radiology Studies: No results found.    LOS: 4 days   Total time spent in review of labs and imaging, patient evaluation, formulation of plan, documentation and communication with family: 35 minutes  Lanae Boast, MD  [facility]  01/30/2024, 11:29  AM

## 2024-01-30 NOTE — Progress Notes (Signed)
 Physical Therapy Treatment Patient Details Name: James Tate MRN: [id number] DOB: [birth year] Today's Date: 01/30/2024   History of Present Illness 78 yo male who presented with generalized abdominal pain, nausea and vomiting and constipation and admitted 01/26/24 for Acute PE segmental to subsegmental RUL  and Acute DVT of right popliteal and right posterior tibial vein.  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 Pt needing min A to power up and shift weight anterior, noted to use rocking momentum and with posterior lean when powering up, RLE slightly extended for comfort. Once standing, pt reports fatigue and initial dizziness, able to stand statically for 1 minute but pt continues to report fatigue and declines ambulation. Pt reports more fatigued than he though and requesting to rest at this time. Pt on RA with SpO2 91-97% during session.   If plan is discharge home, recommend the following: A little help with walking and/or transfers;A little help with bathing/dressing/bathroom;Assistance with cooking/housework;Assist for transportation;Help with stairs or ramp for entrance   Can travel by private vehicle        Equipment Recommendations  None recommended by PT    Recommendations for Other Services       Precautions / Restrictions Precautions Precautions: Knee;Fall Precaution/Restrictions Comments: monitor SpO2 Restrictions Weight Bearing Restrictions Per Provider Order: No     Mobility  Bed Mobility               General bed mobility comments: in recliner upon arrival    Transfers Overall transfer level: Needs assistance Equipment used: Rolling walker (2 wheels) Transfers: Sit  to/from Stand Sit to Stand: Min assist           General transfer comment: min A to power up to stand, RLE slightly extended for comfort, slow to rise using rocking momentum, mild posterior lean needing min A to shift forward with powering up to prevent posterior LOB, fatigued wtih standing and declines ambulation    Ambulation/Gait                   Stairs             Wheelchair Mobility     Tilt Bed    Modified Rankin (Stroke Patients Only)       Balance Overall balance assessment: Needs assistance         Standing balance support: Bilateral upper extremity supported, During functional activity, Reliant on assistive device for balance Standing balance-Leahy Scale: Poor                              Communication Communication Communication: No apparent difficulties  Cognition Arousal: Alert Behavior During Therapy: WFL for tasks assessed/performed   PT - Cognitive impairments: No apparent impairments                         Following commands: Intact      Cueing    Exercises      General Comments General comments (skin integrity, edema, etc.): on RA with SpO2 91-97% with sitting and static standing, HR max 106 noted      Pertinent Vitals/Pain Pain Assessment Pain  Assessment: Faces Faces Pain Scale: Hurts a little bit Pain Location: R knee Pain Descriptors / Indicators: Discomfort Pain Intervention(s): Limited activity within patient's tolerance, Monitored during session, Ice applied, Premedicated before session    Home Living                          Prior Function            PT Goals (current goals can now be found in the care plan section) Progress towards PT goals: Progressing toward goals    Frequency    Min 1X/week      PT Plan      Co-evaluation              AM-PAC PT "6 Clicks" Mobility   Outcome Measure  Help needed turning from your back to your side while in a flat  bed without using bedrails?: None Help needed moving from lying on your back to sitting on the side of a flat bed without using bedrails?: A Little Help needed moving to and from a bed to a chair (including a wheelchair)?: A Little Help needed standing up from a chair using your arms (e.g., wheelchair or bedside chair)?: A Little Help needed to walk in hospital room?: A Little Help needed climbing 3-5 steps with a railing? : A Lot 6 Click Score: 18    End of Session Equipment Utilized During Treatment: Gait belt Activity Tolerance: Patient tolerated treatment well;Patient limited by fatigue Patient left: in chair;with call bell/phone within reach;with family/visitor present Nurse Communication: Mobility status PT Visit Diagnosis: Difficulty in walking, not elsewhere classified (R26.2)     Time: 4098-1191 PT Time Calculation (min) (ACUTE ONLY): 12 min  Charges:    $Therapeutic Activity: 8-22 mins PT General Charges $$ ACUTE PT VISIT: 1 Visit                     Tori Adan Beal PT, DPT 01/30/24, 11:31 AM

## 2024-01-30 NOTE — Progress Notes (Signed)
 PHARMACY - ANTICOAGULATION CONSULT NOTE  Pharmacy Consult for Heparin Indication: pulmonary embolus, DVT  Allergies  Allergen Reactions   Atorvastatin Other (See Comments)    Aches in legs   Codeine Other (See Comments)    Headache.    Erythromycin     Childhood allergy - unknown   Rosuvastatin Calcium     Memory problem (03/2020) Aches in   legs    Patient Measurements: Height: 6' (182.9 cm) Weight: 85.7 kg (189 lb) IBW/kg (Calculated) : 77.6 Heparin Dosing Weight: Use total body weight 85 kg  Vital Signs: Temp: 98.2 F (36.8 C) (02/24 0556) Temp Source: Oral (02/24 0556) BP: 121/74 (02/24 0556) Pulse Rate: 79 (02/24 0556)  Labs: Recent Labs    01/28/24 0127 01/28/24 1010 01/29/24 0646 01/30/24 0536  HGB 10.5*  --  9.4* 10.0*  HCT 31.1*  --  29.4* 30.9*  PLT 350  --  329 392  HEPARINUNFRC 0.44 0.33 0.52 0.37  CREATININE 0.64  --  0.63 0.76    Estimated Creatinine Clearance: 84.9 mL/min (by C-G formula based on SCr of 0.76 mg/dL).   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   Flat feet    Glaucoma    left eye   History of colon polyps    History of hyperparathyroidism    Melanoma of back (HCC)    MR (mitral regurgitation) 05/16/2018   Moderate noted on ECHO   MVP (mitral valve prolapse) 05/16/2018   Noted on ECHO   OSA on CPAP    cpap set on 4   Osteopenia    resolved with medical therapy (calcium and fosamax)   PFO (patent foramen ovale)    Sensorineural hearing loss (SNHL) of both ears    Does Not wear HAs    Medications: No anticoagulants PTA -Pt was prescribed ASA for DVT ppx post-op  Assessment:  Pt is a 61 yoM presented to ED on 2/20 for N/V, constipation, and SOB. PMH significant for PFO, cryptogenic stroke in 2016, recent TKA on 01/23/24. Pt was prescribed ASA 81 mg PO BID post-ortho surgery for DVT ppx.   Venous  doppler reveals findings consistent with acute DVT involving right popliteal vein and right posterior tibial veins. CTA revealed PE, no evidence of RHS. Pharmacy consulted to dose heparin.    Today, 01/27/2024:  Heparin level = 0.37 remains therapeutic on heparin infusion of 1750 units/hr CBC: Hgb low and trending up, Plt WNL ASA 81 mg PO daily to continue given history of cryptogenic CVA No bleeding reported Copay check for DOAC done, see note from patient advocate team on 2/21  Goal of Therapy:  Heparin level 0.3-0.7 units/ml  Monitor platelets by anticoagulation protocol: Yes   Plan: Continue heparin infusion at current rate of 1750 units/hr CBC, heparin level daily Monitor for signs of bleeding   Adalberto Cole, PharmD, BCPS 01/30/2024 8:41 AM

## 2024-01-31 DIAGNOSIS — K567 Ileus, unspecified: Secondary | ICD-10-CM | POA: Diagnosis not present

## 2024-01-31 DIAGNOSIS — K9189 Other postprocedural complications and disorders of digestive system: Secondary | ICD-10-CM | POA: Diagnosis not present

## 2024-01-31 DIAGNOSIS — R933 Abnormal findings on diagnostic imaging of other parts of digestive tract: Secondary | ICD-10-CM | POA: Diagnosis not present

## 2024-01-31 DIAGNOSIS — I2699 Other pulmonary embolism without acute cor pulmonale: Secondary | ICD-10-CM | POA: Diagnosis not present

## 2024-01-31 LAB — CBC
HCT: 28.6 % — ABNORMAL LOW (ref 39.0–52.0)
Hemoglobin: 9.3 g/dL — ABNORMAL LOW (ref 13.0–17.0)
MCH: 30.8 pg (ref 26.0–34.0)
MCHC: 32.5 g/dL (ref 30.0–36.0)
MCV: 94.7 fL (ref 80.0–100.0)
Platelets: 377 10*3/uL (ref 150–400)
RBC: 3.02 MIL/uL — ABNORMAL LOW (ref 4.22–5.81)
RDW: 13.1 % (ref 11.5–15.5)
WBC: 7.2 10*3/uL (ref 4.0–10.5)
nRBC: 0 % (ref 0.0–0.2)

## 2024-01-31 LAB — BASIC METABOLIC PANEL
Anion gap: 11 (ref 5–15)
BUN: 13 mg/dL (ref 8–23)
CO2: 24 mmol/L (ref 22–32)
Calcium: 8.5 mg/dL — ABNORMAL LOW (ref 8.9–10.3)
Chloride: 101 mmol/L (ref 98–111)
Creatinine, Ser: 0.63 mg/dL (ref 0.61–1.24)
GFR, Estimated: >60 mL/min (ref >60)
Glucose, Bld: 98 mg/dL (ref 70–99)
Potassium: 4.1 mmol/L (ref 3.5–5.1)
Sodium: 136 mmol/L (ref 135–145)

## 2024-01-31 LAB — HEPARIN LEVEL (UNFRACTIONATED): Heparin Unfractionated: 0.37 [IU]/mL (ref 0.30–0.70)

## 2024-01-31 MED ORDER — APIXABAN 5 MG PO TABS
10.0000 mg | ORAL_TABLET | Freq: Two times a day (BID) | ORAL | Status: DC
  Administered 2024-01-31 – 2024-02-01 (×3): 10 mg via ORAL
  Filled 2024-01-31 (×3): qty 2

## 2024-01-31 MED ORDER — APIXABAN 5 MG PO TABS: 5.0000 mg | ORAL_TABLET | Freq: Two times a day (BID) | ORAL | Status: DC

## 2024-01-31 NOTE — Plan of Care (Signed)
 Pt reported multiple loose stools this shift, declined suppository. Pt is more ambulatory with walker, wife remains at bedside.    Problem: Education: Goal: Knowledge of General Education information will improve Description: Including pain rating scale, medication(s)/side effects and non-pharmacologic comfort measures Outcome: Progressing   Problem: Clinical Measurements: Goal: Ability to maintain clinical measurements within normal limits will improve Outcome: Progressing   Problem: Activity: Goal: Risk for activity intolerance will decrease Outcome: Progressing   Problem: Nutrition: Goal: Adequate nutrition will be maintained Outcome: Progressing   Problem: Elimination: Goal: Will not experience complications related to bowel motility Outcome: Progressing   Problem: Safety: Goal: Ability to remain free from injury will improve Outcome: Progressing

## 2024-01-31 NOTE — Plan of Care (Signed)
  Problem: Education: Goal: Knowledge of General Education information will improve Description: Including pain rating scale, medication(s)/side effects and non-pharmacologic comfort measures Outcome: Progressing   Problem: Nutrition: Goal: Adequate nutrition will be maintained Outcome: Progressing   Problem: Elimination: Goal: Will not experience complications related to bowel motility Outcome: Progressing   Problem: Pain Managment: Goal: General experience of comfort will improve and/or be controlled Outcome: Progressing   Problem: Safety: Goal: Ability to remain free from injury will improve Outcome: Progressing   Problem: Skin Integrity: Goal: Risk for impaired skin integrity will decrease Outcome: Progressing

## 2024-01-31 NOTE — Progress Notes (Addendum)
 Mobility Specialist - Progress Note   01/31/24 1006  Mobility  Activity Ambulated with assistance to bathroom  Level of Assistance Standby assist, set-up cues, supervision of patient - no hands on  Assistive Device Front wheel walker  Distance Ambulated (ft) 10 ft  Activity Response Tolerated well  Mobility Referral Yes  Mobility visit 1 Mobility  Mobility Specialist Start Time (ACUTE ONLY) 0959  Mobility Specialist Stop Time (ACUTE ONLY) 1005  Mobility Specialist Time Calculation (min) (ACUTE ONLY) 6 min   Pt received in bed requesting assistance to the bathroom. No complaints during session. Instructed pt to pull call bell when finished. Pt to bathroom after session with all needs met.    [facility]

## 2024-01-31 NOTE — Progress Notes (Signed)
 Subjective: Passing flatus and loose stools. Feels abdominal distention is slowly decreasing  Objective: Vital signs in last 24 hours: Temp:  [97.8 F (36.6 C)-98.5 F (36.9 C)] 98.5 F (36.9 C) (02/25 1336) Pulse Rate:  [80-91] 86 (02/25 1336) Resp:  [18-20] 18 (02/25 1336) BP: (125-136)/(68-79) 128/75 (02/25 1336) SpO2:  [91 %-92 %] 92 % (02/25 1336) Weight change:  Last BM Date : 01/31/24  PE: GEN:  NAD ABD:  mild-moderate distention abdomen with tympany; no peritonitis  Lab Results: CBC    Component Value Date/Time   WBC 7.2 01/31/2024 0542   RBC 3.02 (L) 01/31/2024 0542   HGB 9.3 (L) 01/31/2024 0542   HCT 28.6 (L) 01/31/2024 0542   PLT 377 01/31/2024 0542   MCV 94.7 01/31/2024 0542   MCH 30.8 01/31/2024 0542   MCHC 32.5 01/31/2024 0542   RDW 13.1 01/31/2024 0542   LYMPHSABS 0.4 (L) 01/26/2024 0951   MONOABS 0.8 01/26/2024 0951   EOSABS 0.1 01/26/2024 0951   BASOSABS 0.0 01/26/2024 0951  CMP     Component Value Date/Time   NA 136 01/31/2024 0542   K 4.1 01/31/2024 0542   CL 101 01/31/2024 0542   CO2 24 01/31/2024 0542   GLUCOSE 98 01/31/2024 0542   BUN 13 01/31/2024 0542   CREATININE 0.63 01/31/2024 0542   CALCIUM 8.5 (L) 01/31/2024 0542   PROT 8.1 01/26/2024 0951   PROT 7.5 06/17/2021 1059   ALBUMIN 3.8 01/26/2024 0951   ALBUMIN 4.8 (H) 06/17/2021 1059   AST 31 01/26/2024 0951   ALT 20 01/26/2024 0951   ALKPHOS 61 01/26/2024 0951   BILITOT 2.2 (H) 01/26/2024 0951   BILITOT 1.5 (H) 06/17/2021 1059   EGFR 93.0 04/28/2023 1119   GFRNONAA >60 01/31/2024 0542   Assessment:  Ileus.  Post-operative (knee replacement). Abdominal distention from #1 above. Pulmonary embolism, small branch RUL.  Plan:   Increase mobility as tolerated. Minimize narcotics. Dulcolax suppositories. Advance to soft diet. Pending clinical course, may be able to go home tomorrow from GI perspective. [facility] will follow.   Freddy Jaksch 01/31/2024, 2:58 PM   Cell  [phone] If no answer or after 5 PM call [phone]

## 2024-01-31 NOTE — Progress Notes (Addendum)
 PHARMACY - ANTICOAGULATION CONSULT NOTE  Pharmacy Consult for Heparin Indication: pulmonary embolus, DVT  Allergies  Allergen Reactions   Atorvastatin Other (See Comments)    Aches in legs   Codeine Other (See Comments)    Headache.    Erythromycin     Childhood allergy - unknown   Rosuvastatin Calcium     Memory problem (03/2020) Aches in   legs    Patient Measurements: Height: 6' (182.9 cm) Weight: 85.7 kg (189 lb) IBW/kg (Calculated) : 77.6 Heparin Dosing Weight: Use total body weight 85 kg  Vital Signs: Temp: 98.1 F (36.7 C) (02/25 0636) Temp Source: Oral (02/25 0636) BP: 125/68 (02/25 0636) Pulse Rate: 80 (02/25 0636)  Labs: Recent Labs    01/29/24 0646 01/30/24 0536 01/31/24 0542  HGB 9.4* 10.0* 9.3*  HCT 29.4* 30.9* 28.6*  PLT 329 392 377  HEPARINUNFRC 0.52 0.37 0.37  CREATININE 0.63 0.76 0.63    Estimated Creatinine Clearance: 84.9 mL/min (by C-G formula based on SCr of 0.63 mg/dL).   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   Flat feet    Glaucoma    left eye   History of colon polyps    History of hyperparathyroidism    Melanoma of back (HCC)    MR (mitral regurgitation) 05/16/2018   Moderate noted on ECHO   MVP (mitral valve prolapse) 05/16/2018   Noted on ECHO   OSA on CPAP    cpap set on 4   Osteopenia    resolved with medical therapy (calcium and fosamax)   PFO (patent foramen ovale)    Sensorineural hearing loss (SNHL) of both ears    Does Not wear HAs    Medications: No anticoagulants PTA -Pt was prescribed ASA for DVT ppx post-op  Assessment:  Pt is a 56 yoM presented to ED on 2/20 for N/V, constipation, and SOB. PMH significant for PFO, cryptogenic stroke in 2016, recent TKA on 01/23/24. Pt was prescribed ASA 81 mg PO BID post-ortho surgery for DVT ppx.   Venous doppler reveals findings consistent with  acute DVT involving right popliteal vein and right posterior tibial veins. CTA revealed PE, no evidence of RHS. Pharmacy consulted to dose heparin.    Today, 01/27/2024:  Heparin level = 0.37 remains therapeutic on heparin infusion of 1750 units/hr CBC: Hgb low/steady, Plt WNL ASA 81 mg PO daily to continue given history of cryptogenic CVA No bleeding or complications of therapy per nurse Copay check for DOAC done, see note from patient advocate team on 2/21  Goal of Therapy:  Heparin level 0.3-0.7 units/ml  Monitor platelets by anticoagulation protocol: Yes   Plan: Continue heparin infusion at current rate of 1750 units/hr Monitor daily heparin level, CBC, signs/symptoms of bleeding   Thank you for allowing pharmacy to be a part of this patient's care.  Selinda Eon, PharmD, BCPS Clinical Pharmacist [place] 01/31/2024 8:46 AM  Addendum:  Received consult to transition to apixaban Heparin level therapeutic 2/22-today  Plan: Discontinue IV heparin and associated labs Apixaban 10 mg po BID (starting at the time IV heparin is turned off) for 4 days followed by apixaban 5 mg po BID Pharmacy to provide medication counseling With no dose adjustments anticipated, Pharmacy will sign off consult and continue to monitor CBC as ordered by provider along with signs & symptoms of bleeding  Thank you for allowing pharmacy to be a part of this patient's care.  Selinda Eon, PharmD, BCPS Clinical Pharmacist [place] 01/31/2024 1:27 PM

## 2024-01-31 NOTE — Progress Notes (Signed)
 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 He was also complained of shortness of breath today morning without chest pain. He is passing flatus. He had vomiting x 2 yesterday and none since.  He has not been moving much since surgery, has been taking aspirin return twice daily for DVT prophylaxis postop.  Patient already denies fever chills focal weakness, leg edema In the ED: Vital stable saturating 93-90% on room air.  Labs reviewed mild leukocytosis  otherwise stable, Underwent CT angio chest PE and CT abdomen pelvis: Positive for PE with a single focus of segmental to subsegmental embolus in the posterior  RUL, The mid small bowel is air and fluid-filled, although not overtly distended, loops measuring up to 3.7 cm in caliber without abrupt transition point, and scattered gas and stool present throughout the colon to the rectum. Findings are most consistent with ileus or enteritis.  Soft tissue stranding and gas loculations noted in the partially imaged proximal right vastus musculature. This is of uncertain significance, however concerning for gas-forming infection in the absence of recent surgical procedure or other explanation-but later reported attended indicating recent surgical procedure with right knee arthroplasty likely the cause for the gas.Prostatomegaly with biopsy marking clips or fiducials. Patient was placed on heparin, ivf antiemetics and admission was requested for PE and Ileus management.  Subjective: Patient reports he is  feeling better tolerating diet, having bowel movement no nausea,passing flatus.  Vitals stable and afebrile. They feel better overall.  Assessment and Plan: Principal Problem:   Acute pulmonary embolism (HCC) Active Problems:   Cryptogenic stroke (HCC)   Primary osteoarthritis of left knee   Malignant neoplasm of prostate (HCC)   Hearing loss   Hypercholesterolemia   Osteopenia   Ileus (HCC)  Acute PE segmental to subsegmental RUL Acute DVT of right popliteal and right posterior tibial vein: VTE suspected to be 2/2 recent knee surgery on 2/17  and immobility despite being on aspirin twice daily.  Managing with heparin drip> since ileus improving will change to DOAC. Echocardiogram unremarkable.   Ileus Constipation  Nausea vomiting abdomen pain concerning for ileus: CT abdomen no SBO-Episode of vomiting on 2/21 night.  Overall he feels much better, appreciate GI input.  Continue diet as tolerated and advance slowly, continue Dulcolax/MiraLAX and gentle ivf   Mild leucocytosis: Anemia hemoglobin drifting Hemoglobin stable leukocytosis resolved . Monitor cbc daily Recent Labs  Lab 01/27/24 0602 01/28/24 0127 01/29/24 0646 01/30/24 0536 01/31/24 0542  HGB 10.6* 10.5* 9.4* 10.0* 9.3*  HCT 32.5* 31.1* 29.4* 30.9* 28.6*    Hypokalemia: Resolved  Right knee arthroplasty 01/23/24 Ptot and pain control-has not been taking narcotics on hold, continue Tylenol, muscle relaxant. Cont Aquacel dressing Working with PT OT.  No longer on narcotics.   Cryptogenic stroke in April 2016 history of PFO/MR: followed by cardiology. Cont home asa.   Anxiety disorder: Stable mood.Continue home bupriprion.   BPH: Ensure he is Voiding well.   OSA on CPAP: Continue.  But has not been using CPAP for few days.   SNHL/Osteopenia: Cont supportive care.vitamin d,b12, repatha.   Hyperparathyroidism hx: Ca normal.  DVT prophylaxis:  Heparin Code Status:   Code Status: Full Code Family  Communication: plan of care discussed with patient/wife at bedside. Patient status is: Remains hospitalized because of severity of illness Level of care: Telemetry   Dispo: The patient is from: Home            Anticipated disposition: home 1-2 days  Objective: Vitals last 24 hrs: Vitals:   01/30/24 1420 01/30/24 2224 01/31/24 0636 01/31/24 1336  BP: 122/75 136/79 125/68 128/75  Pulse: 88 91 80 86  Resp: 20 20 20 18   Temp: 98 F (36.7 C) 97.8 F (36.6 C) 98.1 F (36.7 C) 98.5 F (36.9 C)  TempSrc: Oral Oral Oral Oral  SpO2: 92% 92% 91% 92%  Weight:      Height:       Weight change:   Physical Examination: General exam: alert awake, oriented  HEENT:Oral mucosa moist, Ear/Nose WNL grossly Respiratory system: Bilaterally clear BS,no use of accessory muscle Cardiovascular system: S1 & S2 +, No JVD. Gastrointestinal system: Abdomen soft, some distention bowel sounds present nontender  Nervous System: Alert, awake, moving all extremities,and following commands. Extremities: rt knee w/ aquacel dressing+, LE edema neg,distal peripheral pulses palpable and warm.  Skin: No rashes,no icterus. MSK: Normal muscle bulk,tone, power   Medications reviewed:  Scheduled Meds:  apixaban  10 mg Oral BID   Followed by   Melene Muller ON 02/04/2024] apixaban  5 mg Oral BID   aspirin EC  81 mg Oral Daily   bisacodyl  10 mg Rectal Q2200   buPROPion ER  100 mg Oral QHS   cholecalciferol  2,000 Units Oral Daily   cyanocobalamin  1,000 mcg Oral Daily   multivitamin with minerals  1 tablet Oral Daily   omega-3 acid ethyl esters  1,000 mg Oral Daily   polyethylene glycol  17 g Oral Daily  Continuous Infusions:   Diet Order             Diet full liquid Room service appropriate? Yes; Fluid consistency: Thin  Diet effective now                  Intake/Output Summary (Last 24 hours) at 01/31/2024 1353 Last data filed at 01/31/2024 1348 Gross per 24 hour  Intake 1996.72 ml  Output 350 ml  Net  1646.72 ml   Net IO Since Admission: 5,812.28 mL [01/31/24 1353]  Wt Readings from Last 3 Encounters:  01/26/24 85.7 kg  01/23/24 85.7 kg  01/10/24 85.7 kg     Unresulted Labs (From admission, onward)     Start     Ordered   02/01/24 0500  Basic metabolic panel  Daily,   R     Question:  Specimen collection method  Answer:  Lab=Lab collect   01/31/24 0909   02/01/24 0500  CBC  Daily,   R     Question:  Specimen collection method  Answer:  Lab=Lab collect   01/31/24 0909           Data Reviewed: I have personally reviewed following labs and imaging studies CBC: Recent Labs  Lab 01/26/24 0951 01/27/24 0602 01/28/24 0127 01/29/24 0646 01/30/24 0536 01/31/24 0542  WBC 12.4* 9.1 6.8 6.2 6.9 7.2  NEUTROABS 11.0*  --   --   --   --   --   HGB 12.7* 10.6* 10.5* 9.4* 10.0* 9.3*  HCT 37.0* 32.5* 31.1* 29.4* 30.9* 28.6*  MCV 89.6 93.4 90.1 93.9 92.2 94.7  PLT 318 308 350 329  392 377   Basic Metabolic Panel:  Recent Labs  Lab 01/27/24 0602 01/28/24 0127 01/29/24 0646 01/30/24 0536 01/31/24 0542  NA 134* 136 134* 135 136  K 3.3* 3.8 3.4* 3.7 4.1  CL 99 99 99 96* 101  CO2 23 27 26 29 24   GLUCOSE 128* 119* 118* 114* 98  BUN 17 16 17 16 13   CREATININE 0.71 0.64 0.63 0.76 0.63  CALCIUM 8.2* 8.5* 8.3* 8.8* 8.5*   GFR: Estimated Creatinine Clearance: 84.9 mL/min (by C-G formula based on SCr of 0.63 mg/dL). Liver Function Tests:  Recent Labs  Lab 01/26/24 0951  AST 31  ALT 20  ALKPHOS 61  BILITOT 2.2*  PROT 8.1  ALBUMIN 3.8   Recent Labs  Lab 01/26/24 0951  LIPASE 28   Sepsis Labs: No results for input(s): "PROCALCITON", "LATICACIDVEN" in the last 168 hours. Recent Results (from the past 240 hours)  Resp panel by RT-PCR (RSV, Flu A&B, Covid) Anterior Nasal Swab     Status: None   Collection Time: 01/26/24  9:38 AM   Specimen: Anterior Nasal Swab  Result Value Ref Range Status   SARS Coronavirus 2 by RT PCR NEGATIVE NEGATIVE Final    Comment:  (NOTE) SARS-CoV-2 target nucleic acids are NOT DETECTED.  The SARS-CoV-2 RNA is generally detectable in upper respiratory specimens during the acute phase of infection. The lowest concentration of SARS-CoV-2 viral copies this assay can detect is 138 copies/mL. A negative result does not preclude SARS-Cov-2 infection and should not be used as the sole basis for treatment or other patient management decisions. A negative result may occur with  improper specimen collection/handling, submission of specimen other than nasopharyngeal swab, presence of viral mutation(s) within the areas targeted by this assay, and inadequate number of viral copies(<138 copies/mL). A negative result must be combined with clinical observations, patient history, and epidemiological information. The expected result is Negative.  Fact Sheet for Patients:  BloggerCourse.com  Fact Sheet for Healthcare Providers:  SeriousBroker.it  This test is no t yet approved or cleared by the [place] FDA and  has been authorized for detection and/or diagnosis of SARS-CoV-2 by FDA under an Emergency Use Authorization (EUA). This EUA will remain  in effect (meaning this test can be used) for the duration of the COVID-19 declaration under Section 564(b)(1) of the Act, 21 U.S.C.section 360bbb-3(b)(1), unless the authorization is terminated  or revoked sooner.       Influenza A by PCR NEGATIVE NEGATIVE Final   Influenza B by PCR NEGATIVE NEGATIVE Final    Comment: (NOTE) The Xpert Xpress SARS-CoV-2/FLU/RSV plus assay is intended as an aid in the diagnosis of influenza from Nasopharyngeal swab specimens and should not be used as a sole basis for treatment. Nasal washings and aspirates are unacceptable for Xpert Xpress SARS-CoV-2/FLU/RSV testing.  Fact Sheet for Patients: BloggerCourse.com  Fact Sheet for Healthcare  Providers: SeriousBroker.it  This test is not yet approved or cleared by the [place] FDA and has been authorized for detection and/or diagnosis of SARS-CoV-2 by FDA under an Emergency Use Authorization (EUA). This EUA will remain in effect (meaning this test can be used) for the duration of the COVID-19 declaration under Section 564(b)(1) of the Act, 21 U.S.C. section 360bbb-3(b)(1), unless the authorization is terminated or revoked.     Resp Syncytial Virus by PCR NEGATIVE NEGATIVE Final    Comment: (NOTE) Fact Sheet for Patients: BloggerCourse.com  Fact Sheet for Healthcare Providers: SeriousBroker.it  This test is not yet  approved or cleared by the [place] and has been authorized for detection and/or diagnosis of SARS-CoV-2 by FDA under an Emergency Use Authorization (EUA). This EUA will remain in effect (meaning this test can be used) for the duration of the COVID-19 declaration under Section 564(b)(1) of the Act, 21 U.S.C. section 360bbb-3(b)(1), unless the authorization is terminated or revoked.  Performed at [facility], [address] [place], [place] [address]     Antimicrobials/Microbiology: Anti-infectives (From admission, onward)    None      No results found for: "SDES", "SPECREQUEST", "CULT", "REPTSTATUS"   Radiology Studies: No results found.    LOS: 5 days   Total time spent in review of labs and imaging, patient evaluation, formulation of plan, documentation and communication with family: 35 minutes  Lanae Boast, MD  [facility]  01/31/2024, 1:53 PM

## 2024-01-31 NOTE — Progress Notes (Signed)
 Occupational Therapy Treatment Patient Details Name: James Tate MRN: [id number] DOB: [birth year] Today's Date: 01/31/2024   History of present illness 78 yo male who presented with generalized abdominal pain, nausea and vomiting and constipation and admitted 01/26/24 for Acute PE segmental to subsegmental RUL  and Acute DVT of right popliteal and right posterior tibial vein.  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 Daughter present in room to purchase total hip kit for patient. Patient would continue to benefit from skilled OT services at this time while admitted and after d/c to address noted deficits in order to improve overall safety and independence in ADLs. Patient's discharge plan remains appropriate at this time. OT will continue to follow acutely.        If plan is discharge home, recommend the following:  A little help with walking and/or transfers;A little help with bathing/dressing/bathroom;Assistance with cooking/housework;Assist for transportation   Equipment Recommendations  Other (comment) (total hip kit)       Precautions / Restrictions Precautions Precautions: Knee;Fall Precaution/Restrictions Comments: monitor SpO2 Restrictions Weight Bearing Restrictions Per Provider Order: No       Mobility Bed Mobility Overal bed mobility: Needs Assistance Bed Mobility: Supine to Sit, Sit to Supine     Supine to sit: Supervision Sit to supine: Supervision              Balance Overall balance assessment: Needs assistance Sitting-balance support: Feet supported Sitting balance-Leahy Scale: Good     Standing balance support: During functional activity Standing balance-Leahy Scale: Fair           ADL  either performed or assessed with clinical judgement   ADL Overall ADL's : Needs assistance/impaired               Lower Body Bathing Details (indicate cue type and reason): Pt and spouse educated on long handled bath sponge/brush for ease reaching lower legs and feet.       Lower Body Dressing Details (indicate cue type and reason): educated on using reacher and sock aid for ADLs. patient was able to participate in simulated donning pants/socks with AE. Toilet Transfer: Supervision/safety;Rolling walker (2 wheels);Ambulation Toilet Transfer Details (indicate cue type and reason): with RW to bathroom with BSC over toilet Toileting- Clothing Manipulation and Hygiene: Contact guard assist;Sit to/from stand                Cognition Arousal: Alert Behavior During Therapy: WFL for tasks assessed/performed Cognition: No apparent impairments             OT - Cognition Comments: wife and daughter were present in room                                        Pertinent Vitals/ Pain       Pain Assessment Pain Assessment: Faces Faces Pain Scale: Hurts a little bit Pain Location: R knee Pain Descriptors / Indicators: Discomfort Pain Intervention(s): Limited activity within patient's tolerance, Monitored during session         Frequency  Min 1X/week        Progress Toward Goals  OT Goals(current goals can now be found in the care plan  section)  Progress towards OT goals: Progressing toward goals     Plan         AM-PAC OT "6 Clicks" Daily Activity     Outcome Measure   Help from another person eating meals?: None Help from another person taking care of personal grooming?: A Little Help from another person toileting, which includes using toliet, bedpan, or urinal?: A Little Help from another person bathing (including washing, rinsing, drying)?: A Little Help from another person to put on and taking off regular upper body clothing?: A Little Help  from another person to put on and taking off regular lower body clothing?: A Little 6 Click Score: 19    End of Session Equipment Utilized During Treatment: Gait belt;Rolling walker (2 wheels)  OT Visit Diagnosis: Pain;Unsteadiness on feet (R26.81) Pain - Right/Left: Right Pain - part of body: Knee   Activity Tolerance Patient tolerated treatment well   Patient Left in chair;with call bell/phone within reach;with family/visitor present;with nursing/sitter in room   Nurse Communication Mobility status        Time: 1510-1539 OT Time Calculation (min): 29 min  Charges: OT General Charges $OT Visit: 1 Visit OT Treatments $Self Care/Home Management : 23-37 mins  Rosalio Loud, MS Acute Rehabilitation Department Office# [phone]   Selinda Flavin 01/31/2024, 3:45 PM

## 2024-01-31 NOTE — Progress Notes (Signed)
 Mobility Specialist - Progress Note   01/31/24 1050  Mobility  Activity Ambulated with assistance in hallway  Level of Assistance Standby assist, set-up cues, supervision of patient - no hands on  Assistive Device Front wheel walker  Distance Ambulated (ft) 120 ft  Activity Response Tolerated well  Mobility Referral Yes  Mobility visit 1 Mobility  Mobility Specialist Start Time (ACUTE ONLY) 1034  Mobility Specialist Stop Time (ACUTE ONLY) 1050  Mobility Specialist Time Calculation (min) (ACUTE ONLY) 16 min   Pt received in bed and agreeable to mobility. No complaints during session. Pt to bed after session with all needs met.    [facility]

## 2024-02-01 DIAGNOSIS — I2699 Other pulmonary embolism without acute cor pulmonale: Secondary | ICD-10-CM | POA: Diagnosis not present

## 2024-02-01 DIAGNOSIS — K9189 Other postprocedural complications and disorders of digestive system: Secondary | ICD-10-CM | POA: Diagnosis not present

## 2024-02-01 DIAGNOSIS — K567 Ileus, unspecified: Secondary | ICD-10-CM | POA: Diagnosis not present

## 2024-02-01 DIAGNOSIS — R933 Abnormal findings on diagnostic imaging of other parts of digestive tract: Secondary | ICD-10-CM | POA: Diagnosis not present

## 2024-02-01 LAB — BASIC METABOLIC PANEL
Anion gap: 9 (ref 5–15)
BUN: 13 mg/dL (ref 8–23)
CO2: 25 mmol/L (ref 22–32)
Calcium: 8.1 mg/dL — ABNORMAL LOW (ref 8.9–10.3)
Chloride: 96 mmol/L — ABNORMAL LOW (ref 98–111)
Creatinine, Ser: 0.77 mg/dL (ref 0.61–1.24)
GFR, Estimated: >60 mL/min (ref >60)
Glucose, Bld: 115 mg/dL — ABNORMAL HIGH (ref 70–99)
Potassium: 3.6 mmol/L (ref 3.5–5.1)
Sodium: 130 mmol/L — ABNORMAL LOW (ref 135–145)

## 2024-02-01 LAB — CBC
HCT: 30.1 % — ABNORMAL LOW (ref 39.0–52.0)
Hemoglobin: 9.9 g/dL — ABNORMAL LOW (ref 13.0–17.0)
MCH: 30.6 pg (ref 26.0–34.0)
MCHC: 32.9 g/dL (ref 30.0–36.0)
MCV: 92.9 fL (ref 80.0–100.0)
Platelets: 405 10*3/uL — ABNORMAL HIGH (ref 150–400)
RBC: 3.24 MIL/uL — ABNORMAL LOW (ref 4.22–5.81)
RDW: 13.4 % (ref 11.5–15.5)
WBC: 8.9 10*3/uL (ref 4.0–10.5)
nRBC: 0 % (ref 0.0–0.2)

## 2024-02-01 MED ORDER — APIXABAN (ELIQUIS) VTE STARTER PACK (10MG AND 5MG): ORAL_TABLET | ORAL | 0 refills | Status: AC

## 2024-02-01 MED ORDER — POLYETHYLENE GLYCOL 3350 17 G PO PACK: 17.0000 g | PACK | Freq: Every day | ORAL | 0 refills | Status: AC

## 2024-02-01 MED ORDER — BISACODYL 5 MG PO TBEC: 5.0000 mg | DELAYED_RELEASE_TABLET | Freq: Every day | ORAL | 0 refills | Status: AC | PRN

## 2024-02-01 NOTE — TOC Progression Note (Signed)
 Transition of Care [facility]) - Progression Note    Patient Details  Name: James Tate MRN: [id number] Date of Birth: 1946-06-18  Transition of [facility]) CM/SW Contact  Diona Browner, [place] Phone Number: 02/01/2024, 10:23 AM  Clinical Narrative:    Pt recommended for HHPT/OT. Pt accepting of recommendation. HHPT/OT set up w/ [facility]. HHPT/OT information added to AVS.        Expected Discharge Plan and Services       Living arrangements for the past 2 months: Single Family Home                                       Social Determinants of Health (SDOH) Interventions SDOH Screenings   Food Insecurity: No Food Insecurity (01/26/2024)  Housing: Low Risk  (01/26/2024)  Transportation Needs: No Transportation Needs (01/26/2024)  Utilities: Not At Risk (01/26/2024)  Social Connections: Socially Isolated (01/26/2024)  Tobacco Use: Medium Risk (01/26/2024)    Readmission Risk Interventions    01/28/2024    9:55 AM  Readmission Risk Prevention Plan  Post Dischage Appt Complete  Medication Screening Complete  Transportation Screening Complete

## 2024-02-01 NOTE — Discharge Instructions (Addendum)
 Information on my medicine - ELIQUIS (apixaban)  This medication education was reviewed with me or my healthcare representative as part of my discharge preparation.    Why was Eliquis prescribed for you? Eliquis was prescribed to treat blood clots that may have been found in the veins of your legs (deep vein thrombosis) or in your lungs (pulmonary embolism) and to reduce the risk of them occurring again.  What do You need to know about Eliquis ? The starting dose is 10 mg (two 5 mg tablets) taken TWICE daily for the FIRST FOUR (4) DAYS, then on 02/04/24 the dose is reduced to ONE 5 mg tablet taken TWICE daily.  Eliquis may be taken with or without food.   Try to take the dose about the same time in the morning and in the evening. If you have difficulty swallowing the tablet whole please discuss with your pharmacist how to take the medication safely.  Take Eliquis exactly as prescribed and DO NOT stop taking Eliquis without talking to the doctor who prescribed the medication.  Stopping may increase your risk of developing a new blood clot.  Refill your prescription before you run out.  After discharge, you should have regular check-up appointments with your healthcare provider that is prescribing your Eliquis.    What do you do if you miss a dose? If a dose of ELIQUIS is not taken at the scheduled time, take it as soon as possible on the same day and twice-daily administration should be resumed. The dose should not be doubled to make up for a missed dose.  Important Safety Information A possible side effect of Eliquis is bleeding. You should call your healthcare provider right away if you experience any of the following: Bleeding from an injury or your nose that does not stop. Unusual colored urine (red or dark brown) or unusual colored stools (red or black). Unusual bruising for unknown reasons. A serious fall or if you hit your head (even if there is no bleeding).  Some medicines  may interact with Eliquis and might increase your risk of bleeding or clotting while on Eliquis. To help avoid this, consult your healthcare provider or pharmacist prior to using any new prescription or non-prescription medications, including herbals, vitamins, non-steroidal anti-inflammatory drugs (NSAIDs) and supplements.  This website has more information on Eliquis (apixaban): http://www.eliquis.com/eliquis/home

## 2024-02-01 NOTE — Progress Notes (Signed)
 Subjective: Continues to feel better each day. Passing stool and flatus.  Objective: Vital signs in last 24 hours: Temp:  [97.6 F (36.4 C)-98.5 F (36.9 C)] 97.6 F (36.4 C) (02/26 0532) Pulse Rate:  [85-90] 85 (02/26 0532) Resp:  [16-18] 16 (02/26 0532) BP: (118-128)/(66-75) 118/66 (02/26 0532) SpO2:  [92 %-97 %] 93 % (02/26 0532) Weight change:  Last BM Date : 02/01/24  PE: GEN:  NAD ABD:  Mild/moderate abdominal distention, tympany, improving NEURO:  No encephalopathy  Lab Results: CBC    Component Value Date/Time   WBC 8.9 02/01/2024 0625   RBC 3.24 (L) 02/01/2024 0625   HGB 9.9 (L) 02/01/2024 0625   HCT 30.1 (L) 02/01/2024 0625   PLT 405 (H) 02/01/2024 0625   MCV 92.9 02/01/2024 0625   MCH 30.6 02/01/2024 0625   MCHC 32.9 02/01/2024 0625   RDW 13.4 02/01/2024 0625   LYMPHSABS 0.4 (L) 01/26/2024 0951   MONOABS 0.8 01/26/2024 0951   EOSABS 0.1 01/26/2024 0951   BASOSABS 0.0 01/26/2024 0951  CMP     Component Value Date/Time   NA 130 (L) 02/01/2024 0625   K 3.6 02/01/2024 0625   CL 96 (L) 02/01/2024 0625   CO2 25 02/01/2024 0625   GLUCOSE 115 (H) 02/01/2024 0625   BUN 13 02/01/2024 0625   CREATININE 0.77 02/01/2024 0625   CALCIUM 8.1 (L) 02/01/2024 0625   PROT 8.1 01/26/2024 0951   PROT 7.5 06/17/2021 1059   ALBUMIN 3.8 01/26/2024 0951   ALBUMIN 4.8 (H) 06/17/2021 1059   AST 31 01/26/2024 0951   ALT 20 01/26/2024 0951   ALKPHOS 61 01/26/2024 0951   BILITOT 2.2 (H) 01/26/2024 0951   BILITOT 1.5 (H) 06/17/2021 1059   EGFR 93.0 04/28/2023 1119   GFRNONAA >60 02/01/2024 0625   Assessment:  Ileus.  Post-operative (knee replacement). Abdominal distention from #1 above. Pulmonary embolism, small branch RUL.  Plan:   Soft diet for next week and then slowly advance. Miralax 17 grams po every day for the next couple weeks. Ok to discharge home from GI perspective. [facility] will sign-off; please call with questions; can follow-up with [place] on as-needed  basis.   Freddy Jaksch 02/01/2024, 11:09 AM   Cell [phone] If no answer or after 5 PM call [phone]

## 2024-02-01 NOTE — Discharge Summary (Signed)
 Physician Discharge Summary   Patient: James Tate MRN: [id number] DOB: 06-21-1946  Admit date:     01/26/2024  Discharge date: 02/01/24  Discharge Physician: [name]   PCP: Debroah Loop, DO   Recommendations at discharge:    Follow-up with PCP in 1 week Follow-up with cardiology as scheduled  Discharge Diagnoses: Principal Problem:   Acute pulmonary embolism (HCC) Active Problems:   Cryptogenic stroke (HCC)   Primary osteoarthritis of left knee   Malignant neoplasm of prostate (HCC)   Hearing loss   Hypercholesterolemia   Osteopenia   Ileus [facility])    Hospital Course: 78 year old male with past medical history of 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 He also complained of SOB without chest pain. In the ED: Vital stable saturating 93-90% on room air.  Labs otherwise stable. Underwent CTA chest and CT abdomen pelvis: Positive for PE with a single focus of segmental to subsegmental embolus in the posterior RUL. CT abd/pelvis most consistent with ileus or enteritis. Patient was placed on heparin, ivf antiemetics and admission was requested for PE and Ileus management.   Today, patient denied any new complaints, has been passing gas, with bowel movements.  Abdomen appears less distended denies any abdominal pain, nausea/vomiting, fever/chills.  SOB has since resolved.  Advised patient to follow-up with PCP as well as cardiology.  Discussed with wife and daughter at bedside.   Assessment and Plan:  Acute PE segmental to subsegmental RUL Acute DVT of right popliteal and right posterior tibial vein: VTE suspected to be 2/2 recent knee surgery on 2/17  and immobility despite being on aspirin twice daily Echocardiogram unremarkable S/p IV  heparin, switch to p.o. Eliquis Follow-up with PCP   Ileus Constipation  CT abdomen no SBO Continue soft diet as tolerated and advance slowly Continue Dulcolax prn and daily MiraLAX   Likely postop blood loss anemia Hemoglobin drifting slowly downwards Follow-up with PCP and monitor closely while on Eliquis Discontinue aspirin  Right knee arthroplasty 01/23/24 PT/OT Outpatient follow-up   Cryptogenic stroke in April 2016 History of PFO/MR Followed by cardiology Discontinued aspirin for now as patient is currently on Eliquis with now postop blood loss anemia Cardiology to decide if patient can continue aspirin once hemoglobin stabilizes  Hyponatremia Likely 2/2 poor oral intake Follow-up with PCP with repeat labs   Anxiety disorder Stable mood.Continue home bupriprion.   BPH   OSA on CPAP Continue   SNHL/Osteopenia Cont supportive care.vitamin d,b12, repatha.   Hyperparathyroidism hx       Pain control - [facility] Controlled Substance Reporting System database was reviewed. and patient was instructed, not to drive, operate heavy machinery, perform activities at heights, swimming or participation in water activities or provide baby-sitting services while on Pain, Sleep and Anxiety Medications; until their outpatient Physician has advised to do so again. Also recommended to not to take more than prescribed Pain, Sleep and Anxiety Medications.    Consultants: GI Procedures performed: None Disposition: Home Diet recommendation:  Soft diet     DISCHARGE MEDICATION: Allergies as of 02/01/2024       Reactions   Atorvastatin Other (See Comments)   Aches in legs   Codeine Other (See Comments)   Headache.    Erythromycin    Childhood allergy - unknown   Rosuvastatin Calcium  Memory problem (03/2020) Aches in   legs        Medication List     STOP taking these medications    aspirin EC 81 MG tablet       TAKE these medications    Apixaban  Starter Pack (10mg  and 5mg ) Commonly known as: ELIQUIS STARTER PACK Take as directed on package: start with two-5mg  tablets twice daily for 7 days. On day 8, switch to one-5mg  tablet twice daily.   bisacodyl 5 MG EC tablet Commonly known as: Dulcolax Take 1 tablet (5 mg total) by mouth daily as needed for moderate constipation.   buPROPion ER 100 MG 12 hr tablet Commonly known as: WELLBUTRIN SR Take 100 mg by mouth at bedtime.   CAL-MAG-ZINC-D PO Take 1 tablet by mouth daily.   cyanocobalamin 1000 MCG tablet Commonly known as: VITAMIN B12 Take 1,000 mcg by mouth every morning.   D2000 Ultra Strength 50 MCG (2000 UT) Caps Generic drug: Cholecalciferol Take 2,000 Units by mouth daily.   diphenhydramine-acetaminophen 25-500 MG Tabs tablet Commonly known as: TYLENOL PM Take 2 tablets by mouth at bedtime.   multivitamin capsule Take 1 capsule by mouth daily.   OMEGA 3 PO Take 1 capsule by mouth every morning.   oxyCODONE 5 MG immediate release tablet Commonly known as: Roxicodone Take 1 tablet (5 mg total) by mouth every 4 (four) hours as needed for severe pain (pain score 7-10).   polyethylene glycol 17 g packet Commonly known as: MIRALAX / GLYCOLAX Take 17 g by mouth daily. Start taking on: February 02, 2024   Repatha SureClick 140 MG/ML Soaj Generic drug: Evolocumab ADMINISTER 1 ML UNDER THE SKIN EVERY 14 DAYS What changed: See the new instructions.   tadalafil 20 MG tablet Commonly known as: CIALIS Take 20 mg by mouth daily as needed for erectile dysfunction.   tiZANidine 2 MG tablet Commonly known as: ZANAFLEX Take 1 tablet (2 mg total) by mouth every 6 (six) hours as needed for muscle spasms.   triamcinolone 0.1 % paste Commonly known as: KENALOG Use as directed 1 application in the mouth or throat 2 (two) times daily as needed (mouth sores).   Vitamin K (Phytonadione) 100 MCG Tabs Take 100 mcg by mouth daily.        Follow-up Information     Care,  [facility] Follow up.   Specialty: Home Health Services Why: [facility] will be providing your physical and occupational therapy needs. Contact information: [address] [place] [address] [phone]         Debroah Loop, DO. Schedule an appointment as soon as possible for a visit in 1 week(s).   Specialty: Family Medicine Contact information: [address] [place] [address] [phone]                Discharge Exam: Ceasar Mons Weights   01/26/24 1838  Weight: 85.7 kg   General: NAD  Cardiovascular: S1, S2 present Respiratory: CTAB Abdomen: Soft, nontender, nondistended, bowel sounds present Musculoskeletal: RLE swelling Skin: Normal Psychiatry: Normal mood   Condition at discharge: stable  The results of significant diagnostics from this hospitalization (including imaging, microbiology, ancillary and laboratory) are listed below for reference.   Imaging Studies: DG Abd 1 View Result Date: 01/28/2024 CLINICAL DATA:  Ileus. EXAM: ABDOMEN - 1 VIEW COMPARISON:  January 27, 2024. FINDINGS: Status post cholecystectomy. Dilated small bowel loops are again noted without colonic dilatation, concerning for distal small  bowel obstruction or possibly ileus. IMPRESSION: Small bowel dilatation is again noted concerning for distal small bowel obstruction or possibly ileus. Electronically Signed   By: Lupita Raider M.D.   On: 01/28/2024 10:22   ECHOCARDIOGRAM COMPLETE Result Date: 01/27/2024    ECHOCARDIOGRAM REPORT   Patient Name:   James Tate Date of Exam: 01/27/2024 Medical Rec #:  [id number]       Height:       72.0 in Accession #:    [id number]      Weight:       189.0 lb Date of Birth:  12-30-1945       BSA:          2.080 m Patient Age:    78 years        BP:           134/71 mmHg Patient Gender: M               HR:           78 bpm. Exam Location:  Inpatient Procedure: 2D Echo, Cardiac Doppler and Color Doppler (Both  Spectral and Color            Flow Doppler were utilized during procedure). Indications:    Pulmonary Embolus I26.09  History:        Patient has prior history of Echocardiogram examinations, most                 recent 08/02/2019. Stroke.  Sonographer:    Webb Laws Referring Phys: [id number] RAMESH KC IMPRESSIONS  1. Left ventricular ejection fraction, by estimation, is 60 to 65%. The left ventricle has normal function. The left ventricle has no regional wall motion abnormalities. There is mild concentric left ventricular hypertrophy. Left ventricular diastolic parameters are consistent with Grade II diastolic dysfunction (pseudonormalization).  2. Right ventricular systolic function is normal. The right ventricular size is normal. Tricuspid regurgitation signal is inadequate for assessing PA pressure.  3. The mitral valve is abnormal. Mild to moderate eccentric mitral valve regurgitation. No evidence of mitral stenosis.  4. The aortic valve is tricuspid. There is mild calcification of the aortic valve. Aortic valve regurgitation is not visualized. No aortic stenosis is present.  5. IVC not visualized. FINDINGS  Left Ventricle: Left ventricular ejection fraction, by estimation, is 60 to 65%. The left ventricle has normal function. The left ventricle has no regional wall motion abnormalities. Strain imaging was not performed. The left ventricular internal cavity  size was normal in size. There is mild concentric left ventricular hypertrophy. Left ventricular diastolic parameters are consistent with Grade II diastolic dysfunction (pseudonormalization). Right Ventricle: The right ventricular size is normal. No increase in right ventricular wall thickness. Right ventricular systolic function is normal. Tricuspid regurgitation signal is inadequate for assessing PA pressure. Left Atrium: Left atrial size was normal in size. Right Atrium: Right atrial size was normal in size. Pericardium: There is no evidence of  pericardial effusion. Mitral Valve: The mitral valve is abnormal. Mild to moderate mitral valve regurgitation. No evidence of mitral valve stenosis. Tricuspid Valve: The tricuspid valve is normal in structure. Tricuspid valve regurgitation is not demonstrated. Aortic Valve: The aortic valve is tricuspid. There is mild calcification of the aortic valve. Aortic valve regurgitation is not visualized. No aortic stenosis is present. Pulmonic Valve: The pulmonic valve was normal in structure. Pulmonic valve regurgitation is not visualized. Aorta: The aortic root is normal in size and structure. Venous: IVC not visualized.  The inferior vena cava was not well visualized. IAS/Shunts: No atrial level shunt detected by color flow Doppler. Additional Comments: 3D imaging was not performed.  LEFT VENTRICLE PLAX 2D LVIDd:         4.90 cm   Diastology LVIDs:         2.80 cm   LV e' medial:    6.09 cm/s LV PW:         1.20 cm   LV E/e' medial:  12.8 LV IVS:        1.20 cm   LV e' lateral:   10.80 cm/s LVOT diam:     2.00 cm   LV E/e' lateral: 7.2 LV SV:         68 LV SV Index:   32 LVOT Area:     3.14 cm  RIGHT VENTRICLE RV Basal diam:  3.70 cm RV S prime:     17.20 cm/s TAPSE (M-mode): 2.5 cm LEFT ATRIUM             Index        RIGHT ATRIUM          Index LA diam:        3.80 cm 1.83 cm/m   RA Area:     9.20 cm LA Vol (A2C):   41.6 ml 20.00 ml/m  RA Volume:   15.50 ml 7.45 ml/m LA Vol (A4C):   32.1 ml 15.43 ml/m LA Biplane Vol: 37.2 ml 17.89 ml/m  AORTIC VALVE LVOT Vmax:   108.00 cm/s LVOT Vmean:  77.200 cm/s LVOT VTI:    0.215 m  AORTA Ao Root diam: 3.40 cm Ao Asc diam:  3.30 cm MITRAL VALVE MV Area (PHT): 3.85 cm    SHUNTS MV Decel Time: 197 msec    Systemic VTI:  0.22 m MV E velocity: 78.00 cm/s  Systemic Diam: 2.00 cm MV A velocity: 75.80 cm/s MV E/A ratio:  1.03 Dalton McleanMD Electronically signed by Wilfred Lacy Signature Date/Time: 01/27/2024/3:19:22 PM    Final    DG Abd 1 View Result Date:  01/27/2024 CLINICAL DATA:  Follow-up ileus. EXAM: ABDOMEN - 1 VIEW COMPARISON:  CT, 01/26/2024 FINDINGS: Multiple loops of dilated small bowel are consistent with a partial obstruction versus small bowel adynamic ileus, without change from the previous day's CT scan. IMPRESSION: No change from the previous day's CT scan. Multiple dilated small bowel loops consistent with a partial obstruction versus adynamic ileus. Electronically Signed   By: Amie Portland M.D.   On: 01/27/2024 09:02   VAS [place] LOWER EXTREMITY VENOUS (DVT) Result Date: 01/26/2024  Lower Venous DVT Study Patient Name:  James Tate  Date of Exam:   01/26/2024 Medical Rec #: [id number]        Accession #:    [id number] Date of Birth: 09-Oct-1946        Patient Gender: M Patient Age:   27 years Exam Location:  [facility] Procedure:      VAS [place] LOWER EXTREMITY VENOUS (DVT) Referring Phys: RAMESH KC --------------------------------------------------------------------------------  Indications: Pulmonary embolism.  Risk Factors: Surgery RLE TKA on 01/23/2024. Comparison Study: No previous exams Performing Technologist: Jody Hill RVT, RDMS  Examination Guidelines: A complete evaluation includes B-mode imaging, spectral Doppler, color Doppler, and power Doppler as needed of all accessible portions of each vessel. Bilateral testing is considered an integral part of a complete examination. Limited examinations for reoccurring indications may be performed as noted. The reflux portion of the exam  is performed with the patient in reverse Trendelenburg.  +---------+---------------+---------+-----------+----------+--------------+ RIGHT    CompressibilityPhasicitySpontaneityPropertiesThrombus Aging +---------+---------------+---------+-----------+----------+--------------+ CFV      Full           Yes      Yes                                 +---------+---------------+---------+-----------+----------+--------------+ SFJ      Full                                                         +---------+---------------+---------+-----------+----------+--------------+ FV Prox  Full           Yes      Yes                                 +---------+---------------+---------+-----------+----------+--------------+ FV Mid   Full           Yes      Yes                                 +---------+---------------+---------+-----------+----------+--------------+ FV DistalFull           Yes      Yes                                 +---------+---------------+---------+-----------+----------+--------------+ PFV      Full                                                        +---------+---------------+---------+-----------+----------+--------------+ POP      None           No       No                   Acute          +---------+---------------+---------+-----------+----------+--------------+ PTV      None           No       No                   Acute          +---------+---------------+---------+-----------+----------+--------------+ PERO     Full                                                        +---------+---------------+---------+-----------+----------+--------------+   +---------+---------------+---------+-----------+-------------+--------------+ LEFT     CompressibilityPhasicitySpontaneityProperties   Thrombus Aging +---------+---------------+---------+-----------+-------------+--------------+ CFV      Full           Yes      Yes                                    +---------+---------------+---------+-----------+-------------+--------------+  SFJ      Full                                                           +---------+---------------+---------+-----------+-------------+--------------+ FV Prox  Full           Yes      Yes                                    +---------+---------------+---------+-----------+-------------+--------------+ FV Mid   Full           Yes      Yes                                     +---------+---------------+---------+-----------+-------------+--------------+ FV DistalFull           Yes      Yes                                    +---------+---------------+---------+-----------+-------------+--------------+ PFV      Full                                                           +---------+---------------+---------+-----------+-------------+--------------+ POP      Full           Yes      Yes        rouleaux flow               +---------+---------------+---------+-----------+-------------+--------------+ PTV      Full                                                           +---------+---------------+---------+-----------+-------------+--------------+ PERO     Full                                                           +---------+---------------+---------+-----------+-------------+--------------+     Summary: BILATERAL: -No evidence of popliteal cyst, bilaterally. RIGHT: - Findings consistent with acute deep vein thrombosis involving the right popliteal vein, and right posterior tibial veins.   LEFT: - There is no evidence of deep vein thrombosis in the lower extremity.  *See table(s) above for measurements and observations. Electronically signed by Heath Lark on 01/26/2024 at 5:03:07 PM.    Final    CT Angio Chest PE W and/or Wo Contrast Addendum Date: 01/26/2024 ADDENDUM REPORT: 01/26/2024 11:59 ADDENDUM: Findings discussed by telephone with Dr. Maple Hudson 11:55 a.m., 01/26/2024. By report, patient underwent right knee arthroplasty 2 days ago. Electronically Signed   By: Bonna Gains.D.  On: 01/26/2024 11:59   Result Date: 01/26/2024 CLINICAL DATA:  PE suspected, nausea, vomiting, constipation, shortness of breath, history of prostate cancer * Tracking Code: BO * EXAM: CT ANGIOGRAPHY CHEST CT ABDOMEN AND PELVIS WITH CONTRAST TECHNIQUE: Multidetector CT imaging of the chest was performed using the standard protocol  during bolus administration of intravenous contrast. Multiplanar CT image reconstructions and MIPs were obtained to evaluate the vascular anatomy. Multidetector CT imaging of the abdomen and pelvis was performed using the standard protocol during bolus administration of intravenous contrast. RADIATION DOSE REDUCTION: This exam was performed according to the departmental dose-optimization program which includes automated exposure control, adjustment of the mA and/or kV according to patient size and/or use of iterative reconstruction technique. CONTRAST:  OMNIPAQUE IOHEXOL 350 MG/ML SOLN COMPARISON:  PET-CT, 03/18/2022 FINDINGS: CT CHEST ANGIOGRAM FINDINGS Cardiovascular: Satisfactory opacification of the pulmonary arteries to the segmental level. Positive examination for pulmonary embolism with a single focus of segmental to subsegmental embolus in the posterior right upper lobe (series 4, image 57). Cardiomegaly. Three-vessel coronary artery calcifications. No pericardial effusion. Mediastinum/Nodes: No enlarged mediastinal, hilar, or axillary lymph nodes. Thyroid gland, trachea, and esophagus demonstrate no significant findings. Lungs/Pleura: Bibasilar scarring and volume loss, with elevation of the right hemidiaphragm. No pleural effusion or pneumothorax. Musculoskeletal: No chest wall abnormality. No acute osseous findings. Review of the MIP images confirms the above findings. CT ABDOMEN PELVIS FINDINGS Hepatobiliary: No focal liver abnormality is seen. Status post cholecystectomy. No biliary dilatation. Pancreas: Unremarkable. No pancreatic ductal dilatation or surrounding inflammatory changes. Spleen: Normal in size without significant abnormality. Adrenals/Urinary Tract: Adrenal glands are unremarkable. Kidneys are normal, without renal calculi, solid lesion, or hydronephrosis. Bladder is unremarkable. Stomach/Bowel: Stomach is within normal limits. Appendix appears normal. The mid small bowel is air and  fluid-filled, although not overtly distended, loops measuring up to 3.7 cm in caliber without abrupt transition point, and scattered gas and stool present throughout the colon to the rectum. Vascular/Lymphatic: Aortic atherosclerosis. No enlarged abdominal or pelvic lymph nodes. Reproductive: Prostatomegaly with biopsy marking clips or fiducials. Other: No abdominal wall hernia or abnormality. No ascites. Musculoskeletal: No acute osseous findings. Soft tissue stranding and gas loculations noted in the partially imaged proximal right vastus musculature (series 2, image 103). IMPRESSION: 1. Positive examination for pulmonary embolism with a single focus of segmental to subsegmental embolus in the posterior right upper lobe. No evidence of right heart strain. 2. The mid small bowel is air and fluid-filled, although not overtly distended, loops measuring up to 3.7 cm in caliber without abrupt transition point, and scattered gas and stool present throughout the colon to the rectum. Findings are most consistent with ileus or enteritis. 3. Soft tissue stranding and gas loculations noted in the partially imaged proximal right vastus musculature. This is of uncertain significance, however concerning for gas-forming infection in the absence of recent surgical procedure or other explanation. Consider dedicated imaging of the right lower extremity. 4. Prostatomegaly with biopsy marking clips or fiducials. No evidence of lymphadenopathy or metastatic disease in the abdomen or pelvis. 5. Cardiomegaly and coronary artery disease. Call report request was placed at the time of interpretation. Report issued at this time in the interest of expediency. Final communication of critical findings will be documented. Aortic Atherosclerosis (ICD10-I70.0). Electronically Signed: By: Jearld Lesch M.D. On: 01/26/2024 11:49   CT ABDOMEN PELVIS W CONTRAST Addendum Date: 01/26/2024 ADDENDUM REPORT: 01/26/2024 11:59 ADDENDUM: Findings  discussed by telephone with Dr. Maple Hudson 11:55 a.m., 01/26/2024. By report,  patient underwent right knee arthroplasty 2 days ago. Electronically Signed   By: Jearld Lesch M.D.   On: 01/26/2024 11:59   Result Date: 01/26/2024 CLINICAL DATA:  PE suspected, nausea, vomiting, constipation, shortness of breath, history of prostate cancer * Tracking Code: BO * EXAM: CT ANGIOGRAPHY CHEST CT ABDOMEN AND PELVIS WITH CONTRAST TECHNIQUE: Multidetector CT imaging of the chest was performed using the standard protocol during bolus administration of intravenous contrast. Multiplanar CT image reconstructions and MIPs were obtained to evaluate the vascular anatomy. Multidetector CT imaging of the abdomen and pelvis was performed using the standard protocol during bolus administration of intravenous contrast. RADIATION DOSE REDUCTION: This exam was performed according to the departmental dose-optimization program which includes automated exposure control, adjustment of the mA and/or kV according to patient size and/or use of iterative reconstruction technique. CONTRAST:  OMNIPAQUE IOHEXOL 350 MG/ML SOLN COMPARISON:  PET-CT, 03/18/2022 FINDINGS: CT CHEST ANGIOGRAM FINDINGS Cardiovascular: Satisfactory opacification of the pulmonary arteries to the segmental level. Positive examination for pulmonary embolism with a single focus of segmental to subsegmental embolus in the posterior right upper lobe (series 4, image 57). Cardiomegaly. Three-vessel coronary artery calcifications. No pericardial effusion. Mediastinum/Nodes: No enlarged mediastinal, hilar, or axillary lymph nodes. Thyroid gland, trachea, and esophagus demonstrate no significant findings. Lungs/Pleura: Bibasilar scarring and volume loss, with elevation of the right hemidiaphragm. No pleural effusion or pneumothorax. Musculoskeletal: No chest wall abnormality. No acute osseous findings. Review of the MIP images confirms the above findings. CT ABDOMEN PELVIS FINDINGS  Hepatobiliary: No focal liver abnormality is seen. Status post cholecystectomy. No biliary dilatation. Pancreas: Unremarkable. No pancreatic ductal dilatation or surrounding inflammatory changes. Spleen: Normal in size without significant abnormality. Adrenals/Urinary Tract: Adrenal glands are unremarkable. Kidneys are normal, without renal calculi, solid lesion, or hydronephrosis. Bladder is unremarkable. Stomach/Bowel: Stomach is within normal limits. Appendix appears normal. The mid small bowel is air and fluid-filled, although not overtly distended, loops measuring up to 3.7 cm in caliber without abrupt transition point, and scattered gas and stool present throughout the colon to the rectum. Vascular/Lymphatic: Aortic atherosclerosis. No enlarged abdominal or pelvic lymph nodes. Reproductive: Prostatomegaly with biopsy marking clips or fiducials. Other: No abdominal wall hernia or abnormality. No ascites. Musculoskeletal: No acute osseous findings. Soft tissue stranding and gas loculations noted in the partially imaged proximal right vastus musculature (series 2, image 103). IMPRESSION: 1. Positive examination for pulmonary embolism with a single focus of segmental to subsegmental embolus in the posterior right upper lobe. No evidence of right heart strain. 2. The mid small bowel is air and fluid-filled, although not overtly distended, loops measuring up to 3.7 cm in caliber without abrupt transition point, and scattered gas and stool present throughout the colon to the rectum. Findings are most consistent with ileus or enteritis. 3. Soft tissue stranding and gas loculations noted in the partially imaged proximal right vastus musculature. This is of uncertain significance, however concerning for gas-forming infection in the absence of recent surgical procedure or other explanation. Consider dedicated imaging of the right lower extremity. 4. Prostatomegaly with biopsy marking clips or fiducials. No evidence of  lymphadenopathy or metastatic disease in the abdomen or pelvis. 5. Cardiomegaly and coronary artery disease. Call report request was placed at the time of interpretation. Report issued at this time in the interest of expediency. Final communication of critical findings will be documented. Aortic Atherosclerosis (ICD10-I70.0). Electronically Signed: By: Jearld Lesch M.D. On: 01/26/2024 11:49    Microbiology: Results for orders placed or  performed during the hospital encounter of 01/26/24  Resp panel by RT-PCR (RSV, Flu A&B, Covid) Anterior Nasal Swab     Status: None   Collection Time: 01/26/24  9:38 AM   Specimen: Anterior Nasal Swab  Result Value Ref Range Status   SARS Coronavirus 2 by RT PCR NEGATIVE NEGATIVE Final    Comment: (NOTE) SARS-CoV-2 target nucleic acids are NOT DETECTED.  The SARS-CoV-2 RNA is generally detectable in upper respiratory specimens during the acute phase of infection. The lowest concentration of SARS-CoV-2 viral copies this assay can detect is 138 copies/mL. A negative result does not preclude SARS-Cov-2 infection and should not be used as the sole basis for treatment or other patient management decisions. A negative result may occur with  improper specimen collection/handling, submission of specimen other than nasopharyngeal swab, presence of viral mutation(s) within the areas targeted by this assay, and inadequate number of viral copies(<138 copies/mL). A negative result must be combined with clinical observations, patient history, and epidemiological information. The expected result is Negative.  Fact Sheet for Patients:  BloggerCourse.com  Fact Sheet for Healthcare Providers:  SeriousBroker.it  This test is no t yet approved or cleared by the [place] FDA and  has been authorized for detection and/or diagnosis of SARS-CoV-2 by FDA under an Emergency Use Authorization (EUA). This EUA will remain   in effect (meaning this test can be used) for the duration of the COVID-19 declaration under Section 564(b)(1) of the Act, 21 U.S.C.section 360bbb-3(b)(1), unless the authorization is terminated  or revoked sooner.       Influenza A by PCR NEGATIVE NEGATIVE Final   Influenza B by PCR NEGATIVE NEGATIVE Final    Comment: (NOTE) The Xpert Xpress SARS-CoV-2/FLU/RSV plus assay is intended as an aid in the diagnosis of influenza from Nasopharyngeal swab specimens and should not be used as a sole basis for treatment. Nasal washings and aspirates are unacceptable for Xpert Xpress SARS-CoV-2/FLU/RSV testing.  Fact Sheet for Patients: BloggerCourse.com  Fact Sheet for Healthcare Providers: SeriousBroker.it  This test is not yet approved or cleared by the [place] FDA and has been authorized for detection and/or diagnosis of SARS-CoV-2 by FDA under an Emergency Use Authorization (EUA). This EUA will remain in effect (meaning this test can be used) for the duration of the COVID-19 declaration under Section 564(b)(1) of the Act, 21 U.S.C. section 360bbb-3(b)(1), unless the authorization is terminated or revoked.     Resp Syncytial Virus by PCR NEGATIVE NEGATIVE Final    Comment: (NOTE) Fact Sheet for Patients: BloggerCourse.com  Fact Sheet for Healthcare Providers: SeriousBroker.it  This test is not yet approved or cleared by the [place] FDA and has been authorized for detection and/or diagnosis of SARS-CoV-2 by FDA under an Emergency Use Authorization (EUA). This EUA will remain in effect (meaning this test can be used) for the duration of the COVID-19 declaration under Section 564(b)(1) of the Act, 21 U.S.C. section 360bbb-3(b)(1), unless the authorization is terminated or revoked.  Performed at [facility], [address], [place], [place]  [address]     Labs: CBC: Recent Labs  Lab 01/26/24 [id number] 01/27/24 0602 01/28/24 0127 01/29/24 0646 01/30/24 0536 01/31/24 0542 02/01/24 0625  WBC 12.4*   < > 6.8 6.2 6.9 7.2 8.9  NEUTROABS 11.0*  --   --   --   --   --   --   HGB 12.7*   < > 10.5* 9.4* 10.0* 9.3* 9.9*  HCT 37.0*   < >  31.1* 29.4* 30.9* 28.6* 30.1*  MCV 89.6   < > 90.1 93.9 92.2 94.7 92.9  PLT 318   < > 350 329 392 377 405*   < > = values in this interval not displayed.   Basic Metabolic Panel: Recent Labs  Lab 01/28/24 0127 01/29/24 0646 01/30/24 0536 01/31/24 0542 02/01/24 0625  NA 136 134* 135 136 130*  K 3.8 3.4* 3.7 4.1 3.6  CL 99 99 96* 101 96*  CO2 27 26 29 24 25   GLUCOSE 119* 118* 114* 98 115*  BUN 16 17 16 13 13   CREATININE 0.64 0.63 0.76 0.63 0.77  CALCIUM 8.5* 8.3* 8.8* 8.5* 8.1*   Liver Function Tests: Recent Labs  Lab 01/26/24 0951  AST 31  ALT 20  ALKPHOS 61  BILITOT 2.2*  PROT 8.1  ALBUMIN 3.8   CBG: No results for input(s): "GLUCAP" in the last 168 hours.  Discharge time spent: greater than 30 minutes.  Signed: [name], MD [facility] 02/01/2024

## 2024-02-01 NOTE — TOC CM/SW Note (Signed)

## 2024-02-01 NOTE — Plan of Care (Addendum)
 Patient is alert and oriented X4, all discharge information went over with patient and wife at the bedside. PIV's removed, wife to transport patient home.  Problem: Education: Goal: Knowledge of General Education information will improve Description: Including pain rating scale, medication(s)/side effects and non-pharmacologic comfort measures Outcome: Adequate for Discharge   Problem: Health Behavior/Discharge Planning: Goal: Ability to manage health-related needs will improve Outcome: Adequate for Discharge   Problem: Clinical Measurements: Goal: Ability to maintain clinical measurements within normal limits will improve Outcome: Adequate for Discharge Goal: Will remain free from infection Outcome: Adequate for Discharge Goal: Diagnostic test results will improve Outcome: Adequate for Discharge Goal: Respiratory complications will improve Outcome: Adequate for Discharge Goal: Cardiovascular complication will be avoided Outcome: Adequate for Discharge   Problem: Activity: Goal: Risk for activity intolerance will decrease Outcome: Adequate for Discharge   Problem: Nutrition: Goal: Adequate nutrition will be maintained Outcome: Adequate for Discharge   Problem: Elimination: Goal: Will not experience complications related to bowel motility Outcome: Adequate for Discharge   Problem: Pain Managment: Goal: General experience of comfort will improve and/or be controlled Outcome: Adequate for Discharge   Problem: Safety: Goal: Ability to remain free from injury will improve Outcome: Adequate for Discharge   Problem: Skin Integrity: Goal: Risk for impaired skin integrity will decrease Outcome: Adequate for Discharge

## 2024-02-03 DIAGNOSIS — Z87891 Personal history of nicotine dependence: Secondary | ICD-10-CM | POA: Diagnosis not present

## 2024-02-03 DIAGNOSIS — K5900 Constipation, unspecified: Secondary | ICD-10-CM | POA: Diagnosis not present

## 2024-02-03 DIAGNOSIS — E210 Primary hyperparathyroidism: Secondary | ICD-10-CM | POA: Diagnosis not present

## 2024-02-03 DIAGNOSIS — I82431 Acute embolism and thrombosis of right popliteal vein: Secondary | ICD-10-CM | POA: Diagnosis not present

## 2024-02-03 DIAGNOSIS — Q211 Atrial septal defect, unspecified: Secondary | ICD-10-CM | POA: Diagnosis not present

## 2024-02-03 DIAGNOSIS — Z471 Aftercare following joint replacement surgery: Secondary | ICD-10-CM | POA: Diagnosis not present

## 2024-02-03 DIAGNOSIS — R413 Other amnesia: Secondary | ICD-10-CM | POA: Diagnosis not present

## 2024-02-03 DIAGNOSIS — F419 Anxiety disorder, unspecified: Secondary | ICD-10-CM | POA: Diagnosis not present

## 2024-02-03 DIAGNOSIS — E871 Hypo-osmolality and hyponatremia: Secondary | ICD-10-CM | POA: Diagnosis not present

## 2024-02-03 DIAGNOSIS — C61 Malignant neoplasm of prostate: Secondary | ICD-10-CM | POA: Diagnosis not present

## 2024-02-03 DIAGNOSIS — K567 Ileus, unspecified: Secondary | ICD-10-CM | POA: Diagnosis not present

## 2024-02-03 DIAGNOSIS — Z96651 Presence of right artificial knee joint: Secondary | ICD-10-CM | POA: Diagnosis not present

## 2024-02-03 DIAGNOSIS — Z7901 Long term (current) use of anticoagulants: Secondary | ICD-10-CM | POA: Diagnosis not present

## 2024-02-03 DIAGNOSIS — N400 Enlarged prostate without lower urinary tract symptoms: Secondary | ICD-10-CM | POA: Diagnosis not present

## 2024-02-03 DIAGNOSIS — Z556 Problems related to health literacy: Secondary | ICD-10-CM | POA: Diagnosis not present

## 2024-02-03 DIAGNOSIS — I2693 Single subsegmental pulmonary embolism without acute cor pulmonale: Secondary | ICD-10-CM | POA: Diagnosis not present

## 2024-02-03 DIAGNOSIS — M8580 Other specified disorders of bone density and structure, unspecified site: Secondary | ICD-10-CM | POA: Diagnosis not present

## 2024-02-03 DIAGNOSIS — G4733 Obstructive sleep apnea (adult) (pediatric): Secondary | ICD-10-CM | POA: Diagnosis not present

## 2024-02-06 DIAGNOSIS — Z96651 Presence of right artificial knee joint: Secondary | ICD-10-CM | POA: Diagnosis not present

## 2024-02-06 DIAGNOSIS — Q211 Atrial septal defect, unspecified: Secondary | ICD-10-CM | POA: Diagnosis not present

## 2024-02-06 DIAGNOSIS — Z471 Aftercare following joint replacement surgery: Secondary | ICD-10-CM | POA: Diagnosis not present

## 2024-02-06 DIAGNOSIS — R413 Other amnesia: Secondary | ICD-10-CM | POA: Diagnosis not present

## 2024-02-06 DIAGNOSIS — I82431 Acute embolism and thrombosis of right popliteal vein: Secondary | ICD-10-CM | POA: Diagnosis not present

## 2024-02-06 DIAGNOSIS — I2693 Single subsegmental pulmonary embolism without acute cor pulmonale: Secondary | ICD-10-CM | POA: Diagnosis not present

## 2024-02-07 DIAGNOSIS — M1711 Unilateral primary osteoarthritis, right knee: Secondary | ICD-10-CM | POA: Diagnosis not present

## 2024-02-08 DIAGNOSIS — Z471 Aftercare following joint replacement surgery: Secondary | ICD-10-CM | POA: Diagnosis not present

## 2024-02-08 DIAGNOSIS — Q211 Atrial septal defect, unspecified: Secondary | ICD-10-CM | POA: Diagnosis not present

## 2024-02-08 DIAGNOSIS — R413 Other amnesia: Secondary | ICD-10-CM | POA: Diagnosis not present

## 2024-02-08 DIAGNOSIS — I2699 Other pulmonary embolism without acute cor pulmonale: Secondary | ICD-10-CM | POA: Diagnosis not present

## 2024-02-08 DIAGNOSIS — I82431 Acute embolism and thrombosis of right popliteal vein: Secondary | ICD-10-CM | POA: Diagnosis not present

## 2024-02-08 DIAGNOSIS — Z09 Encounter for follow-up examination after completed treatment for conditions other than malignant neoplasm: Secondary | ICD-10-CM | POA: Diagnosis not present

## 2024-02-08 DIAGNOSIS — K567 Ileus, unspecified: Secondary | ICD-10-CM | POA: Diagnosis not present

## 2024-02-08 DIAGNOSIS — Z96651 Presence of right artificial knee joint: Secondary | ICD-10-CM | POA: Diagnosis not present

## 2024-02-08 DIAGNOSIS — I2693 Single subsegmental pulmonary embolism without acute cor pulmonale: Secondary | ICD-10-CM | POA: Diagnosis not present

## 2024-02-10 DIAGNOSIS — I82431 Acute embolism and thrombosis of right popliteal vein: Secondary | ICD-10-CM | POA: Diagnosis not present

## 2024-02-10 DIAGNOSIS — Z96651 Presence of right artificial knee joint: Secondary | ICD-10-CM | POA: Diagnosis not present

## 2024-02-10 DIAGNOSIS — Z471 Aftercare following joint replacement surgery: Secondary | ICD-10-CM | POA: Diagnosis not present

## 2024-02-10 DIAGNOSIS — I2693 Single subsegmental pulmonary embolism without acute cor pulmonale: Secondary | ICD-10-CM | POA: Diagnosis not present

## 2024-02-10 DIAGNOSIS — R413 Other amnesia: Secondary | ICD-10-CM | POA: Diagnosis not present

## 2024-02-10 DIAGNOSIS — Q211 Atrial septal defect, unspecified: Secondary | ICD-10-CM | POA: Diagnosis not present

## 2024-02-13 DIAGNOSIS — R413 Other amnesia: Secondary | ICD-10-CM | POA: Diagnosis not present

## 2024-02-13 DIAGNOSIS — I82431 Acute embolism and thrombosis of right popliteal vein: Secondary | ICD-10-CM | POA: Diagnosis not present

## 2024-02-13 DIAGNOSIS — Z96651 Presence of right artificial knee joint: Secondary | ICD-10-CM | POA: Diagnosis not present

## 2024-02-13 DIAGNOSIS — I2693 Single subsegmental pulmonary embolism without acute cor pulmonale: Secondary | ICD-10-CM | POA: Diagnosis not present

## 2024-02-13 DIAGNOSIS — Z471 Aftercare following joint replacement surgery: Secondary | ICD-10-CM | POA: Diagnosis not present

## 2024-02-13 DIAGNOSIS — Q211 Atrial septal defect, unspecified: Secondary | ICD-10-CM | POA: Diagnosis not present

## 2024-02-14 DIAGNOSIS — M25661 Stiffness of right knee, not elsewhere classified: Secondary | ICD-10-CM | POA: Diagnosis not present

## 2024-02-14 DIAGNOSIS — Z96651 Presence of right artificial knee joint: Secondary | ICD-10-CM | POA: Diagnosis not present

## 2024-02-21 DIAGNOSIS — M25661 Stiffness of right knee, not elsewhere classified: Secondary | ICD-10-CM | POA: Diagnosis not present

## 2024-02-21 DIAGNOSIS — Z96651 Presence of right artificial knee joint: Secondary | ICD-10-CM | POA: Diagnosis not present

## 2024-02-23 DIAGNOSIS — Z96651 Presence of right artificial knee joint: Secondary | ICD-10-CM | POA: Diagnosis not present

## 2024-02-23 DIAGNOSIS — M25661 Stiffness of right knee, not elsewhere classified: Secondary | ICD-10-CM | POA: Diagnosis not present

## 2024-02-27 DIAGNOSIS — M25661 Stiffness of right knee, not elsewhere classified: Secondary | ICD-10-CM | POA: Diagnosis not present

## 2024-02-27 DIAGNOSIS — Z96651 Presence of right artificial knee joint: Secondary | ICD-10-CM | POA: Diagnosis not present

## 2024-03-02 DIAGNOSIS — Z96651 Presence of right artificial knee joint: Secondary | ICD-10-CM | POA: Diagnosis not present

## 2024-03-02 DIAGNOSIS — M25661 Stiffness of right knee, not elsewhere classified: Secondary | ICD-10-CM | POA: Diagnosis not present

## 2024-03-05 DIAGNOSIS — M25661 Stiffness of right knee, not elsewhere classified: Secondary | ICD-10-CM | POA: Diagnosis not present

## 2024-03-05 DIAGNOSIS — Z96651 Presence of right artificial knee joint: Secondary | ICD-10-CM | POA: Diagnosis not present

## 2024-03-08 DIAGNOSIS — M25661 Stiffness of right knee, not elsewhere classified: Secondary | ICD-10-CM | POA: Diagnosis not present

## 2024-03-08 DIAGNOSIS — Z96651 Presence of right artificial knee joint: Secondary | ICD-10-CM | POA: Diagnosis not present

## 2024-03-12 DIAGNOSIS — Z96651 Presence of right artificial knee joint: Secondary | ICD-10-CM | POA: Diagnosis not present

## 2024-03-12 DIAGNOSIS — M25661 Stiffness of right knee, not elsewhere classified: Secondary | ICD-10-CM | POA: Diagnosis not present

## 2024-03-13 ENCOUNTER — Ambulatory Visit (INDEPENDENT_AMBULATORY_CARE_PROVIDER_SITE_OTHER): Payer: Medicare Other

## 2024-03-13 VITALS — BP 132/74 | HR 81 | Temp 98.4°F | Resp 18 | Ht 72.0 in | Wt 188.0 lb

## 2024-03-13 DIAGNOSIS — Z8673 Personal history of transient ischemic attack (TIA), 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 residual deficits: Secondary | ICD-10-CM

## 2024-03-13 DIAGNOSIS — E7800 Pure hypercholesterolemia, unspecified: Secondary | ICD-10-CM

## 2024-03-13 MED ORDER — INCLISIRAN SODIUM 284 MG/1.5ML ~~LOC~~ SOSY
284.0000 mg | PREFILLED_SYRINGE | Freq: Once | SUBCUTANEOUS | Status: AC
  Administered 2024-03-13: 284 mg via SUBCUTANEOUS
  Filled 2024-03-13: qty 1.5

## 2024-03-13 NOTE — Progress Notes (Signed)
 Diagnosis: Hyperlipidemia  Provider:  Chilton Greathouse MD  Procedure: Injection  Leqvio (inclisiran), Dose: 284 mg, Site: subcutaneous, Number of injections: 1  Injection Site(s): Left arm  Post Care: Observation period completed  Discharge: Condition: Stable, Destination: Home . AVS Provided  Performed by:  Wyvonne Lenz, RN

## 2024-03-14 DIAGNOSIS — Z96651 Presence of right artificial knee joint: Secondary | ICD-10-CM | POA: Diagnosis not present

## 2024-03-14 DIAGNOSIS — M25661 Stiffness of right knee, not elsewhere classified: Secondary | ICD-10-CM | POA: Diagnosis not present

## 2024-03-19 ENCOUNTER — Other Ambulatory Visit: Payer: Self-pay | Admitting: Pharmacist

## 2024-03-27 DIAGNOSIS — M25661 Stiffness of right knee, not elsewhere classified: Secondary | ICD-10-CM | POA: Diagnosis not present

## 2024-03-27 DIAGNOSIS — Z96651 Presence of right artificial knee joint: Secondary | ICD-10-CM | POA: Diagnosis not present

## 2024-03-29 DIAGNOSIS — M25661 Stiffness of right knee, not elsewhere classified: Secondary | ICD-10-CM | POA: Diagnosis not present

## 2024-03-30 ENCOUNTER — Ambulatory Visit: Payer: Medicare Other | Admitting: Neurology

## 2024-04-02 DIAGNOSIS — Z96651 Presence of right artificial knee joint: Secondary | ICD-10-CM | POA: Diagnosis not present

## 2024-04-02 DIAGNOSIS — M25661 Stiffness of right knee, not elsewhere classified: Secondary | ICD-10-CM | POA: Diagnosis not present

## 2024-04-04 DIAGNOSIS — M25661 Stiffness of right knee, not elsewhere classified: Secondary | ICD-10-CM | POA: Diagnosis not present

## 2024-04-04 DIAGNOSIS — Z96651 Presence of right artificial knee joint: Secondary | ICD-10-CM | POA: Diagnosis not present

## 2024-04-06 ENCOUNTER — Ambulatory Visit (INDEPENDENT_AMBULATORY_CARE_PROVIDER_SITE_OTHER): Payer: Medicare Other | Admitting: Neurology

## 2024-04-06 ENCOUNTER — Encounter: Payer: Self-pay | Admitting: Neurology

## 2024-04-06 VITALS — BP 121/72 | HR 85 | Ht 72.0 in | Wt 186.0 lb

## 2024-04-06 DIAGNOSIS — R413 Other amnesia: Secondary | ICD-10-CM | POA: Diagnosis not present

## 2024-04-06 NOTE — Progress Notes (Signed)
 NEUROLOGY FOLLOW UP OFFICE NOTE  James Tate [id number] 23-May-1946  HISTORY OF PRESENT ILLNESS: I had the pleasure of seeing James Tate in follow-up in the neurology clinic on 04/06/2024.  The patient was last seen 7 months ago for memory loss. He is again accompanied by his wife who helps supplement the history today.  Records and images were personally reviewed where available.  MoCA score 28/30 in 08/2023. He was referred for Neurocognitive testing, he is scheduled to have it done next week. MRI brain in 08/2021 no acute changes, there was mild diffuse atrophy, mild chronic microvascular disease, small chronic infarcts within the bilateral cerebellar hemispheres. There was note of a 6mm round T2 hyperintense focus within the right parotid gland which has been evaluated by ENT.   He feels his memory is okay, "I know I forget things." His wife states memory has not been great. He had knee surgery 10 weeks ago with some complications, memory was really fuzzy then, this is better. He is not taking pain medication currently. He went back to driving 3 weeks ago, they deny getting lost but he would forget which place they were going to. He says it is muscle memory. He denies missing medications and bills. Mood is good, his wife notes he is very even-keel, sometimes almost depressed but has always been like that. He falls asleep easily, he gets 6.5 hours at night and takes a couple of naps in the daytime (this was occurring even before surgery). His wife feels it is also due to frequent urination. He denies any headaches, dizziness, focal numbness/tingling/weakness. His right knee is still a little stiff, he has 2 more PT sessions.    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 His wife James Tate  is present to provide additional history. He feels his memory is okay, thinks it could be better. Sometimes he cannot remember things from year ago, or yesterday. He and his wife started noticing changes after his stroke in 2016. He was at [facility] at that time, records unavailable for review, he had left-sided weakness that resolved. James Tate notes that he was having a little memory change prior to the stroke such as inability to recall much of his childhood or their dating/marriage, but it became more pronounced that he would forget what she had said. He has always "not a conversation initiator," but he would not recall parts of their decision-making conversations or that he had seen a TV show 3 days prior. He denies getting lost driving, his wife denies any driving concerns. He denies forgetting medications regularly, occasionally he may forget his aspirin . He denies missing bill payments, James Tate notes he is very structured. He denies leaving the stove on or misplacing things regularly (except his glasses sometimes). He states his mood is pretty good. James Tate notes he is very calm and "low-key," a little more pronounced now that sometimes she wonders if he is a little depressed. No paranoia or hallucinations. Sleep is overall good with his CPAP, he has rare times that he would "bop" her in his sleep, 1-2 times a year. James Tate has noticed he has tremors when writing, but none when drinking from a cup or using utensils. He denies any headaches, dizziness, diplopia, dysarthria, dysphagia, neck/back pain, focal numbness/tingling/weakness, bowel/bladder dysfunction, anosmia, no falls. He is  a retired Careers adviser. His older sister has dementia, his mother had memory issues. He denies any significant head injuries. He drinks alcohol occasionally. He is pretty active and plays golf.  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   Flat feet    Glaucoma    left eye   History of colon polyps    History of hyperparathyroidism    Melanoma of back (HCC)    MR (mitral regurgitation) 05/16/2018   Moderate noted on ECHO   MVP (mitral valve prolapse) 05/16/2018   Noted on ECHO   OSA on CPAP    cpap set on 4   Osteopenia    resolved with medical therapy (calcium  and fosamax)   PFO (patent foramen ovale)    Sensorineural hearing loss (SNHL) of both ears    Does Not wear HAs    MEDICATIONS: Current Outpatient Medications on File Prior to Visit  Medication Sig Dispense Refill   APIXABAN  (ELIQUIS ) VTE STARTER PACK (10MG  AND 5MG ) Take as directed on package: start with two-5mg  tablets twice daily for 7 days. On day 8, switch to one-5mg  tablet twice daily. 74 each 0   bisacodyl  (DULCOLAX) 5 MG EC tablet Take 1 tablet (5 mg total) by mouth daily as needed for moderate constipation. 30 tablet 0   buPROPion  ER (WELLBUTRIN  SR) 100 MG 12 hr tablet Take 100 mg by mouth at bedtime.     Cholecalciferol  (D2000 ULTRA STRENGTH) 50 MCG (2000 UT) CAPS Take 2,000 Units by mouth daily.     cyanocobalamin  (VITAMIN B12) 1000 MCG tablet Take 1,000 mcg by mouth every morning.     diphenhydramine -acetaminophen  (TYLENOL  PM) 25-500 MG TABS tablet Take 2 tablets by mouth at bedtime.     inclisiran (LEQVIO ) 284 MG/1.5ML SOSY injection Inject at month 0, 3 and every 6 months thereafter     Multiple Minerals-Vitamins (CAL-MAG-ZINC -D PO) Take 1 tablet by mouth daily.     Multiple Vitamin (MULTIVITAMIN) capsule Take 1 capsule by mouth daily.     Omega-3 Fatty Acids (OMEGA 3 PO) Take 1 capsule by mouth every morning.     oxyCODONE  (ROXICODONE ) 5 MG immediate release tablet Take 1 tablet (5 mg total) by mouth every 4 (four) hours as needed for severe pain (pain score 7-10). 30 tablet 0   polyethylene glycol (MIRALAX  / GLYCOLAX ) 17 g packet Take 17 g by mouth daily. 14 each 0   tadalafil (CIALIS) 20 MG tablet Take 20 mg  by mouth daily as needed for erectile dysfunction.     tiZANidine  (ZANAFLEX ) 2 MG tablet Take 1 tablet (2 mg total) by mouth every 6 (six) hours as needed for muscle spasms. 60 tablet 0   triamcinolone (KENALOG) 0.1 % paste Use as directed 1 application in the mouth or throat 2 (two) times daily as needed (mouth sores).     Vitamin K , Phytonadione , 100 MCG TABS Take 100 mcg by mouth daily.      No current facility-administered medications on file prior to visit.    ALLERGIES: Allergies  Allergen Reactions   Atorvastatin  Other (See Comments)    Aches in legs   Codeine Other (See Comments)    Headache.    Erythromycin     Childhood allergy - unknown   Rosuvastatin  Calcium      Memory problem (03/2020) Aches in   legs  FAMILY HISTORY: Family History  Problem Relation Age of Onset   Heart Problems Mother        valve replacement & pacemaker   Osteoporosis Father    Other Maternal Grandfather        poss MI, per pt    SOCIAL HISTORY: Social History   Socioeconomic History   Marital status: Married    Spouse name: Not on file   Number of children: Not on file   Years of education: Not on file   Highest education level: Not on file  Occupational History   Not on file  Tobacco Use   Smoking status: Former    Current packs/day: 0.00    Average packs/day: 1 pack/day for 6.0 years (6.0 ttl pk-yrs)    Types: Cigarettes    Start date: 09/26/1965    Quit date: 09/27/1971    Years since quitting: 52.5   Smokeless tobacco: Never  Vaping Use   Vaping status: Never Used  Substance and Sexual Activity   Alcohol use: Yes    Alcohol/week: 2.0 standard drinks of alcohol    Types: 2 Glasses of wine per week    Comment: 1 or  2 glasses per week   Drug use: No   Sexual activity: Not Currently  Other Topics Concern   Not on file  Social History Narrative   Pt lives in [place] with spouse.  Retired Catering manager for [employer].  Right handed    Social Drivers of Health    Financial Resource Strain: Not on file  Food Insecurity: No Food Insecurity (01/26/2024)   Hunger Vital Sign    Worried About Running Out of Food in the Last Year: Never true    Ran Out of Food in the Last Year: Never true  Transportation Needs: No Transportation Needs (01/26/2024)   PRAPARE - Administrator, Civil Service (Medical): No    Lack of Transportation (Non-Medical): No  Physical Activity: Not on file  Stress: Not on file  Social Connections: Socially Isolated (01/26/2024)   Social Connection and Isolation Panel [NHANES]    Frequency of Communication with Friends and Family: Once a week    Frequency of Social Gatherings with Friends and Family: Once a week    Attends Religious Services: Never    Database administrator or Organizations: No    Attends Banker Meetings: Never    Marital Status: Married  Catering manager Violence: Not At Risk (01/26/2024)   Humiliation, Afraid, Rape, and Kick questionnaire    Fear of Current or Ex-Partner: No    Emotionally Abused: No    Physically Abused: No    Sexually Abused: No     PHYSICAL EXAM: Vitals:   04/06/24 1524  BP: 121/72  Pulse: 85  SpO2: 94%   General: No acute distress Head:  Normocephalic/atraumatic Skin/Extremities: No rash, no edema Neurological Exam: alert and awake. No aphasia or dysarthria. Fund of knowledge is appropriate.  Attention and concentration are normal.   Cranial nerves: Pupils equal, round. Extraocular movements intact with no nystagmus. Visual fields full.  No facial asymmetry.  Motor: Bulk and tone normal, muscle strength 5/5 throughout with no pronator drift.   Finger to nose testing intact.  Gait narrow-based and steady, slightly favoring right leg, no ataxia.    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 MRI brain showed  mild diffuse atrophy and chronic microvascular disease. MoCA 28/30 in 08/2023, he is scheduled for Neurocognitive testing next week. He overall manages complex tasks adequately. Continue control of vascular risk factors, physical exercise, brain stimulation exercises for overall brain health. I will follow-up on Neuropsych recommendations. Follow-up in 6 months, call for any changes.   Thank you for allowing me to participate in his care.  Please do not hesitate to call for any questions or concerns.    Rayfield Cairo, M.D.   CC: Dr. Claudius Cumins

## 2024-04-06 NOTE — Patient Instructions (Signed)
 Good to see you! Proceed with Neurocognitive testing as scheduled next week. Follow-up in 6 months, call for any changes.     RECOMMENDATIONS FOR ALL PATIENTS WITH MEMORY PROBLEMS: 1. Continue to exercise (Recommend 30 minutes of walking everyday, or 3 hours every week) 2. Increase social interactions - continue going to [place] and enjoy social gatherings with friends and family 3. Eat healthy, avoid fried foods and eat more fruits and vegetables 4. Maintain adequate blood pressure, blood sugar, and blood cholesterol level. Reducing the risk of stroke and cardiovascular disease also helps promoting better memory. 5. Avoid stressful situations. Live a simple life and avoid aggravations. Organize your time and prepare for the next day in anticipation. 6. Sleep well, avoid any interruptions of sleep and avoid any distractions in the bedroom that may interfere with adequate sleep quality 7. Avoid sugar, avoid sweets as there is a strong link between excessive sugar intake, diabetes, and cognitive impairment The Mediterranean diet has been shown to help patients reduce the risk of progressive memory disorders and reduces cardiovascular risk. This includes eating fish, eat fruits and green leafy vegetables, nuts like almonds and hazelnuts, walnuts, and also use olive oil. Avoid fast foods and fried foods as much as possible. Avoid sweets and sugar as sugar use has been linked to worsening of memory function.

## 2024-04-10 ENCOUNTER — Encounter: Payer: Self-pay | Admitting: Psychology

## 2024-04-10 DIAGNOSIS — M25661 Stiffness of right knee, not elsewhere classified: Secondary | ICD-10-CM | POA: Diagnosis not present

## 2024-04-10 DIAGNOSIS — Z96651 Presence of right artificial knee joint: Secondary | ICD-10-CM | POA: Diagnosis not present

## 2024-04-11 ENCOUNTER — Ambulatory Visit: Payer: Self-pay

## 2024-04-11 ENCOUNTER — Ambulatory Visit (INDEPENDENT_AMBULATORY_CARE_PROVIDER_SITE_OTHER): Payer: Medicare Other | Admitting: Psychology

## 2024-04-11 ENCOUNTER — Encounter: Payer: Self-pay | Admitting: Psychology

## 2024-04-11 DIAGNOSIS — I639 Cerebral infarction, unspecified: Secondary | ICD-10-CM | POA: Diagnosis not present

## 2024-04-11 DIAGNOSIS — R4189 Other symptoms and signs involving cognitive functions and awareness: Secondary | ICD-10-CM | POA: Diagnosis not present

## 2024-04-11 NOTE — Progress Notes (Signed)
   Psychometrician Note   Cognitive testing was administered to James Tate by Arnulfo Larch, B.S. (psychometrist) under the supervision of Dr. Melville Stade, Ph.D., ABPP, licensed psychologist on 04/11/2024. [name] did not appear overtly distressed by the testing session per behavioral observation or responses across self-report questionnaires. Rest breaks were offered.    The battery of tests administered was selected by Dr. Melville Stade, Ph.D., ABPP with consideration to [name] current level of functioning, the nature of his symptoms, emotional and behavioral responses during interview, level of literacy, observed level of motivation/effort, and the nature of the referral question. This battery was communicated to the psychometrist. Communication between Dr. Melville Stade, Ph.D., ABPP and the psychometrist was ongoing throughout the evaluation and Dr. Melville Stade, Ph.D., ABPP was immediately accessible at all times. Dr. Zachary C. Merz, Ph.D., ABPP provided supervision to the psychometrist on the date of this service to the extent necessary to assure the quality of all services provided.    James Tate will return within approximately 1-2 weeks for an interactive feedback session with Dr. Kitty Perkins at which time his test performances, clinical impressions, and treatment recommendations will be reviewed in detail. [name] understands he can contact our office should he require our assistance before this time.  A total of 150 minutes of billable time were spent face-to-face with [name] by the psychometrist. This includes both test administration and scoring time. Billing for these services is reflected in the clinical report generated by Dr. Melville Stade, Ph.D., ABPP  This note reflects time spent with the psychometrician and does not include test scores or any clinical interpretations made by Dr. Kitty Perkins. The full report will follow in a separate note.

## 2024-04-11 NOTE — Progress Notes (Signed)
 NEUROPSYCHOLOGICAL EVALUATION [place] Department of Neurology  Date of Evaluation: Apr 11, 2024  Reason for Referral:   James Tate is a 78 y.o. right-handed Caucasian male referred by Rayfield Cairo, M.D., to characterize his current cognitive functioning and assist with diagnostic clarity and treatment planning in the context of subjective cognitive decline.   Assessment and Plan:   Clinical Impression(s): James Tate pattern of performance is suggestive of neuropsychological functioning within normal limits relative to age-matched peers. He did exhibit an isolated weakness learning and later recalling a previously learned list of words after a delay. However, his overall retention rate was adequate (75%), performance across a yes/no list-based recognition trial was very strong, and performance was fully intact across three other memory tasks, thus not suggestive of any consistent impairment. Performances were appropriate relative to age-matched peers across processing speed, attention/concentration, executive functioning, receptive and expressive language, and visuospatial abilities, Functionally, James Tate denied difficulties completing instrumental activities of daily living (ADLs) independently. He does not warrant consideration for a neurocognitive disorder at the present time.   Specific to memory, James Tate was able to learn novel verbal and visual information efficiently and retain this knowledge after lengthy delays. Overall, memory performance combined with intact performances across other areas of cognitive functioning is not suggestive of presently symptomatic Alzheimer's disease. Likewise, his cognitive and behavioral profile is not suggestive of any other form of neurodegenerative illness presently. Observed changes and day-to-day dysfunction may be caused by his stroke history, chronic medical ailments, and hearing loss, superimposed on the  normal aging process.  Recommendations: Should James Tate report concern for progressive cognitive decline in the future, a repeat evaluation could be considered at that time.   I would recommend that he complete a formal audiologic evaluation to assess hearing loss. There have been previous studies to suggest a relationship between uncorrected hearing loss and dementia.   James Tate is encouraged to attend to lifestyle factors for brain health (e.g., regular physical exercise, good nutrition habits and consideration of the MIND-DASH diet, regular participation in cognitively-stimulating activities, and general stress management techniques), which are likely to have benefits for both emotional adjustment and cognition. Optimal control of vascular risk factors (including safe cardiovascular exercise and adherence to dietary recommendations) is encouraged. Likewise, continued compliance with his CPAP machine will also be important. Continued participation in activities which provide mental stimulation and social interaction is also recommended.   If interested, there are some activities which have therapeutic value and can be useful in keeping him cognitively stimulated. For suggestions, James Tate is encouraged to go to the following website: https://www.[facility].org/get-to-know-barrow/centers-programs/neurorehabilitation-center/neuro-rehab-apps-and-games/ which has options, categorized by level of difficulty. It should be noted that these activities should not be viewed as a substitute for therapy.  Memory can be improved using internal strategies such as rehearsal, repetition, chunking, mnemonics, association, and imagery. External strategies such as written notes in a consistently used memory journal, visual and nonverbal auditory cues such as a calendar on the refrigerator or appointments with alarm, such as on a cell phone, can also help maximize recall.    When learning new information, he  would benefit from information being broken up into small, manageable pieces. He may also find it helpful to articulate the material in his own words and in a context to promote encoding at the onset of a new task. This material may need to be repeated multiple times to promote encoding.  Because he shows better recall for  structured information, he will likely understand and retain new information better if it is presented to him in a meaningful or well-organized manner at the outset, such as grouping items into meaningful categories or presenting information in an outlined, bulleted, or story format.  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   2022 MRI - small chronic infarcts within the bilateral cerebellar hemispheres   Degenerative arthritis of right knee 01/23/2024   Erectile dysfunction 03/22/2022   Flat feet    Glaucoma    left eye   History of colonic polyps 03/22/2022   History of hyperparathyroidism    History of knee surgery 01/2024   History of malignant melanoma 03/22/2022   melanoma on back   Hypercholesterolemia 03/22/2022   Ileus 01/26/2024   Increased bilirubin level 03/22/2022   Malignant neoplasm of prostate 03/21/2022   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   MR (mitral regurgitation) 05/16/2018   Moderate noted on ECHO   MVP (mitral valve prolapse) 05/16/2018   Noted on ECHO   Obstructive sleep apnea 03/22/2022   Osteopenia    resolved with medical therapy (calcium  and fosamax)   PFO (patent foramen ovale)    Primary osteoarthritis of left knee 12/22/2018   Sensorineural hearing loss (SNHL) of both ears    Does not wear HAs   Transient ischemic attack (TIA) 03/22/2022    Past Surgical History:  Procedure Laterality Date   APPLICATION OF A-CELL  OF BACK N/A 12/19/2014   Procedure: APPLICATION OF A-CELL OF BACK;  Surgeon: Marilou Showman, DO;  Location: [facility];  Service: Plastics;  Laterality: N/A;   CHOLECYSTECTOMY  1996   COLONOSCOPY     EP IMPLANTABLE DEVICE N/A 07/02/2015   Procedure: Loop Recorder Insertion;  Surgeon: Jolly Needle, MD;  Location: [facility];  Service: Cardiovascular;  Laterality: N/A;   GOLD SEED IMPLANT N/A 07/29/2022   Procedure: GOLD SEED IMPLANT;  Surgeon: Florencio Hunting, MD;  Location: [facility];  Service: Urology;  Laterality: N/A;  ONLY NEEDS 30 MIN   implantable loop recorder removal  12/13/2018   MDT LINQ removed in office by Dr Nunzio Belch   LASIK  2003   MELANOMA EXCISION N/A 10/04/2014   Procedure: WIDE LOCAL EXCISION OF UPPER BACK MELANOMA ;  Surgeon: Fran Imus, MD;  Location: [facility];  Service: General;  Laterality: N/A;   PARATHYROIDECTOMY  2004   SPACE OAR INSTILLATION N/A 07/29/2022   Procedure: SPACE OAR INSTILLATION;  Surgeon: Florencio Hunting, MD;  Location: [facility];  Service: Urology;  Laterality: N/A;   TEE WITHOUT CARDIOVERSION N/A 06/12/2015   Procedure: TRANSESOPHAGEAL ECHOCARDIOGRAM (TEE);  Surgeon: Loyde Rule, MD;  [facility];  Service: Cardiovascular;  Laterality: N/A;   TONSILLECTOMY     as child   TOTAL KNEE ARTHROPLASTY Left 12/22/2018   Procedure: TOTAL KNEE ARTHROPLASTY;  Surgeon: Neil Balls, MD;  Location: [facility];  Service: Orthopedics;  Laterality: Left;   TOTAL KNEE ARTHROPLASTY Right 01/23/2024   Procedure: RIGHT TOTAL KNEE ARTHROPLASTY;  Surgeon: Neil Balls, MD;  Location: [facility];  Service: Orthopedics;  Laterality: Right;    Current Outpatient Medications:    APIXABAN  (ELIQUIS ) VTE STARTER PACK (10MG  AND 5MG ), Take as directed on package:  start with two-5mg  tablets twice daily for 7 days. On day 8, switch to one-5mg  tablet twice daily., Disp: 74 each, Rfl: 0   bisacodyl  (DULCOLAX) 5 MG EC tablet,  Take 1 tablet (5 mg total) by mouth daily as needed for moderate constipation., Disp: 30 tablet, Rfl: 0   buPROPion  ER (WELLBUTRIN  SR) 100 MG 12 hr tablet, Take 100 mg by mouth at bedtime. (Patient not taking: Reported on 04/06/2024), Disp: , Rfl:    Cholecalciferol  (D2000 ULTRA STRENGTH) 50 MCG (2000 UT) CAPS, Take 2,000 Units by mouth daily., Disp: , Rfl:    cyanocobalamin  (VITAMIN B12) 1000 MCG tablet, Take 1,000 mcg by mouth every morning., Disp: , Rfl:    diphenhydramine -acetaminophen  (TYLENOL  PM) 25-500 MG TABS tablet, Take 2 tablets by mouth at bedtime., Disp: , Rfl:    inclisiran (LEQVIO ) 284 MG/1.5ML SOSY injection, Inject at month 0, 3 and every 6 months thereafter, Disp: , Rfl:    Multiple Minerals-Vitamins (CAL-MAG-ZINC -D PO), Take 1 tablet by mouth daily., Disp: , Rfl:    Multiple Vitamin (MULTIVITAMIN) capsule, Take 1 capsule by mouth daily., Disp: , Rfl:    Omega-3 Fatty Acids (OMEGA 3 PO), Take 1 capsule by mouth every morning., Disp: , Rfl:    oxyCODONE  (ROXICODONE ) 5 MG immediate release tablet, Take 1 tablet (5 mg total) by mouth every 4 (four) hours as needed for severe pain (pain score 7-10)., Disp: 30 tablet, Rfl: 0   polyethylene glycol (MIRALAX  / GLYCOLAX ) 17 g packet, Take 17 g by mouth daily., Disp: 14 each, Rfl: 0   tadalafil (CIALIS) 20 MG tablet, Take 20 mg by mouth daily as needed for erectile dysfunction., Disp: , Rfl:    tiZANidine  (ZANAFLEX ) 2 MG tablet, Take 1 tablet (2 mg total) by mouth every 6 (six) hours as needed for muscle spasms. (Patient not taking: Reported on 04/06/2024), Disp: 60 tablet, Rfl: 0   triamcinolone (KENALOG) 0.1 % paste, Use as directed 1 application in the mouth or throat 2 (two) times daily as needed (mouth sores)., Disp: , Rfl:    Vitamin K , Phytonadione , 100 MCG TABS, Take 100 mcg by mouth daily. , Disp: , Rfl:       08/29/2023   10:00 AM 08/05/2021   10:00 AM  Montreal Cognitive Assessment   Visuospatial/ Executive (0/5) 5 5  Naming (0/3)  3 3  Attention: Read list of digits (0/2) 2 2  Attention: Read list of letters (0/1) 1 1  Attention: Serial 7 subtraction starting at 100 (0/3) 3 3  Language: Repeat phrase (0/2) 2 2  Language : Fluency (0/1) 1 1  Abstraction (0/2) 2 1  Delayed Recall (0/5) 3 4  Orientation (0/6) 6 6  Total 28 28  Adjusted Score (based on education)  28   Neuroimaging Medical records suggest a right parietal stroke sustained in 2016. Associated scans were unavailable for review. Brain MRI on 08/07/2021 suggested mild microvascular ischemic disease, mild generalized cerebral and cerebellar atrophy, and small chronic infarcts within the bilateral cerebellar hemispheres.   Clinical Interview:   The following information was obtained during a clinical interview with James Tate and his wife prior to cognitive testing.  Cognitive Symptoms: Decreased short-term memory: Endorsed. Examples primarily surrounded trouble recalling recently provided information such as details of recent conversations. He and his wife also reported increased repetition in day-to-day conversation.  Decreased long-term memory: Endorsed. He reported greater difficulties recalling distant memories, especially those surrounding his childhood.  Decreased attention/concentration: Denied. His wife described periods  of hyperfocus or tunnel vision rather than reduced attentional abilities.  Reduced processing speed: Endorsed. Difficulties with executive functions: Endorsed. He reported a longstanding weakness surrounding multi-tasking. His wife noted some trouble with decision making in that, if they are choosing between several options, he may not recall all the options or information used to make an informed decision. They denied trouble with impulsivity or disinhibition.  Difficulties with emotion regulation: Denied. They also denied any prominent personality changes.  Difficulties with receptive language: Denied. Difficulties with word  finding: Denied. Decreased visuoperceptual ability: Denied.  Trajectory of deficits: Mild memory difficulties were said to potentially be present prior to his 2016 stroke. However, both he and his wife reported greater memory difficulties following this event. While both acknowledged persisting difficulties to present day, his wife also expressed concern for mild progressive decline.   Difficulties completing ADLs: Denied. His wife noted some driving concerns surrounding depth perception (e.g., more frequently hitting curbs while turning). No safety or prominent navigational concerns were noted.    Additional Medical History: History of traumatic brain injury/concussion: Denied. History of stroke: See neuroimaging above.  History of seizure activity: Denied. History of known exposure to toxins: Denied. Symptoms of chronic pain: Denied. Experience of frequent headaches/migraines: Denied. Frequent instances of dizziness/vertigo: Denied.  Sensory changes: He reported bilateral hearing loss, noting that he likely needs to have his hearing formally evaluated and be fitted for hearing aids. He utilizes reading glasses with benefit and noted a longstanding diminished sense of smell.  Balance/coordination difficulties: Denied. He also denied any recent falls.  Other motor difficulties: Largely denied. Some action tremors are observed when writing. His wife noted that his handwriting has gotten messy and smaller in size. Tremors are not observed performing any other action.   Sleep History: Estimated hours obtained each night: Unclear.  Difficulties falling asleep: Denied. Difficulties staying asleep: Endorsed. He recently underwent a knee replacement procedure. During his recovery, he was unable to utilize his CPAP machine, subsequently reducing sleep quality to the point he was waking every 1-2 hours. This has improved back to typical levels since resuming CPAP use more recently. His wife also noted  some waking throughout the night to use the restroom since being treated for prostate cancer. She noted that if he is awoken in the early morning hours, he may have trouble falling back asleep.  Feels rested and refreshed upon awakening: Endorsed. However, he generally will nap during the day.   History of snoring: Endorsed. History of waking up gasping for air: Endorsed. Witnessed breath cessation while asleep: Endorsed. He has a history of obstructive sleep apnea and utilizes his CPAP machine nightly.   History of vivid dreaming: Denied. Excessive movement while asleep: Denied. Instances of acting out his dreams: Denied.  Psychiatric/Behavioral Health History: Depression: He described his current mood as "fairly good" and denied to his knowledge any prior mental health concerns or formal diagnoses. Current or remote suicidal ideation, intent, or plan was denied.  Anxiety: Denied. Mania: Denied. Trauma History: Denied. Visual/auditory hallucinations: Denied. Delusional thoughts: Denied.  Tobacco: Denied. Alcohol: He reported infrequent alcohol consumption and denied a history of problematic alcohol abuse or dependence.  Recreational drugs: Denied.  Family History: Problem Relation Age of Onset   Heart Problems Mother        valve replacement & pacemaker   Depression Mother    Osteoporosis Father    Alzheimer's disease Sister    Other Maternal Grandfather        poss MI,  per pt   Parkinson's disease Paternal Grandfather    This information was confirmed by James Tate.  Academic/Vocational History: Highest level of educational attainment: 18 years. He earned an Set designer and described himself as a good (A/B) student in academic settings. No relative weaknesses outside of foreign language courses were noted.  History of developmental delay: Denied. History of grade repetition: Denied. Enrollment in special education courses: Denied. History of LD/ADHD: Denied.  Employment:  Retired. He previously worked as a Careers adviser.   Evaluation Results:   Behavioral Observations: James Tate was accompanied by his wife, arrived to his appointment on time, and was appropriately dressed and groomed. He appeared alert. Observed gait and station were within normal limits. Gross motor functioning appeared intact upon informal observation and no abnormal movements (e.g., tremors) were noted. His affect was generally relaxed and positive. Spontaneous speech was fluent and word finding difficulties were not observed during the clinical interview. Thought processes were coherent, organized, and normal in content. Insight into his cognitive difficulties appeared adequate.   During testing, sustained attention was appropriate. Task engagement was adequate and he persisted when challenged. Overall, James Tate was cooperative with the clinical interview and subsequent testing procedures.   Adequacy of Effort: The validity of neuropsychological testing is limited by the extent to which the individual being tested may be assumed to have exerted adequate effort during testing. James Tate expressed his intention to perform to the best of his abilities and exhibited adequate task engagement and persistence. Scores across stand-alone and embedded performance validity measures were within expectation. As such, the results of the current evaluation are believed to be a valid representation of James Tate current cognitive functioning.  Test Results: James Tate was fully oriented at the time of the current evaluation.  Intellectual abilities based upon educational and vocational attainment were estimated to be in the average to above average range. Premorbid abilities were estimated to be within the above average range based upon a single-word reading test. His full scale IQ calculation was also in the above average range.    Processing speed was average to above average. Basic attention was  exceptionally high. More complex attention (e.g., working memory) was above average. Executive functioning was average to well above average.  While not directly assessed, receptive language abilities were believed to be intact. James Tate did not exhibit any difficulties comprehending task instructions and answered all questions asked of him appropriately. Assessed expressive language (e.g., verbal fluency and confrontation naming) was average to above average.     Assessed visuospatial/visuoconstructional abilities were average to exceptionally high.    Learning (i.e., encoding) of novel verbal and visual information was above average to well above average. Spontaneous delayed recall (i.e., retrieval) of previously learned information was well below average across a list based task but average to well above average across all other tasks. Retention rates were 75% across a list learning task, 90% across a story learning task, 88% across a daily living task, and 114% across a shape learning task. Performance across recognition tasks was average to well above average, suggesting evidence for information consolidation.   Results of emotional screening instruments suggested that recent symptoms of generalized anxiety were in the minimal range, while symptoms of depression were within normal limits. A screening instrument assessing recent sleep quality suggested the presence of minimal sleep dysfunction.  Table of Scores:   Note: This summary of test scores accompanies the interpretive report and should not be considered in isolation without reference  to the appropriate sections in the text. Descriptors are based on appropriate normative data and may be adjusted based on clinical judgment. Terms such as "Within Normal Limits" and "Outside Normal Limits" are used when a more specific description of the test score cannot be determined.       Percentile - Normative Descriptor > 98 - Exceptionally  High 91-97 - Well Above Average 75-90 - Above Average 25-74 - Average 9-24 - Below Average 2-8 - Well Below Average < 2 - Exceptionally Low       Validity:   DESCRIPTOR       DCT: --- --- Within Normal Limits       Orientation:      Raw Score Percentile   NAB Orientation, Form 1 29/29 --- ---       Cognitive Screening:      Raw Score Percentile   SLUMS: 26/30 --- ---       Intellectual Functioning:      Standard Score Percentile   Test of Premorbid Functioning: 112 79 Above Average       Wechsler Adult Intelligence Scale (WAIS-5):  Standard Score/ Scaled Score Percentile   Full Scale IQ  116 86 Above Average  General Abilities Index 114 82 Above Average    Similarities  11 63 Average    Vocabulary 12 75 Above Average    Block Design  16 98 Exceptionally High    Matrix Reasoning 10 50 Average    Figure Weights 12 75 Above Average    Digit Sequencing 13 84 Above Average    Coding 12 75 Above Average       Memory:     NAB Memory Module, Form 1: Standard Score/ T Score Percentile   Total Memory Index 108 70 Average  List Learning       Total Trials 1-3 26/36 (58) 79 Above Average    List B 4/12 (49) 46 Average    Short Delay Free Recall 4/12 (35) 7 Well Below Average    Long Delay Free Recall 3/12 (32) 4 Well Below Average    Retention Percentage 75 (44) 27 Average    Recognition Discriminability 11 (65) 93 Well Above Average  Shape Learning       Total Trials 1-3 18/27 (59) 82 Above Average    Delayed Recall 8/9 (69) 97 Well Above Average    Retention Percentage 114 (53) 62 Average    Recognition Discriminability 6 (49) 46 Average  Story Learning       Immediate Recall 69/80 (60) 84 Above Average    Delayed Recall 35/40 (57) 75 Above Average    Retention Percentage 90 (52) 58 Average  Daily Living Memory       Immediate Recall 47/51 (63) 91 Well Above Average    Delayed Recall 15/17 (56) 73 Average    Retention Percentage 88 (52) 58 Average    Recognition Hits  10/10 (61) 86 Above Average       Attention/Executive Function:     Trail Making Test (TMT): Raw Score (T Score) Percentile     Part A 26 secs.,  1 error (56) 73 Average    Part B 49 secs.,  1 error (64) 92 Well Above Average         Scaled Score Percentile   WAIS-5 Coding: 12 75 Above Average  WAIS-5 Naming Speed Quantity: 9 37 Average        Scaled Score Percentile   WAIS-5 Digits Forwards: 16 98  Exceptionally High  WAIS-5 Digit Sequencing: 13 84 Above Average        Scaled Score Percentile   WAIS-5 Similarities: 11 63 Average  WAIS-5 Matrix Reasoning: 10 50 Average  WAIS-5 Figure Weights: 12 75 Above Average       D-KEFS Verbal Fluency Test: Raw Score (Scaled Score) Percentile     Letter Total Correct 39 (11) 63 Average    Category Total Correct 37 (12) 75 Above Average    Category Switching Total Correct 13 (11) 63 Average    Category Switching Accuracy 12 (11) 63 Average      Total Set Loss Errors 1 (11) 63 Average      Total Repetition Errors 1 (12) 75 Above Average       Language:     Verbal Fluency Test: Raw Score (T Score) Percentile     Phonemic Fluency (FAS) 39 (47) 38 Average    Animal Fluency 18 (46) 34 Average        NAB Language Module, Form 1: T Score Percentile     Naming 31/31 (59) 82 Above Average       Visuospatial/Visuoconstruction:      Raw Score Percentile   Clock Drawing: 10/10 --- Within Normal Limits       NAB Spatial Module, Form 1: T Score Percentile     Figure Drawing Copy 71 98 Exceptionally High        Scaled Score Percentile   WAIS-5 Block Design: 16 98 Exceptionally High       Mood and Personality:      Raw Score Percentile   Geriatric Depression Scale: 1 --- Within Normal Limits  Geriatric Anxiety Scale: 3 --- Minimal    Somatic 3 --- Minimal    Cognitive 0 --- Minimal    Affective 0 --- Minimal       Additional Questionnaires:      Raw Score Percentile   PROMIS Sleep Disturbance Questionnaire: 18 --- None to Slight    Informed Consent and Coding/Compliance:   The current evaluation represents a clinical evaluation for the purposes previously outlined by the referral source and is in no way reflective of a forensic evaluation.   James Tate was provided with a verbal description of the nature and purpose of the present neuropsychological evaluation. Also reviewed were the foreseeable risks and/or discomforts and benefits of the procedure, limits of confidentiality, and mandatory reporting requirements of this provider. The patient was given the opportunity to ask questions and receive answers about the evaluation. Oral consent to participate was provided by the patient.   This evaluation was conducted by Melville Stade, Ph.D., ABPP-CN, board certified clinical neuropsychologist. James Tate completed a clinical interview with Dr. Kitty Perkins, billed as one unit [phone], and 150 minutes of cognitive testing and scoring, billed as one unit [phone] and four additional units 96139. Psychometrist Arnulfo Larch, B.S. assisted Dr. Kitty Perkins with test administration and scoring procedures. As a separate and discrete service, one unit [phone] and two units 96133 (160 minutes) were billed for Dr. Arsenio Larger time spent in interpretation and report writing.

## 2024-04-12 DIAGNOSIS — Z96651 Presence of right artificial knee joint: Secondary | ICD-10-CM | POA: Diagnosis not present

## 2024-04-12 DIAGNOSIS — M25661 Stiffness of right knee, not elsewhere classified: Secondary | ICD-10-CM | POA: Diagnosis not present

## 2024-04-18 ENCOUNTER — Ambulatory Visit (INDEPENDENT_AMBULATORY_CARE_PROVIDER_SITE_OTHER): Admitting: Psychology

## 2024-04-18 DIAGNOSIS — I639 Cerebral infarction, unspecified: Secondary | ICD-10-CM | POA: Diagnosis not present

## 2024-04-18 DIAGNOSIS — R4189 Other symptoms and signs involving cognitive functions and awareness: Secondary | ICD-10-CM | POA: Diagnosis not present

## 2024-04-18 NOTE — Progress Notes (Signed)
   Neuropsychology Feedback Session Tommas Fragmin. [facility] [place] Department of Neurology  Reason for Referral:   James Tate is a 78 y.o. right-handed Caucasian male referred by Rayfield Cairo, M.D., to characterize his current cognitive functioning and assist with diagnostic clarity and treatment planning in the context of subjective cognitive decline.  Feedback:   James Tate completed a comprehensive neuropsychological evaluation on 04/11/2024. Please refer to that encounter for the full report and recommendations. Briefly, results suggested neuropsychological functioning within normal limits relative to age-matched peers. He did exhibit an isolated weakness learning and later recalling a previously learned list of words after a delay. However, his overall retention rate was adequate (75%), performance across a yes/no list-based recognition trial was very strong, and performance was fully intact across three other memory tasks, thus not suggestive of any consistent impairment. Performances were appropriate relative to age-matched peers across processing speed, attention/concentration, executive functioning, receptive and expressive language, and visuospatial abilities. Overall, memory performance combined with intact performances across other areas of cognitive functioning is not suggestive of presently symptomatic Alzheimer's disease. Observed changes and day-to-day dysfunction may be caused by his stroke history, chronic medical ailments, and hearing loss, superimposed on the normal aging process.  James Tate was accompanied by his wife during the current feedback session. Content of the current session focused on the results of his neuropsychological evaluation. James Tate was given the opportunity to ask questions and his questions were answered. He was encouraged to reach out should additional questions arise. A copy of his report was provided at the conclusion of the visit.       One unit 96132 (31 minutes) was billed for Dr. Arsenio Larger time spent preparing for, conducting, and documenting the current feedback session with James Tate.

## 2024-04-19 ENCOUNTER — Encounter: Payer: Medicare Other | Admitting: Psychology

## 2024-04-26 ENCOUNTER — Encounter: Payer: Medicare Other | Admitting: Psychology

## 2024-05-02 DIAGNOSIS — I2699 Other pulmonary embolism without acute cor pulmonale: Secondary | ICD-10-CM | POA: Diagnosis not present

## 2024-05-02 DIAGNOSIS — R7303 Prediabetes: Secondary | ICD-10-CM | POA: Diagnosis not present

## 2024-05-02 DIAGNOSIS — G4733 Obstructive sleep apnea (adult) (pediatric): Secondary | ICD-10-CM | POA: Diagnosis not present

## 2024-05-02 DIAGNOSIS — Z8673 Personal history of transient ischemic attack (TIA), 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 residual deficits: Secondary | ICD-10-CM | POA: Diagnosis not present

## 2024-05-02 DIAGNOSIS — Z0000 Encounter for general adult medical examination without abnormal findings: Secondary | ICD-10-CM | POA: Diagnosis not present

## 2024-05-02 DIAGNOSIS — Z129 Encounter for screening for malignant neoplasm, site unspecified: Secondary | ICD-10-CM | POA: Diagnosis not present

## 2024-05-02 DIAGNOSIS — Z1331 Encounter for screening for depression: Secondary | ICD-10-CM | POA: Diagnosis not present

## 2024-05-02 DIAGNOSIS — E782 Mixed hyperlipidemia: Secondary | ICD-10-CM | POA: Diagnosis not present

## 2024-05-02 DIAGNOSIS — C61 Malignant neoplasm of prostate: Secondary | ICD-10-CM | POA: Diagnosis not present

## 2024-05-02 DIAGNOSIS — I700 Atherosclerosis of aorta: Secondary | ICD-10-CM | POA: Diagnosis not present

## 2024-05-03 DIAGNOSIS — C61 Malignant neoplasm of prostate: Secondary | ICD-10-CM | POA: Diagnosis not present

## 2024-05-10 DIAGNOSIS — R351 Nocturia: Secondary | ICD-10-CM | POA: Diagnosis not present

## 2024-05-10 DIAGNOSIS — C61 Malignant neoplasm of prostate: Secondary | ICD-10-CM | POA: Diagnosis not present

## 2024-05-10 DIAGNOSIS — N401 Enlarged prostate with lower urinary tract symptoms: Secondary | ICD-10-CM | POA: Diagnosis not present

## 2024-05-29 DIAGNOSIS — M25561 Pain in right knee: Secondary | ICD-10-CM | POA: Diagnosis not present

## 2024-06-13 ENCOUNTER — Ambulatory Visit

## 2024-06-26 ENCOUNTER — Ambulatory Visit (INDEPENDENT_AMBULATORY_CARE_PROVIDER_SITE_OTHER)

## 2024-06-26 VITALS — BP 128/73 | HR 65 | Temp 97.5°F | Resp 20 | Ht 72.0 in | Wt 189.0 lb

## 2024-06-26 DIAGNOSIS — G459 Transient cerebral ischemic attack, unspecified: Secondary | ICD-10-CM

## 2024-06-26 DIAGNOSIS — E7800 Pure hypercholesterolemia, unspecified: Secondary | ICD-10-CM

## 2024-06-26 MED ORDER — INCLISIRAN SODIUM 284 MG/1.5ML ~~LOC~~ SOSY
284.0000 mg | PREFILLED_SYRINGE | Freq: Once | SUBCUTANEOUS | Status: AC
  Administered 2024-06-26: 284 mg via SUBCUTANEOUS
  Filled 2024-06-26: qty 1.5

## 2024-06-26 NOTE — Progress Notes (Signed)
 Diagnosis: Hypercholesterolemia  Provider:  Mannam, Praveen MD  Procedure: Injection  Leqvio  (inclisiran), Dose: 284 mg, Site: subcutaneous, Number of injections: 1  Injection Site(s): Right arm  Post Care: Patient declined observation  Discharge: Condition: Stable, Destination: Home . AVS Declined  Performed by:  Rocky FORBES Sar, RN

## 2024-07-02 ENCOUNTER — Encounter: Payer: Self-pay | Admitting: Pharmacist

## 2024-07-02 DIAGNOSIS — E7800 Pure hypercholesterolemia, unspecified: Secondary | ICD-10-CM

## 2024-07-06 DIAGNOSIS — E7800 Pure hypercholesterolemia, unspecified: Secondary | ICD-10-CM | POA: Diagnosis not present

## 2024-07-07 LAB — LIPID PANEL
Chol/HDL Ratio: 2.4 ratio (ref 0.0–5.0)
Cholesterol, Total: 121 mg/dL (ref 100–199)
HDL: 51 mg/dL (ref >39)
LDL Chol Calc (NIH): 44 mg/dL (ref 0–99)
Triglycerides: 156 mg/dL — ABNORMAL HIGH (ref 0–149)
VLDL Cholesterol Cal: 26 mg/dL (ref 5–40)

## 2024-07-09 ENCOUNTER — Ambulatory Visit: Payer: Self-pay | Admitting: Pharmacist

## 2024-07-10 DIAGNOSIS — H401131 Primary open-angle glaucoma, bilateral, mild stage: Secondary | ICD-10-CM | POA: Diagnosis not present

## 2024-07-31 DIAGNOSIS — D225 Melanocytic nevi of trunk: Secondary | ICD-10-CM | POA: Diagnosis not present

## 2024-07-31 DIAGNOSIS — Z08 Encounter for follow-up examination after completed treatment for malignant neoplasm: Secondary | ICD-10-CM | POA: Diagnosis not present

## 2024-07-31 DIAGNOSIS — D492 Neoplasm of unspecified behavior of bone, soft tissue, and skin: Secondary | ICD-10-CM | POA: Diagnosis not present

## 2024-07-31 DIAGNOSIS — R229 Localized swelling, mass and lump, unspecified: Secondary | ICD-10-CM | POA: Diagnosis not present

## 2024-07-31 DIAGNOSIS — L814 Other melanin hyperpigmentation: Secondary | ICD-10-CM | POA: Diagnosis not present

## 2024-07-31 DIAGNOSIS — M25561 Pain in right knee: Secondary | ICD-10-CM | POA: Diagnosis not present

## 2024-07-31 DIAGNOSIS — Z85820 Personal history of malignant melanoma of skin: Secondary | ICD-10-CM | POA: Diagnosis not present

## 2024-07-31 DIAGNOSIS — L821 Other seborrheic keratosis: Secondary | ICD-10-CM | POA: Diagnosis not present

## 2024-09-12 DIAGNOSIS — R351 Nocturia: Secondary | ICD-10-CM | POA: Diagnosis not present

## 2024-09-12 DIAGNOSIS — G4733 Obstructive sleep apnea (adult) (pediatric): Secondary | ICD-10-CM | POA: Diagnosis not present

## 2024-09-14 DIAGNOSIS — Z23 Encounter for immunization: Secondary | ICD-10-CM | POA: Diagnosis not present

## 2024-11-09 DIAGNOSIS — C61 Malignant neoplasm of prostate: Secondary | ICD-10-CM | POA: Diagnosis not present

## 2024-11-12 ENCOUNTER — Ambulatory Visit: Admitting: Neurology

## 2024-11-14 ENCOUNTER — Ambulatory Visit: Admitting: Cardiovascular Disease

## 2024-12-07 ENCOUNTER — Telehealth: Payer: Self-pay | Admitting: Pharmacy Technician

## 2024-12-07 NOTE — Telephone Encounter (Signed)
 Auth Submission: NO AUTH NEEDED Site of care: Site of care: [facility] Payer: MEDICARE A/B & SUPP Medication & CPT/J Code(s) submitted: Leqvio  (Inclisiran) J1306 Diagnosis Code:  Route of submission (phone, fax, portal):  Phone # Fax # Auth type: Buy/Bill PB Units/visits requested: Q6MONTHS X2 DOSES Reference number:  Approval from: 12/07/24 to 12/05/25

## 2025-01-01 ENCOUNTER — Ambulatory Visit

## 2025-01-01 VITALS — BP 123/71 | HR 85 | Temp 97.4°F | Resp 16 | Ht 72.0 in | Wt 192.4 lb

## 2025-01-01 DIAGNOSIS — E7800 Pure hypercholesterolemia, unspecified: Secondary | ICD-10-CM | POA: Diagnosis not present

## 2025-01-01 DIAGNOSIS — G459 Transient cerebral ischemic attack, unspecified: Secondary | ICD-10-CM

## 2025-01-01 MED ORDER — INCLISIRAN SODIUM 284 MG/1.5ML ~~LOC~~ SOSY
284.0000 mg | PREFILLED_SYRINGE | Freq: Once | SUBCUTANEOUS | Status: AC
  Administered 2025-01-01: 284 mg via SUBCUTANEOUS
  Filled 2025-01-01: qty 1.5

## 2025-01-01 NOTE — Progress Notes (Signed)
 Diagnosis: Hyperlipidemia  Provider:  Chilton Greathouse MD  Procedure: Injection  Leqvio (inclisiran), Dose: 284 mg, Site: subcutaneous, Number of injections: 1  Injection Site(s): Left arm  Post Care:  left arm injection  Discharge: Condition: Good, Destination: Home . AVS Declined  Performed by:  Rico Ala, LPN

## 2025-01-09 ENCOUNTER — Ambulatory Visit: Admitting: Cardiovascular Disease

## 2025-01-29 ENCOUNTER — Ambulatory Visit: Admitting: Physician Assistant

## 2025-04-08 ENCOUNTER — Ambulatory Visit: Admitting: Neurology

## 2025-07-03 ENCOUNTER — Ambulatory Visit
